# Patient Record
Sex: Male | Born: 1970 | Race: White | Hispanic: No | Marital: Single | Smoking: Current every day smoker
Health system: Southern US, Community
[De-identification: ages and names within clinical notes are randomized; demographics above are authoritative.]

## PROBLEM LIST (undated history)

## (undated) DIAGNOSIS — I10 Essential (primary) hypertension: Secondary | ICD-10-CM

## (undated) DIAGNOSIS — E119 Type 2 diabetes mellitus without complications: Secondary | ICD-10-CM

## (undated) DIAGNOSIS — F419 Anxiety disorder, unspecified: Secondary | ICD-10-CM

## (undated) HISTORY — PX: NASAL DILATION: SHX2064

## (undated) HISTORY — PX: ROTATOR CUFF REPAIR: SHX139

## (undated) HISTORY — PX: BACK SURGERY: SHX140

---

## 2014-03-30 ENCOUNTER — Other Ambulatory Visit: Payer: Self-pay | Admitting: Neurological Surgery

## 2014-04-11 ENCOUNTER — Encounter (HOSPITAL_COMMUNITY): Payer: Self-pay | Admitting: Pharmacy Technician

## 2014-04-13 ENCOUNTER — Encounter (HOSPITAL_COMMUNITY)
Admission: RE | Admit: 2014-04-13 | Discharge: 2014-04-13 | Disposition: A | Discharge status: Home / Self Care | Payer: BC Managed Care – PPO | Source: Ambulatory Visit | Attending: Neurological Surgery | Admitting: Neurological Surgery

## 2014-04-13 ENCOUNTER — Encounter (HOSPITAL_COMMUNITY): Payer: Self-pay

## 2014-04-13 DIAGNOSIS — Z01812 Encounter for preprocedural laboratory examination: Secondary | ICD-10-CM | POA: Insufficient documentation

## 2014-04-13 DIAGNOSIS — Z01810 Encounter for preprocedural cardiovascular examination: Secondary | ICD-10-CM | POA: Insufficient documentation

## 2014-04-13 HISTORY — DX: Type 2 diabetes mellitus without complications: E11.9

## 2014-04-13 LAB — BASIC METABOLIC PANEL
Anion gap: 12 (ref 5–15)
BUN: 9 mg/dL (ref 6–23)
CO2: 26 mEq/L (ref 19–32)
Calcium: 8.7 mg/dL (ref 8.4–10.5)
Chloride: 102 mEq/L (ref 96–112)
Creatinine, Ser: 0.69 mg/dL (ref 0.50–1.35)
GFR calc Af Amer: >90 mL/min (ref >90)
GFR calc non Af Amer: >90 mL/min (ref >90)
Glucose, Bld: 95 mg/dL (ref 70–99)
Potassium: 4.4 mEq/L (ref 3.7–5.3)
SODIUM: 140 meq/L (ref 137–147)

## 2014-04-13 LAB — SURGICAL PCR SCREEN
MRSA, PCR: NEGATIVE
Staphylococcus aureus: NEGATIVE

## 2014-04-13 LAB — CBC
HEMATOCRIT: 40.3 % (ref 39.0–52.0)
Hemoglobin: 13.5 g/dL (ref 13.0–17.0)
MCH: 33.9 pg (ref 26.0–34.0)
MCHC: 33.5 g/dL (ref 30.0–36.0)
MCV: 101.3 fL — ABNORMAL HIGH (ref 78.0–100.0)
Platelets: 214 10*3/uL (ref 150–400)
RBC: 3.98 MIL/uL — ABNORMAL LOW (ref 4.22–5.81)
RDW: 12.7 % (ref 11.5–15.5)
WBC: 10.1 10*3/uL (ref 4.0–10.5)

## 2014-04-13 LAB — TYPE AND SCREEN
ABO/RH(D): O POS
ANTIBODY SCREEN: NEGATIVE

## 2014-04-13 LAB — ABO/RH: ABO/RH(D): O POS

## 2014-04-13 NOTE — Pre-Procedure Instructions (Signed)
Tilden DomeJerry Zeitlin  04/13/2014   Your procedure is scheduled on: Tuesday, April 19, 2014  [facility] at 8:00 AM.  Call this number if you have problems the morning of surgery: [phone]   Remember:   Do not eat food or drink liquids after midnight Monday, April 18, 2014   Take these medicines the morning of surgery with A SIP OF WATER: PARoxetine (PAXIL) if needed:  acetaminophen (TYLENOL) for moderate pain.  Stop taking Aspirin, vitamins and herbal medications. Do not take any NSAIDs ie: Ibuprofen,  Advil,  Naproxen or any medication containing Aspirin; stop now.   Do not wear jewelry, make-up or nail polish.  Do not wear lotions, powders, or perfumes. You may wear deodorant.  Do not shave 48 hours prior to surgery. Men may shave face and neck.  Do not bring valuables to the hospital.  [facility] is not responsible for any belongings or valuables.               Contacts, dentures or bridgework may not be worn into surgery.  Leave suitcase in the car. After surgery it may be brought to your room.  For patients admitted to the hospital, discharge time is determined by your treatment team.               Patients discharged the day of surgery will not be allowed to drive home.  Name and phone number of your driver:   Special Instructions: Shower using CHG the night before surgery and the morning of surgery.   Please read over the following fact sheets that you were given: Pain Booklet, Coughing and Deep Breathing, Blood Transfusion Information, MRSA Information and Surgical Site Infection Prevention

## 2014-04-18 MED ORDER — CEFAZOLIN SODIUM-DEXTROSE 2-3 GM-% IV SOLR
2.0000 g | INTRAVENOUS | Status: AC
  Administered 2014-04-19 (×3): 2 g via INTRAVENOUS
  Filled 2014-04-18: qty 50

## 2014-04-19 ENCOUNTER — Encounter (HOSPITAL_COMMUNITY)
Admission: RE | Disposition: A | Discharge status: Home / Self Care | Payer: BC Managed Care – PPO | Source: Ambulatory Visit | Attending: Neurological Surgery

## 2014-04-19 ENCOUNTER — Encounter (HOSPITAL_COMMUNITY): Payer: Self-pay | Admitting: Surgery

## 2014-04-19 ENCOUNTER — Inpatient Hospital Stay (HOSPITAL_COMMUNITY): Payer: BC Managed Care – PPO | Admitting: Certified Registered"

## 2014-04-19 ENCOUNTER — Inpatient Hospital Stay (HOSPITAL_COMMUNITY): Payer: BC Managed Care – PPO

## 2014-04-19 ENCOUNTER — Encounter (HOSPITAL_COMMUNITY): Payer: BC Managed Care – PPO | Admitting: Certified Registered"

## 2014-04-19 ENCOUNTER — Inpatient Hospital Stay (HOSPITAL_COMMUNITY)
Admission: RE | Admit: 2014-04-19 | Discharge: 2014-04-23 | DRG: 460 | Disposition: A | Discharge status: Home / Self Care | Payer: BC Managed Care – PPO | Source: Ambulatory Visit | Attending: Neurological Surgery | Admitting: Neurological Surgery

## 2014-04-19 DIAGNOSIS — M5126 Other intervertebral disc displacement, lumbar region: Secondary | ICD-10-CM | POA: Diagnosis present

## 2014-04-19 DIAGNOSIS — F17200 Nicotine dependence, unspecified, uncomplicated: Secondary | ICD-10-CM | POA: Diagnosis present

## 2014-04-19 DIAGNOSIS — E119 Type 2 diabetes mellitus without complications: Secondary | ICD-10-CM | POA: Diagnosis present

## 2014-04-19 DIAGNOSIS — Q762 Congenital spondylolisthesis: Secondary | ICD-10-CM

## 2014-04-19 DIAGNOSIS — Z888 Allergy status to other drugs, medicaments and biological substances status: Secondary | ICD-10-CM

## 2014-04-19 DIAGNOSIS — I10 Essential (primary) hypertension: Secondary | ICD-10-CM | POA: Diagnosis present

## 2014-04-19 DIAGNOSIS — Z23 Encounter for immunization: Secondary | ICD-10-CM

## 2014-04-19 DIAGNOSIS — M47816 Spondylosis without myelopathy or radiculopathy, lumbar region: Secondary | ICD-10-CM | POA: Diagnosis present

## 2014-04-19 DIAGNOSIS — Z885 Allergy status to narcotic agent status: Secondary | ICD-10-CM

## 2014-04-19 DIAGNOSIS — M47817 Spondylosis without myelopathy or radiculopathy, lumbosacral region: Secondary | ICD-10-CM | POA: Diagnosis present

## 2014-04-19 HISTORY — DX: Essential (primary) hypertension: I10

## 2014-04-19 HISTORY — PX: POSTERIOR LUMBAR FUSION 4 LEVEL: SHX6037

## 2014-04-19 LAB — GLUCOSE, CAPILLARY: GLUCOSE-CAPILLARY: 157 mg/dL — AB (ref 70–99)

## 2014-04-19 SURGERY — POSTERIOR LUMBAR FUSION 4 LEVEL
Anesthesia: General | Site: Spine Lumbar

## 2014-04-19 MED ORDER — HYDROMORPHONE HCL PF 1 MG/ML IJ SOLN
0.2500 mg | INTRAMUSCULAR | Status: DC | PRN
  Administered 2014-04-19 (×4): 0.5 mg via INTRAVENOUS

## 2014-04-19 MED ORDER — LACTATED RINGERS IV SOLN
INTRAVENOUS | Status: DC
  Administered 2014-04-19 (×4): via INTRAVENOUS

## 2014-04-19 MED ORDER — KETOROLAC TROMETHAMINE 15 MG/ML IJ SOLN
15.0000 mg | Freq: Four times a day (QID) | INTRAMUSCULAR | Status: AC
  Administered 2014-04-19 – 2014-04-20 (×5): 15 mg via INTRAVENOUS
  Filled 2014-04-19 (×5): qty 1

## 2014-04-19 MED ORDER — ROCURONIUM BROMIDE 50 MG/5ML IV SOLN
INTRAVENOUS | Status: AC
  Filled 2014-04-19: qty 1

## 2014-04-19 MED ORDER — DOCUSATE SODIUM 100 MG PO CAPS
100.0000 mg | ORAL_CAPSULE | Freq: Two times a day (BID) | ORAL | Status: DC
  Administered 2014-04-19 – 2014-04-23 (×8): 100 mg via ORAL
  Filled 2014-04-19 (×9): qty 1

## 2014-04-19 MED ORDER — FENTANYL CITRATE 0.05 MG/ML IJ SOLN
INTRAMUSCULAR | Status: AC
  Filled 2014-04-19: qty 5

## 2014-04-19 MED ORDER — FENTANYL CITRATE 0.05 MG/ML IJ SOLN
INTRAMUSCULAR | Status: DC | PRN
Start: 2014-04-19 — End: 2014-04-19
  Administered 2014-04-19 (×2): 50 ug via INTRAVENOUS
  Administered 2014-04-19: 100 ug via INTRAVENOUS
  Administered 2014-04-19: 50 ug via INTRAVENOUS
  Administered 2014-04-19 (×2): 100 ug via INTRAVENOUS
  Administered 2014-04-19: 50 ug via INTRAVENOUS
  Administered 2014-04-19: 150 ug via INTRAVENOUS
  Administered 2014-04-19 (×2): 50 ug via INTRAVENOUS

## 2014-04-19 MED ORDER — CEFAZOLIN SODIUM 1-5 GM-% IV SOLN
1.0000 g | Freq: Three times a day (TID) | INTRAVENOUS | Status: AC
  Administered 2014-04-20 (×2): 1 g via INTRAVENOUS
  Filled 2014-04-19 (×2): qty 50

## 2014-04-19 MED ORDER — THROMBIN 20000 UNITS EX SOLR
CUTANEOUS | Status: DC | PRN
  Administered 2014-04-19: 13:00:00 via TOPICAL

## 2014-04-19 MED ORDER — PAROXETINE HCL 20 MG PO TABS
40.0000 mg | ORAL_TABLET | ORAL | Status: DC
  Administered 2014-04-20 – 2014-04-23 (×4): 40 mg via ORAL
  Filled 2014-04-19 (×5): qty 2

## 2014-04-19 MED ORDER — PROPOFOL 10 MG/ML IV BOLUS
INTRAVENOUS | Status: DC | PRN
  Administered 2014-04-19: 140 mg via INTRAVENOUS

## 2014-04-19 MED ORDER — SODIUM CHLORIDE 0.9 % IJ SOLN: 3.0000 mL | INTRAMUSCULAR | Status: DC | PRN

## 2014-04-19 MED ORDER — GLYCOPYRROLATE 0.2 MG/ML IJ SOLN
INTRAMUSCULAR | Status: DC | PRN
  Administered 2014-04-19: .9 mg via INTRAVENOUS

## 2014-04-19 MED ORDER — MIDAZOLAM HCL 2 MG/2ML IJ SOLN
INTRAMUSCULAR | Status: AC
  Filled 2014-04-19: qty 2

## 2014-04-19 MED ORDER — KETOROLAC TROMETHAMINE 30 MG/ML IJ SOLN
INTRAMUSCULAR | Status: AC
  Administered 2014-04-19: 30 mg
  Filled 2014-04-19: qty 1

## 2014-04-19 MED ORDER — POLYETHYLENE GLYCOL 3350 17 G PO PACK
17.0000 g | PACK | Freq: Every day | ORAL | Status: DC | PRN
  Filled 2014-04-19: qty 1

## 2014-04-19 MED ORDER — FLEET ENEMA 7-19 GM/118ML RE ENEM
1.0000 | ENEMA | Freq: Once | RECTAL | Status: AC | PRN
  Filled 2014-04-19: qty 1

## 2014-04-19 MED ORDER — LISINOPRIL 20 MG PO TABS
20.0000 mg | ORAL_TABLET | Freq: Every day | ORAL | Status: DC
  Administered 2014-04-19 – 2014-04-23 (×5): 20 mg via ORAL
  Filled 2014-04-19 (×5): qty 1

## 2014-04-19 MED ORDER — OXYCODONE HCL 5 MG/5ML PO SOLN: 5.0000 mg | Freq: Once | ORAL | Status: DC | PRN

## 2014-04-19 MED ORDER — ALBUMIN HUMAN 5 % IV SOLN
INTRAVENOUS | Status: DC | PRN
  Administered 2014-04-19 (×2): via INTRAVENOUS

## 2014-04-19 MED ORDER — VECURONIUM BROMIDE 10 MG IV SOLR
INTRAVENOUS | Status: AC
  Filled 2014-04-19: qty 10

## 2014-04-19 MED ORDER — PHENOL 1.4 % MT LIQD: 1.0000 | OROMUCOSAL | Status: DC | PRN

## 2014-04-19 MED ORDER — CEFAZOLIN SODIUM-DEXTROSE 2-3 GM-% IV SOLR
INTRAVENOUS | Status: AC
Start: 2014-04-19 — End: 2014-04-19
  Filled 2014-04-19: qty 50

## 2014-04-19 MED ORDER — OXYCODONE-ACETAMINOPHEN 5-325 MG PO TABS
1.0000 | ORAL_TABLET | ORAL | Status: DC | PRN
Start: 2014-04-19 — End: 2014-04-21
  Administered 2014-04-19 – 2014-04-21 (×7): 2 via ORAL
  Filled 2014-04-19 (×6): qty 2

## 2014-04-19 MED ORDER — ONDANSETRON HCL 4 MG/2ML IJ SOLN: 4.0000 mg | Freq: Once | INTRAMUSCULAR | Status: DC | PRN

## 2014-04-19 MED ORDER — ACETAMINOPHEN 325 MG PO TABS: 650.0000 mg | ORAL_TABLET | ORAL | Status: DC | PRN

## 2014-04-19 MED ORDER — PHENYLEPHRINE HCL 10 MG/ML IJ SOLN
INTRAMUSCULAR | Status: DC | PRN
  Administered 2014-04-19 (×3): 80 ug via INTRAVENOUS

## 2014-04-19 MED ORDER — CEFAZOLIN SODIUM-DEXTROSE 2-3 GM-% IV SOLR
INTRAVENOUS | Status: AC
  Filled 2014-04-19: qty 100

## 2014-04-19 MED ORDER — LISINOPRIL 20 MG PO TABS
20.0000 mg | ORAL_TABLET | Freq: Once | ORAL | Status: AC
Start: 2014-04-19 — End: 2014-04-19
  Administered 2014-04-19: 20 mg via ORAL
  Filled 2014-04-19: qty 1

## 2014-04-19 MED ORDER — METHOCARBAMOL 500 MG PO TABS
ORAL_TABLET | ORAL | Status: AC
  Filled 2014-04-19: qty 1

## 2014-04-19 MED ORDER — ONDANSETRON HCL 4 MG/2ML IJ SOLN
INTRAMUSCULAR | Status: AC
  Filled 2014-04-19: qty 2

## 2014-04-19 MED ORDER — VECURONIUM BROMIDE 10 MG IV SOLR
INTRAVENOUS | Status: DC | PRN
  Administered 2014-04-19 (×4): 2 mg via INTRAVENOUS

## 2014-04-19 MED ORDER — ALUM & MAG HYDROXIDE-SIMETH 200-200-20 MG/5ML PO SUSP: 30.0000 mL | Freq: Four times a day (QID) | ORAL | Status: DC | PRN

## 2014-04-19 MED ORDER — OXYCODONE HCL 5 MG PO TABS: 5.0000 mg | ORAL_TABLET | Freq: Once | ORAL | Status: DC | PRN

## 2014-04-19 MED ORDER — NEOSTIGMINE METHYLSULFATE 10 MG/10ML IV SOLN
INTRAVENOUS | Status: DC | PRN
  Administered 2014-04-19: 5 mg via INTRAVENOUS

## 2014-04-19 MED ORDER — METHOCARBAMOL 500 MG PO TABS
500.0000 mg | ORAL_TABLET | Freq: Four times a day (QID) | ORAL | Status: DC | PRN
  Administered 2014-04-19 – 2014-04-22 (×3): 500 mg via ORAL
  Filled 2014-04-19 (×6): qty 1

## 2014-04-19 MED ORDER — METHOCARBAMOL 1000 MG/10ML IJ SOLN
500.0000 mg | Freq: Four times a day (QID) | INTRAMUSCULAR | Status: DC | PRN
  Filled 2014-04-19: qty 5

## 2014-04-19 MED ORDER — ARTIFICIAL TEARS OP OINT
TOPICAL_OINTMENT | OPHTHALMIC | Status: AC
  Filled 2014-04-19: qty 3.5

## 2014-04-19 MED ORDER — PROPOFOL 10 MG/ML IV BOLUS
INTRAVENOUS | Status: AC
  Filled 2014-04-19: qty 20

## 2014-04-19 MED ORDER — HYDROMORPHONE HCL PF 1 MG/ML IJ SOLN
INTRAMUSCULAR | Status: AC
  Filled 2014-04-19: qty 1

## 2014-04-19 MED ORDER — OXYCODONE-ACETAMINOPHEN 5-325 MG PO TABS
ORAL_TABLET | ORAL | Status: AC
  Filled 2014-04-19: qty 2

## 2014-04-19 MED ORDER — SENNA 8.6 MG PO TABS
1.0000 | ORAL_TABLET | Freq: Two times a day (BID) | ORAL | Status: DC
  Administered 2014-04-19 – 2014-04-23 (×8): 8.6 mg via ORAL
  Filled 2014-04-19 (×9): qty 1

## 2014-04-19 MED ORDER — MEPERIDINE HCL 25 MG/ML IJ SOLN: 6.2500 mg | INTRAMUSCULAR | Status: DC | PRN

## 2014-04-19 MED ORDER — ARTIFICIAL TEARS OP OINT
TOPICAL_OINTMENT | OPHTHALMIC | Status: DC | PRN
  Administered 2014-04-19: 1 via OPHTHALMIC

## 2014-04-19 MED ORDER — ACETAMINOPHEN 650 MG RE SUPP: 650.0000 mg | RECTAL | Status: DC | PRN

## 2014-04-19 MED ORDER — THROMBIN 5000 UNITS EX SOLR
OROMUCOSAL | Status: DC | PRN
  Administered 2014-04-19 (×2): via TOPICAL

## 2014-04-19 MED ORDER — ONDANSETRON HCL 4 MG/2ML IJ SOLN: 4.0000 mg | INTRAMUSCULAR | Status: DC | PRN

## 2014-04-19 MED ORDER — DEXAMETHASONE SODIUM PHOSPHATE 4 MG/ML IJ SOLN
INTRAMUSCULAR | Status: DC | PRN
  Administered 2014-04-19: 10 mg via INTRAVENOUS

## 2014-04-19 MED ORDER — MENTHOL 3 MG MT LOZG
1.0000 | LOZENGE | OROMUCOSAL | Status: DC | PRN
Start: 2014-04-19 — End: 2014-04-23

## 2014-04-19 MED ORDER — BISACODYL 10 MG RE SUPP: 10.0000 mg | Freq: Every day | RECTAL | Status: DC | PRN

## 2014-04-19 MED ORDER — SODIUM CHLORIDE 0.9 % IV SOLN: 250.0000 mL | INTRAVENOUS | Status: DC

## 2014-04-19 MED ORDER — EPHEDRINE SULFATE 50 MG/ML IJ SOLN
INTRAMUSCULAR | Status: AC
  Filled 2014-04-19: qty 1

## 2014-04-19 MED ORDER — SODIUM CHLORIDE 0.9 % IR SOLN
Status: DC | PRN
  Administered 2014-04-19: 13:00:00

## 2014-04-19 MED ORDER — SODIUM CHLORIDE 0.9 % IV SOLN
INTRAVENOUS | Status: DC
  Administered 2014-04-19: 22:00:00 via INTRAVENOUS

## 2014-04-19 MED ORDER — BUPIVACAINE HCL (PF) 0.5 % IJ SOLN
INTRAMUSCULAR | Status: DC | PRN
  Administered 2014-04-19: 30 mL
  Administered 2014-04-19: 8 mL

## 2014-04-19 MED ORDER — 0.9 % SODIUM CHLORIDE (POUR BTL) OPTIME
TOPICAL | Status: DC | PRN
  Administered 2014-04-19: 1000 mL

## 2014-04-19 MED ORDER — LIDOCAINE HCL (CARDIAC) 20 MG/ML IV SOLN
INTRAVENOUS | Status: AC
  Filled 2014-04-19: qty 5

## 2014-04-19 MED ORDER — SODIUM CHLORIDE 0.9 % IV SOLN
INTRAVENOUS | Status: DC | PRN
  Administered 2014-04-19: 15:00:00 via INTRAVENOUS

## 2014-04-19 MED ORDER — ROCURONIUM BROMIDE 100 MG/10ML IV SOLN
INTRAVENOUS | Status: DC | PRN
  Administered 2014-04-19: 20 mg via INTRAVENOUS
  Administered 2014-04-19: 25 mg via INTRAVENOUS
  Administered 2014-04-19: 50 mg via INTRAVENOUS
  Administered 2014-04-19 (×2): 10 mg via INTRAVENOUS
  Administered 2014-04-19: 20 mg via INTRAVENOUS
  Administered 2014-04-19: 15 mg via INTRAVENOUS

## 2014-04-19 MED ORDER — HYDROMORPHONE HCL PF 1 MG/ML IJ SOLN
0.5000 mg | INTRAMUSCULAR | Status: DC | PRN
  Administered 2014-04-19: 1 mg via INTRAVENOUS
  Administered 2014-04-20: 0.5 mg via INTRAVENOUS
  Administered 2014-04-20 – 2014-04-23 (×8): 1 mg via INTRAVENOUS
  Filled 2014-04-19 (×11): qty 1

## 2014-04-19 MED ORDER — ONDANSETRON HCL 4 MG/2ML IJ SOLN
INTRAMUSCULAR | Status: DC | PRN
  Administered 2014-04-19: 4 mg via INTRAVENOUS

## 2014-04-19 MED ORDER — MIDAZOLAM HCL 5 MG/5ML IJ SOLN
INTRAMUSCULAR | Status: DC | PRN
  Administered 2014-04-19: 2 mg via INTRAVENOUS

## 2014-04-19 MED ORDER — PHENYLEPHRINE 40 MCG/ML (10ML) SYRINGE FOR IV PUSH (FOR BLOOD PRESSURE SUPPORT)
PREFILLED_SYRINGE | INTRAVENOUS | Status: AC
  Filled 2014-04-19: qty 10

## 2014-04-19 MED ORDER — LIDOCAINE HCL (CARDIAC) 20 MG/ML IV SOLN
INTRAVENOUS | Status: DC | PRN
  Administered 2014-04-19: 100 mg via INTRAVENOUS

## 2014-04-19 MED ORDER — SODIUM CHLORIDE 0.9 % IJ SOLN
3.0000 mL | Freq: Two times a day (BID) | INTRAMUSCULAR | Status: DC
  Administered 2014-04-20 – 2014-04-22 (×6): 3 mL via INTRAVENOUS

## 2014-04-19 MED ORDER — LIDOCAINE-EPINEPHRINE 1 %-1:100000 IJ SOLN
INTRAMUSCULAR | Status: DC | PRN
  Administered 2014-04-19: 8 mL

## 2014-04-19 SURGICAL SUPPLY — 79 items
BAG DECANTER FOR FLEXI CONT (MISCELLANEOUS) ×3 IMPLANT
BLADE 10 SAFETY STRL DISP (BLADE) IMPLANT
BLADE SURG ROTATE 9660 (MISCELLANEOUS) IMPLANT
BONE MATRIX OSTEOCEL PRO MED (Bone Implant) ×12 IMPLANT
BUR MATCHSTICK NEURO 3.0 LAGG (BURR) ×3 IMPLANT
CAGE COROENT LG 10X9X23-12 (Cage) ×6 IMPLANT
CAGE COROENT LG 12X9X23-12 (Cage) ×6 IMPLANT
CAGE COROENT PLIF 10X28-8 LUMB (Cage) ×6 IMPLANT
CANISTER SUCT 3000ML (MISCELLANEOUS) ×3 IMPLANT
CONNECTOR ROD-ROD 5.5H-5.5 (Connector) ×6 IMPLANT
CONT SPEC 4OZ CLIKSEAL STRL BL (MISCELLANEOUS) ×6 IMPLANT
COVER BACK TABLE 24X17X13 BIG (DRAPES) IMPLANT
COVER TABLE BACK 60X90 (DRAPES) ×3 IMPLANT
DECANTER SPIKE VIAL GLASS SM (MISCELLANEOUS) ×3 IMPLANT
DERMABOND ADVANCED (GAUZE/BANDAGES/DRESSINGS) ×2
DERMABOND ADVANCED .7 DNX12 (GAUZE/BANDAGES/DRESSINGS) ×1 IMPLANT
DRAPE C-ARM 42X72 X-RAY (DRAPES) ×6 IMPLANT
DRAPE LAPAROTOMY 100X72X124 (DRAPES) ×3 IMPLANT
DRAPE POUCH INSTRU U-SHP 10X18 (DRAPES) ×3 IMPLANT
DRAPE PROXIMA HALF (DRAPES) IMPLANT
DRSG OPSITE POSTOP 4X10 (GAUZE/BANDAGES/DRESSINGS) ×3 IMPLANT
DURAPREP 26ML APPLICATOR (WOUND CARE) ×3 IMPLANT
ELECT REM PT RETURN 9FT ADLT (ELECTROSURGICAL) ×3
ELECTRODE REM PT RTRN 9FT ADLT (ELECTROSURGICAL) ×1 IMPLANT
GAUZE SPONGE 4X4 16PLY XRAY LF (GAUZE/BANDAGES/DRESSINGS) IMPLANT
GLOVE BIO SURGEON STRL SZ 6.5 (GLOVE) ×2 IMPLANT
GLOVE BIO SURGEON STRL SZ8 (GLOVE) ×3 IMPLANT
GLOVE BIO SURGEONS STRL SZ 6.5 (GLOVE) ×1
GLOVE BIOGEL PI IND STRL 7.0 (GLOVE) ×2 IMPLANT
GLOVE BIOGEL PI IND STRL 7.5 (GLOVE) ×2 IMPLANT
GLOVE BIOGEL PI IND STRL 8.5 (GLOVE) ×3 IMPLANT
GLOVE BIOGEL PI INDICATOR 7.0 (GLOVE) ×4
GLOVE BIOGEL PI INDICATOR 7.5 (GLOVE) ×4
GLOVE BIOGEL PI INDICATOR 8.5 (GLOVE) ×6
GLOVE ECLIPSE 7.5 STRL STRAW (GLOVE) ×9 IMPLANT
GLOVE ECLIPSE 8.5 STRL (GLOVE) ×9 IMPLANT
GLOVE EXAM NITRILE LRG STRL (GLOVE) IMPLANT
GLOVE EXAM NITRILE MD LF STRL (GLOVE) IMPLANT
GLOVE EXAM NITRILE XL STR (GLOVE) IMPLANT
GLOVE EXAM NITRILE XS STR PU (GLOVE) IMPLANT
GLOVE SURG SS PI 7.0 STRL IVOR (GLOVE) ×9 IMPLANT
GOWN STRL REUS W/ TWL LRG LVL3 (GOWN DISPOSABLE) ×2 IMPLANT
GOWN STRL REUS W/ TWL XL LVL3 (GOWN DISPOSABLE) ×3 IMPLANT
GOWN STRL REUS W/TWL 2XL LVL3 (GOWN DISPOSABLE) ×6 IMPLANT
GOWN STRL REUS W/TWL LRG LVL3 (GOWN DISPOSABLE) ×4
GOWN STRL REUS W/TWL XL LVL3 (GOWN DISPOSABLE) ×6
HEMOSTAT POWDER KIT SURGIFOAM (HEMOSTASIS) ×3 IMPLANT
KIT BASIN OR (CUSTOM PROCEDURE TRAY) ×3 IMPLANT
KIT ROOM TURNOVER OR (KITS) ×3 IMPLANT
NEEDLE HYPO 22GX1.5 SAFETY (NEEDLE) ×3 IMPLANT
NS IRRIG 1000ML POUR BTL (IV SOLUTION) ×3 IMPLANT
PACK LAMINECTOMY NEURO (CUSTOM PROCEDURE TRAY) ×3 IMPLANT
PAD ARMBOARD 7.5X6 YLW CONV (MISCELLANEOUS) ×15 IMPLANT
PATTIES SURGICAL .5 X1 (DISPOSABLE) ×3 IMPLANT
ROD PREBENT LATERAL OFFSET 5.5 (Rod) ×6 IMPLANT
SCREW ARMADA 6.5X50 (Screw) ×6 IMPLANT
SCREW LOCK (Screw) ×16 IMPLANT
SCREW LOCK 100X5.5X OPN (Screw) ×8 IMPLANT
SCREW LOCK ARMT15T ILIAC (Screw) ×12 IMPLANT
SCREW POLY 45X6.5 (Screw) ×6 IMPLANT
SCREW POLY 6.5X45MM (Screw) ×12 IMPLANT
SEALER BIPOLAR AQUA 6.0 (INSTRUMENTS) ×3 IMPLANT
SPONGE GAUZE 4X4 12PLY (GAUZE/BANDAGES/DRESSINGS) IMPLANT
SPONGE LAP 4X18 X RAY DECT (DISPOSABLE) ×12 IMPLANT
SPONGE NEURO XRAY DETECT 1X3 (DISPOSABLE) ×3 IMPLANT
SPONGE SURGIFOAM ABS GEL 100 (HEMOSTASIS) ×3 IMPLANT
SUT PROLENE 6 0 BV (SUTURE) ×6 IMPLANT
SUT VIC AB 1 CT1 18XBRD ANBCTR (SUTURE) ×2 IMPLANT
SUT VIC AB 1 CT1 8-18 (SUTURE) ×4
SUT VIC AB 2-0 CP2 18 (SUTURE) ×9 IMPLANT
SUT VIC AB 3-0 SH 8-18 (SUTURE) ×6 IMPLANT
SYR 20ML ECCENTRIC (SYRINGE) ×3 IMPLANT
SYR 3ML LL SCALE MARK (SYRINGE) ×12 IMPLANT
SYR CONTROL 10ML LL (SYRINGE) ×3 IMPLANT
TOWEL OR 17X24 6PK STRL BLUE (TOWEL DISPOSABLE) ×3 IMPLANT
TOWEL OR 17X26 10 PK STRL BLUE (TOWEL DISPOSABLE) ×3 IMPLANT
TRAP SPECIMEN MUCOUS 40CC (MISCELLANEOUS) ×3 IMPLANT
TRAY FOLEY CATH 14FRSI W/METER (CATHETERS) ×3 IMPLANT
WATER STERILE IRR 1000ML POUR (IV SOLUTION) ×3 IMPLANT

## 2014-04-19 NOTE — H&P (Signed)
Devin [name] is an 43 y.o. male.   Chief Complaint: Back and leg pain HPI: Patient is a 43 year old individual is had a previous work-related injury having sustained a thoracic lumbar burst fracture. He underwent surgical decompression and fusion from L2 to T. 10. The patient is having worsening back pain and leg pain and after a more recent episode he has had persistent dysfunction in his back and both legs. He is now being admitted to undergo surgical decompression and stabilization from L2 to the sacrum. He is failed all manner of conservative effort.  Patient has had a number of epidural steroid injections extensive physical therapy he has required increasing doses of narcotic pain medication to maintain some level of function. 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 He is now to undergo surgical decompression and stabilization from L2 to the sacrum  Past Medical History  Diagnosis Date  . Diabetes mellitus without complication     diet contolled  only  . Hypertension     Past Surgical History  Procedure Laterality Date  . Back surgery      x2 surgeries  . Nasal dilation    . Rotator cuff repair      History reviewed. No pertinent family history. Social History:  reports that he has been smoking.  He does not have any smokeless tobacco history on file. He reports that he drinks alcohol. He reports that he does not use illicit drugs.  Allergies:  Allergies  Allergen Reactions  . Codeine     Skin "crawl"  . Hydrocodone     Skin "crawl"  . Other Nausea Only    Darvocet    Medications Prior to Admission  Medication Sig Dispense Refill  . acetaminophen (TYLENOL) 500 MG tablet Take 1,500 mg by mouth every 6 (six) hours as needed for moderate pain.      Marland Kitchen lisinopril  (PRINIVIL,ZESTRIL) 20 MG tablet Take 20 mg by mouth daily.      Marland Kitchen PARoxetine (PAXIL) 40 MG tablet Take 40 mg by mouth every morning.        No results found for this or any previous visit (from the past 48 hour(s)). Dg Chest 2 View  04/19/2014   CLINICAL DATA:  Preop lumbar fusion.  EXAM: CHEST  2 VIEW  COMPARISON:  None.  FINDINGS: The heart is within normal limits in size. The mediastinal and hilar contours are normal. The lungs are clear. No pleural effusion. The bony thorax is intact. Remote right rib trauma is noted. Thoracolumbar fusion hardware noted.  IMPRESSION: No acute cardiopulmonary findings.   Electronically Signed   By: Loralie Champagne M.D.   On: 04/19/2014 08:42    Review of Systems  Constitutional: Negative.   HENT: Negative.   Eyes: Negative.   Cardiovascular: Negative.   Gastrointestinal: Negative.   Genitourinary: Negative.   Musculoskeletal: Positive for back pain.  Skin: Negative.   Neurological:       Bilateral lower extremity dysfunction with radicular pain left worse than right  Endo/Heme/Allergies: Negative.   Psychiatric/Behavioral: Negative.     Blood pressure 161/103, pulse 86, temperature 98.2 F (36.8 C), temperature source Oral, resp. rate 20, weight 111.131 kg (245 lb), SpO2 100.00%. Physical Exam  Constitutional: He is oriented to person, place, and time. He appears  well-developed and well-nourished.  HENT:  Head: Normocephalic and atraumatic.  Eyes: Conjunctivae and EOM are normal. Pupils are equal, round, and reactive to light.  Neck: Normal range of motion. Neck supple.  Cardiovascular: Normal rate and regular rhythm.   Respiratory: Effort normal and breath sounds normal.  GI: Soft. Bowel sounds are normal.  Musculoskeletal:  Paraspinous spasm in both sides of his lumbar spine. Limited range of motion in flexion extension.  Neurological: He is alert and oriented to person, place, and time.  Mild dorsi flexor weakness left more than right.  Absent reflexes in both  Skin: Skin is warm and dry.  Psychiatric: He has a normal mood and affect. His behavior is normal. Judgment and thought content normal.     Assessment/Plan Devin [name] is admitted today having had a recent MRI of the lumbar spine.  Devin [name] had a study performed in July of last year after his work-related incident.  At that time, we noticed that he had some spondylitic changes mostly at L4-5 off to the right side where he had some evidence of previous laminotomy and foraminotomy years ago.  Though a disc at L4-5 had substantial bulge and lateral recess stenosis.  L5-S1 demonstrated a modest spondylolisthesis with broad-based bulging of that.  The current study now demonstrates that he has developed two new disc protrusions at L2-3 with a large left-sided disc herniation at L3-4 of the slightly smaller left-sided disc protrusion and herniation also.  There is remarkable lateral recess stenosis on the left side at L2-3 and L3-4.  This is immediately below his T10 to L2 fusion.  This last problem happened rather spontaneously at work after he had a fairly successful intervention with an intradiscal steroid injection.  The fact that this has gotten considerably worsen and is now changed from the previous study suggest that these two discs will need to be decompressed and stabilized.  In light of the fact that he already has advanced degenerative changes at L4-5 and spondylolisthesis at L5-S1.  I believe that ultimately Devin [name] will require surgical decompression and stabilization from L2 to the sacrum.  This is a substantial undertaking.  It will require revision of the posterior hardware that he already has from his previous L1 surgery with extension down to the sacrum.  This will solidity his lower lumbar spine.  Hopefully though with a good decompression, he will have much less in the way of leg pain and be functionally better are be at his back will be stiffer anatomically.  I  indicated degenerative surgery itself takes at least six hours to do.  He will likely be in the hospital for about a weeks' time.  He will require a corset to be up and about, but hopefully his time passes since the pain should lessen on a narcotic medication that he has been using considerably.  At this point, it should lessen also generally like that people weaned off narcotic pain medication about two months after the surgical intervention.  I do not believe that lesser surgery that is limited decompressions of L2-3 or L3-4 are likely to give him substantial relief given the nature of the degenerative process that he is experiencing.  Ultimately, I believe that Devin [name] will require surgical decompression and stabilization on L2 to the sacrum. Devin [name] remains out of work at the current time.   Anicka Stuckert J 04/19/2014, 10:27 AM

## 2014-04-19 NOTE — Progress Notes (Signed)
Patient ID: [name], male   DOB: 04/01/1971, 43 y.o.   MRN: [id number] Alert post op  Motor function intact.  Fair pain control.

## 2014-04-19 NOTE — Anesthesia Postprocedure Evaluation (Signed)
  Anesthesia Post-op Note  Patient: Tilden DomeJerry Guilford  Procedure(s) Performed: Procedure(s): LUMBAR TWO-THREE,LUMBAR THREE-FOUR,LUMBAR FOUR-FIVE,LUMBAR FIVE-SACRAL-ONE POSTERIOR LUMBAR INTERBODY FUSION/ADD ON TO THORACIC TEN-LUMBAT TWO FUSION (N/A)  Patient Location: PACU  Anesthesia Type:General  Level of Consciousness: awake, alert  and oriented  Airway and Oxygen Therapy: Patient Spontanous Breathing and Patient connected to nasal cannula oxygen  Post-op Pain: mild  Post-op Assessment: Post-op Vital signs reviewed, Patient's Cardiovascular Status Stable, Respiratory Function Stable, Patent Airway and Pain level controlled  Post-op Vital Signs: stable  Last Vitals:  Filed Vitals:   04/19/14 2047  BP:   Pulse: 93  Temp:   Resp: 9    Complications: No apparent anesthesia complications

## 2014-04-19 NOTE — Progress Notes (Signed)
Debbie, RN called Dr. Michelle Piperssey and informed him that patients blood pressure was 170/112 in right arm and it was 161/103 in left arm. Patient informed Primary Nurse that he was prescribed to take 20 mg of Lisinopril, but he has been "doubling up" and taking 40 mg daily because he has been checking his blood pressure at home and it has been elevated. However patient has not been taking Lisinopril because he can not afford it at this time. Debbie, RN also informed Dr. Michelle Piperssey of patients EKG. Dr. Michelle Piperssey ordered for patient to have 20 mg of Lisinopril PO. Will enter orders and administer.

## 2014-04-19 NOTE — Transfer of Care (Signed)
Immediate Anesthesia Transfer of Care Note  Patient: Devin [name]  Procedure(s) Performed: Procedure(s): LUMBAR TWO-THREE,LUMBAR THREE-FOUR,LUMBAR FOUR-FIVE,LUMBAR FIVE-SACRAL-ONE POSTERIOR LUMBAR INTERBODY FUSION/ADD ON TO THORACIC TEN-LUMBAT TWO FUSION (N/A)  Patient Location: PACU  Anesthesia Type:General  Level of Consciousness: awake, alert  and oriented  Airway & Oxygen Therapy: Patient Spontanous Breathing and Patient connected to nasal cannula oxygen  Post-op Assessment: Report given to PACU RN and Post -op Vital signs reviewed and stable  Post vital signs: Reviewed and stable  Complications: No apparent anesthesia complications

## 2014-04-19 NOTE — Anesthesia Procedure Notes (Addendum)
Procedure Name: Intubation Date/Time: 04/19/2014 10:46 AM Performed by: Jerilee HohMUMM, Larkyn Greenberger N Pre-anesthesia Checklist: Patient identified, Emergency Drugs available, Suction available, Patient being monitored and Timeout performed Patient Re-evaluated:Patient Re-evaluated prior to inductionOxygen Delivery Method: Circle system utilized Preoxygenation: Pre-oxygenation with 100% oxygen Intubation Type: IV induction Ventilation: Mask ventilation without difficulty and Oral airway inserted - appropriate to patient size Laryngoscope Size: Mac and 3 Grade View: Grade I Tube type: Oral Tube size: 7.5 mm Number of attempts: 1 Airway Equipment and Method: Stylet and Oral airway Placement Confirmation: ETT inserted through vocal cords under direct vision,  positive ETCO2 and breath sounds checked- equal and bilateral Secured at: 22 cm Tube secured with: Tape Dental Injury: Teeth and Oropharynx as per pre-operative assessment

## 2014-04-19 NOTE — Anesthesia Preprocedure Evaluation (Signed)
Anesthesia Evaluation  Patient identified by MRN, date of birth, ID band Patient awake    Reviewed: Allergy & Precautions, H&P , NPO status   Airway       Dental   Pulmonary Current Smoker,          Cardiovascular hypertension, Pt. on medications     Neuro/Psych    GI/Hepatic   Endo/Other  diabetes, Type 2  Renal/GU      Musculoskeletal   Abdominal   Peds  Hematology   Anesthesia Other Findings   Reproductive/Obstetrics                           Anesthesia Physical Anesthesia Plan  ASA: II  Anesthesia Plan: General   Post-op Pain Management:    Induction: Intravenous  Airway Management Planned: Oral ETT  Additional Equipment:   Intra-op Plan:   Post-operative Plan: Extubation in OR  Informed Consent: I have reviewed the patients History and Physical, chart, labs and discussed the procedure including the risks, benefits and alternatives for the proposed anesthesia with the patient or authorized representative who has indicated his/her understanding and acceptance.     Plan Discussed with: CRNA and Surgeon  Anesthesia Plan Comments:         Anesthesia Quick Evaluation

## 2014-04-19 NOTE — Pre-Procedure Instructions (Signed)
Devin DomeJerry [name]  04/19/2014   Your procedure is scheduled on: Thursday, April 21, 2014  [facility] at 5:30 AM  Call this number if you have problems the morning of surgery: [phone]   Remember:   Do not eat food or drink liquids after midnight Wednesday, April 20, 2014    Take these medicines the morning of surgery with A SIP OF WATER: PARoxetine (PAXIL) if needed:  acetaminophen (TYLENOL) for moderate pain.   Stop taking Aspirin, vitamins and herbal medications. Do not take any NSAIDs ie: Ibuprofen,  Advil,  Naproxen or any medication containing Aspirin; stop now.   Do not wear jewelry, no watches, or rings.   Do not wear lotions or colognes.  You may NOT wear deodorant.   Men may shave face and neck.  Do not bring valuables to the hospital.  [facility] for any belongings or valuables.               Contacts, dentures or bridgework may not be worn into surgery.  Leave suitcase in the car. After surgery it may be brought to your room.  For patients admitted to the hospital, discharge time is determined by your treatment team.               Patients discharged the day of surgery will not be allowed to drive home.   Name and phone number of your driver:    Special Instructions: Shower using CHG the night before surgery and the morning of surgery.   Please read over the following fact sheets that you were given: Pain Booklet, Coughing and Deep Breathing, Blood Transfusion Information, MRSA Information and Surgical Site Infection Prevention

## 2014-04-20 LAB — CBC
HCT: 32.1 % — ABNORMAL LOW (ref 39.0–52.0)
HEMOGLOBIN: 10.6 g/dL — AB (ref 13.0–17.0)
MCH: 34.2 pg — AB (ref 26.0–34.0)
MCHC: 33 g/dL (ref 30.0–36.0)
MCV: 103.5 fL — ABNORMAL HIGH (ref 78.0–100.0)
PLATELETS: 230 10*3/uL (ref 150–400)
RBC: 3.1 MIL/uL — AB (ref 4.22–5.81)
RDW: 13.2 % (ref 11.5–15.5)
WBC: 14.1 10*3/uL — ABNORMAL HIGH (ref 4.0–10.5)

## 2014-04-20 LAB — BASIC METABOLIC PANEL
ANION GAP: 13 (ref 5–15)
BUN: 15 mg/dL (ref 6–23)
CALCIUM: 8 mg/dL — AB (ref 8.4–10.5)
CO2: 23 meq/L (ref 19–32)
Chloride: 106 mEq/L (ref 96–112)
Creatinine, Ser: 1.23 mg/dL (ref 0.50–1.35)
GFR calc Af Amer: 81 mL/min — ABNORMAL LOW (ref >90)
GFR calc non Af Amer: 70 mL/min — ABNORMAL LOW (ref >90)
GLUCOSE: 114 mg/dL — AB (ref 70–99)
POTASSIUM: 5.1 meq/L (ref 3.7–5.3)
SODIUM: 142 meq/L (ref 137–147)

## 2014-04-20 LAB — GLUCOSE, CAPILLARY
GLUCOSE-CAPILLARY: 115 mg/dL — AB (ref 70–99)
GLUCOSE-CAPILLARY: 138 mg/dL — AB (ref 70–99)
Glucose-Capillary: 106 mg/dL — ABNORMAL HIGH (ref 70–99)
Glucose-Capillary: 135 mg/dL — ABNORMAL HIGH (ref 70–99)

## 2014-04-20 NOTE — Progress Notes (Signed)
PT Cancellation Note  Patient Details Name: Tilden DomeJerry Caselli MRN: [id number] DOB: 06/21/1971   Cancelled Treatment:    Reason Eval/Treat Not Completed: Patient not medically ready (no brace available at this time) attempted x2 this am, no brace available at this time, Will see once brace available.   Fabio AsaWerner, Cordella Nyquist J 04/20/2014, 11:54 AM Charlotte Crumbevon Donyelle Enyeart, PT DPT  [phone]

## 2014-04-20 NOTE — Progress Notes (Signed)
OT Cancellation Note  Patient Details Name: Devin [name] MRN: [id number] DOB: 04/10/1971   Cancelled Treatment:    Reason Eval/Treat Not Completed: Other (comment) (Awaiting brace to assess needs. )  [facility],HILLARY Valeriano Bain, OTR/L  [phone] 04/20/2014 04/20/2014, 3:29 PM

## 2014-04-20 NOTE — Clinical Social Work Note (Signed)
Clinical Social Worker received standing order referral for possible ST-SNF placement.  Chart reviewed.  PT/OT unable to work with patient at this time due to no brace available.  Spoke with RN who states patient is expressing concerns regarding access to medications at discharge - RN to notify CM.      CSW signing off - please re consult if social work needs, including placement arise.  Macario GoldsJesse Landis Dowdy, [place]CS[place] [phone]

## 2014-04-20 NOTE — Progress Notes (Signed)
Patient ID: Devin DomeJerry [name], male   DOB: 06/09/1971, 43 y.o.   MRN: [id number] Vital signs are stable. Postop hemoglobin 10.6. Electrolytes are okay. Has some tightness and right leg but otherwise legs feel good. Back has typical mild soreness. Dressing dry Awaiting brace KVO IV fluid, mobilize when brace arrives.

## 2014-04-20 NOTE — Progress Notes (Signed)
UR completed.  Dorinda Stehr, RN BSN [facility] CCM Trauma/Neuro ICU Case Manager [phone]  

## 2014-04-21 ENCOUNTER — Encounter (HOSPITAL_COMMUNITY): Payer: Self-pay | Admitting: Neurological Surgery

## 2014-04-21 LAB — GLUCOSE, CAPILLARY
GLUCOSE-CAPILLARY: 111 mg/dL — AB (ref 70–99)
GLUCOSE-CAPILLARY: 89 mg/dL (ref 70–99)
Glucose-Capillary: 85 mg/dL (ref 70–99)

## 2014-04-21 MED ORDER — OXYCODONE-ACETAMINOPHEN 5-325 MG PO TABS
1.0000 | ORAL_TABLET | ORAL | Status: DC | PRN
  Administered 2014-04-21 – 2014-04-23 (×8): 1 via ORAL
  Filled 2014-04-21 (×8): qty 1

## 2014-04-21 MED ORDER — OXYCODONE-ACETAMINOPHEN 5-325 MG PO TABS
1.0000 | ORAL_TABLET | ORAL | Status: DC | PRN
  Administered 2014-04-21: 2 via ORAL
  Filled 2014-04-21: qty 3

## 2014-04-21 MED ORDER — OXYCODONE HCL 5 MG PO TABS
5.0000 mg | ORAL_TABLET | ORAL | Status: DC | PRN
  Administered 2014-04-21 – 2014-04-23 (×9): 10 mg via ORAL
  Filled 2014-04-21 (×9): qty 2

## 2014-04-21 MED FILL — Heparin Sodium (Porcine) Inj 1000 Unit/ML: INTRAMUSCULAR | Qty: 30 | Status: AC

## 2014-04-21 MED FILL — Sodium Chloride Irrigation Soln 0.9%: Qty: 1000 | Status: AC

## 2014-04-21 MED FILL — Sodium Chloride IV Soln 0.9%: INTRAVENOUS | Qty: 1000 | Status: AC

## 2014-04-21 NOTE — Evaluation (Signed)
Physical Therapy Evaluation Patient Details Name: Devin [name] MRN: [id number] DOB: 06/02/1971 Today's Date: 04/21/2014   History of Present Illness  Patient is a 43 year old individual is had a previous work-related injury having sustained a thoracic lumbar burst fracture. He underwent surgical decompression and fusion from L2 to T. 10.   Clinical Impression  Patient demonstrates deficits in functional mobility as indicated below. Will benefit from continued skilled PT to address deficits and maximize function. Will see as indicated and progress as tolerated.    Follow Up Recommendations No PT follow up    Equipment Recommendations  None recommended by PT    Recommendations for Other Services       Precautions / Restrictions Precautions Precautions: Back Precaution Booklet Issued: No Required Braces or Orthoses: Spinal Brace Spinal Brace: Lumbar corset Restrictions Weight Bearing Restrictions: No      Mobility  Bed Mobility Overal bed mobility: Needs Assistance Bed Mobility: Sidelying to Sit;Rolling Rolling: Supervision Sidelying to sit: Supervision       General bed mobility comments: VCs for technique and sequencing  Transfers Overall transfer level: Needs assistance Equipment used: Rolling walker (2 wheeled) Transfers: Sit to/from Stand Sit to Stand: Supervision         General transfer comment: VCs for hand placement and safety  Ambulation/Gait Ambulation/Gait assistance: Supervision Ambulation Distance (Feet): 170 Feet Assistive device: Rolling walker (2 wheeled) Gait Pattern/deviations: Step-through pattern;Trunk flexed Gait velocity: decreased Gait velocity interpretation: Below normal speed for age/gender General Gait Details: VCs for upright posture  Stairs            Wheelchair Mobility    Modified Rankin (Stroke Patients Only)       Balance Overall balance assessment: No apparent balance deficits (not formally assessed)                                            Pertinent Vitals/Pain 6/10    Home Living Family/patient expects to be discharged to:: Private residence Living Arrangements: Alone Available Help at Discharge: Family;Friend(s) Type of Home: House Home Access: Stairs to enter Entrance Stairs-Rails: Can reach both Entrance Stairs-Number of Steps: 4 Home Layout: One level Home Equipment: None Additional Comments: tub shower with curtain, standard toilets    Prior Function Level of Independence: Independent               Hand Dominance   Dominant Hand: Right    Extremity/Trunk Assessment   Upper Extremity Assessment: Overall WFL for tasks assessed           Lower Extremity Assessment: Overall WFL for tasks assessed         Communication   Communication: No difficulties  Cognition Arousal/Alertness: Awake/alert Behavior During Therapy: WFL for tasks assessed/performed Overall Cognitive Status: Within Functional Limits for tasks assessed                      General Comments General comments (skin integrity, edema, etc.): educated patient on back precautions, mobility expectations, safety, positional changes and expectations for discharge. Teach back reinforcement of how to don brace    Exercises        Assessment/Plan    PT Assessment Patient needs continued PT services  PT Diagnosis Difficulty walking;Abnormality of gait;Acute pain   PT Problem List Decreased activity tolerance;Decreased balance;Decreased mobility;Pain  PT Treatment Interventions DME instruction;Gait training;Stair training;Functional mobility training;Therapeutic activities;Therapeutic  exercise;Balance training;Patient/family education   PT Goals (Current goals can be found in the Care Plan section) Acute Rehab PT Goals Patient Stated Goal: to go home PT Goal Formulation: No goals set, d/c therapy Time For Goal Achievement: 05/05/14 Potential to Achieve Goals: Good     Frequency Min 5X/week   Barriers to discharge        Co-evaluation               End of Session Equipment Utilized During Treatment: Gait belt;Back brace Activity Tolerance: Patient tolerated treatment well Patient left: in chair;with call bell/phone within reach Nurse Communication: Mobility status         Time: 1610-9604 PT Time Calculation (min): 26 min   Charges:   PT Evaluation $Initial PT Evaluation Tier I: 1 Procedure PT Treatments $Gait Training: 8-22 mins $Self Care/Home Management: 8-22   PT G CodesFabio Asa 04/21/2014, 12:10 PM Charlotte Crumb, PT DPT  [phone]

## 2014-04-21 NOTE — Plan of Care (Signed)
Problem: Phase I Progression Outcomes Goal: Pain controlled with appropriate interventions Outcome: Progressing Increased Percocet to 5-49m q 3 hours because 5-180mq 4 was not giving adequate coverage.  Have IV dilaudid, 0.5 - 1.0 mg. q 2 as well. Goal: OOB as tolerated unless otherwise ordered Outcome: Completed/Met Date Met:  04/21/14 PT worked with patient today.  Must wear back brace when OOB.  Problem: Phase II Progression Outcomes Goal: Tolerating diet Outcome: Completed/Met Date Met:  04/21/14 Heart healthy, carb mod, vegetarian.  Good appetite. Goal: PT/OT consults completed Outcome: Completed/Met Date Met:  04/21/14 Met with patient 7.23 at 09:45 for initial consult

## 2014-04-21 NOTE — Progress Notes (Signed)
Patient ID: [name], male   DOB: 06/04/1971, 43 y.o.   MRN: [id number] Vital signs stable Dressing changed on back Incision is clean and dry 1 small area of bleedthrough Pain incision with Betadine, dry sterile dressing applied. We'll transfer to 4 N. Increase pain medication

## 2014-04-21 NOTE — Progress Notes (Addendum)
Heard from CSW that pt had a need for medication assistance. Went to unit to meet with pt but therapies went into room to begin evaluations with patient. Notified bedside RN that I would check on patient this afternoon.  He does have [employer] coverage listed in system so we need to determine if he actually has the coverage or not.   Update: 1004am--The account notes in EPIC indicate that the patient's admission has been authorized which would indicate that pt does have active insurance with [employer].

## 2014-04-21 NOTE — Progress Notes (Signed)
Occupational Therapy Evaluation Patient Details Name: Tilden DomeJerry Fiorenza MRN: [id number] DOB: 10/20/1970 Today's Date: 04/21/2014    History of Present Illness Patient is a 43 year old individual is had a previous work-related injury having sustained a thoracic lumbar burst fracture. He underwent surgical decompression and fusion from T10-S2.   Clinical Impression   PTA, pt independent with ADL and mobility and lived alone. Began education regarding back precautions with use of compensatory techniques and AE/DME. Will see again in am to review. Due to limited caregiver support, rec HHOT at D/C. Pt will need 3 in 1.     Follow Up Recommendations  Home health OT;Supervision - Intermittent    Equipment Recommendations  3 in 1 bedside comode    Recommendations for Other Services       Precautions / Restrictions Precautions Precautions: Back Precaution Booklet Issued: Yes (comment) Required Braces or Orthoses: Spinal Brace Spinal Brace: Lumbar corset Restrictions Weight Bearing Restrictions: No      Mobility Bed Mobility Overal bed mobility: Needs Assistance Bed Mobility: Sidelying to Sit;Rolling Rolling: Supervision Sidelying to sit: Supervision       General bed mobility comments: VCs for technique and sequencing  Transfers Overall transfer level: Needs assistance Equipment used: 1 person hand held assist Transfers: Sit to/from UGI CorporationStand;Stand Pivot Transfers Sit to Stand: Min guard Stand pivot transfers: Min guard       General transfer comment: VCs for hand placement and safety    Balance Overall balance assessment: No apparent balance deficits (not formally assessed)                                          ADL Overall ADL's : Needs assistance/impaired     Grooming: Set up;Supervision/safety   Upper Body Bathing: Set up;Sitting   Lower Body Bathing: Moderate assistance;Sit to/from stand   Upper Body Dressing : Sitting;Set up   Lower Body  Dressing: Moderate assistance;Sit to/from stand   Toilet Transfer: LawyerMin guard   Toileting- Clothing Manipulation and Hygiene: Moderate assistance       Functional mobility during ADLs: Min guard General ADL Comments: Educated pt on AE for ADL and compensatory techniques. Gave handout for back precautions.     Vision                     Perception     Praxis      Pertinent Vitals/Pain Pain8/10 - premedicated. VSS     Hand Dominance Right   Extremity/Trunk Assessment Upper Extremity Assessment Upper Extremity Assessment: Overall WFL for tasks assessed   Lower Extremity Assessment Lower Extremity Assessment: Defer to PT evaluation   Cervical / Trunk Assessment Cervical / Trunk Assessment: Other exceptions (fusion)   Communication Communication Communication: No difficulties   Cognition Arousal/Alertness: Awake/alert Behavior During Therapy: WFL for tasks assessed/performed Overall Cognitive Status: Within Functional Limits for tasks assessed                     General Comments       Exercises       Shoulder Instructions      Home Living Family/patient expects to be discharged to:: Private residence Living Arrangements: Alone Available Help at Discharge: Family;Friend(s) Type of Home: House Home Access: Stairs to enter Entergy CorporationEntrance Stairs-Number of Steps: 4 Entrance Stairs-Rails: Can reach both Home Layout: One level     Bathroom Shower/Tub: Tub/shower unit KeySpanShower/tub  characteristics: Curtain Teacher, early years/pre: Yes How Accessible: Accessible via walker Home Equipment: None   Additional Comments: tub shower with curtain, standard toilets      Prior Functioning/Environment Level of Independence: Independent             OT Diagnosis: Generalized weakness;Acute pain   OT Problem List: Decreased strength;Decreased activity tolerance;Decreased knowledge of use of DME or AE;Decreased knowledge of  precautions;Pain   OT Treatment/Interventions: Self-care/ADL training;Therapeutic activities;Patient/family education;DME and/or AE instruction;Energy conservation    OT Goals(Current goals can be found in the care plan section) Acute Rehab OT Goals Patient Stated Goal: to go home OT Goal Formulation: With patient Time For Goal Achievement: 05/05/14 Potential to Achieve Goals: Good  OT Frequency: Min 2X/week   Barriers to D/C: Decreased caregiver support          Co-evaluation              End of Session Equipment Utilized During Treatment: Back brace Nurse Communication: Mobility status  Activity Tolerance: Patient tolerated treatment well Patient left: in chair;with call bell/phone within reach   Time: 1200-1225 OT Time Calculation (min): 25 min Charges:  OT General Charges $OT Visit: 1 Procedure OT Evaluation $Initial OT Evaluation Tier I: 1 Procedure OT Treatments $Self Care/Home Management : 23-37 mins G-Codes:    Jaydalyn Demattia,HILLARY 25-Apr-2014, 12:55 PM   [facility], OTR/L  [phone] 2014-04-25

## 2014-04-21 NOTE — Op Note (Addendum)
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 Postoperative diagnosis: Same Procedure: Laminectomy L2 L3-L4 and L5 with decompression of L2-L3 L4-L5 and S1 nerve roots more work than require for simple posterior lumbar interbody arthrodesis. Posterior lumbar interbody arthrodesis L2-3 L3-4 L4-5 and L5-S1. Segmental fixation L2-S1 with connection to previously placed hardware from T10-L2 STIR lateral arthrodesis with local autograft and allograft  Surgeon: Barnett Abu M.D. Assistant: Maeola Harman M.D. Anesthesia: Gen. endotracheal Indications: Patient is a 43 year old individual who's had a radius work-related injury with a burst fracture T12. He underwent decompression fusion from T10-L2. He had a second work-related injury where he sustained injury to the discs at L2-3 and L3-4. He is failed efforts at recovery with conservative efforts and is advised regarding the need for surgery to include not only decompression at L2-3 and L3-4 but also decompression at L5-S1 where he has spondylolisthesis. He is been admitted to the operating room for this procedure.  Procedure patient brought to the operating room supine on a stretcher. After the smooth induction of general endotracheal anesthesia, he was turned prone. The back was prepped with alcohol and DuraPrep and draped sterilely. An elliptical incision was made around the lower part of his previously placed incision. Then the dissection was carried down to expose the lamina arches and spinous processes of L2-L3 L4-L5 and the sacrum. The dissection was carried out laterally. After adequate exposure was obtained laminectomies were performed of L2 L3-L4 and L5. Individual nerve roots were then decompressed of significant stenosis these included bilateral L2  bilateral L3 bilateral L4 and bilateral L5 and both S1 nerve roots. When adequate decompression was achieved discs were inspected and there was noted to be a large herniation of the disc on the left side at L2-3 centrally and on left side at L3-4. L4-5 had a prominent bone spur associated causing sit significant canal stenosis. L5-S1 spondylolisthesis. The disc spaces were entered and complete discectomies were performed at each of these levels than at L2-L3 and a millimeter a degree peek spacer could be packed in placed into the interspace along with additional autograft and allograft that was created with osseous cell. L3-4 underwent placement with a 10 mm a degree peek spacer L4-5 underwent placement with a 10 mm 12 peek spacer at L5 S1-1 17 mm 12 peek spacer was used each of the spacers was 23 mm in length. The interspaces were packed with autograft and allograft individually with care being taken to protect the individual nerve roots. Once all the interbody grafting was performed pedicle entry sites were chosen at L3 L4-L5 and the sacrum 6.5 x 45 mm screws were placed in L3-L4 and S1 6.5 x 50 mm screws were placed in L5. There is a previous fusion from T12-L2 to that was uncovered on the inferior aspect. A portion of the rod above the L2 screw was then dissected free and a side connector could be placed on each of the rods laterally. A precontoured SideArm rod was then used to connect the pedicle screws from L3 to the sacrum to the side arm connector at L2. This was done bilaterally and required some manipulation of the screws to achieve appropriate alignment. The rods were contoured to fit and allow for maintenance of a good lumbar lordosis. Once the rods were fitted they were provisionally tightened radiographic exposure was obtained good lordosis was maintained  with good alignment in both the coronal and sagittal planes. The screw heads were then tightened and torqued to the final position. Graft was  packed into the lateral gutters which were decorticated from L3 down to the sacrum and the margin between L2 and L3 was packed with additional autograft and allograft. At the end of this the individual nerve roots were checked for good decompression. Hemostasis in the soft tissues was obtained carefully. The lumbar dorsal fascia was then closed with #1 Vicryl in interrupted fashion 2-0 Vicryl was used in the saphenous tissues and 3-0 Vicryl subcuticularly blood loss was estimated at approximately 1400 cc, 600 cc of Cell Saver blood was returned to the patient.

## 2014-04-21 NOTE — Plan of Care (Signed)
Problem: Consults Goal: Diagnosis - Spinal Surgery Outcome: Completed/Met Date Met:  04/21/14 Thoraco/Lumbar Spine Fusion.  Dr. Ellene Route, April 19, 2014.   Goal: Nutrition Consult-if indicated Outcome: Completed/Met Date Met:  04/21/14 Heart healthy, carb mod, vegetarian diet Goal: Diabetes Guidelines if Diabetic/Glucose > 140 If diabetic or lab glucose is > 140 mg/dl - Initiate Diabetes/Hyperglycemia Guidelines & Document Interventions  Outcome: Completed/Met Date Met:  04/21/14 [facility]

## 2014-04-22 LAB — GLUCOSE, CAPILLARY
GLUCOSE-CAPILLARY: 89 mg/dL (ref 70–99)
GLUCOSE-CAPILLARY: 101 mg/dL — AB (ref 70–99)
Glucose-Capillary: 88 mg/dL (ref 70–99)
Glucose-Capillary: 102 mg/dL — ABNORMAL HIGH (ref 70–99)
Glucose-Capillary: 96 mg/dL (ref 70–99)

## 2014-04-22 LAB — POCT I-STAT 4, (NA,K, GLUC, HGB,HCT)
Glucose, Bld: 156 mg/dL — ABNORMAL HIGH (ref 70–99)
HCT: 32 % — ABNORMAL LOW (ref 39.0–52.0)
HEMOGLOBIN: 10.9 g/dL — AB (ref 13.0–17.0)
Potassium: 5 mEq/L (ref 3.7–5.3)
Sodium: 141 mEq/L (ref 137–147)

## 2014-04-22 NOTE — Progress Notes (Signed)
OT Cancellation Note  Patient Details Name: Devin [name] MRN: [id number] DOB: 06/07/1971   Cancelled Treatment:    Reason Eval/Treat Not Completed: Other (comment) Pt working with PT. Will return this pm or see in am. Progressing well. [facility] Healthcare[facility],[name], OTR/L  [phone] 04/22/2014 04/22/2014, 3:57 PM

## 2014-04-22 NOTE — Progress Notes (Addendum)
Physical Therapy Treatment and Discharge Patient Details Name: Sebastian Lurz MRN: [id number] DOB: [birth year] Today's Date: 04/22/2014    History of Present Illness Patient is a 43 year old individual is had a previous work-related injury having sustained a thoracic lumbar burst fracture. He underwent surgical decompression and fusion from L2 to T. 10.    PT Comments    Pt very familiar with back precautions and techniques from previous surgery. All education complete and pt is at modified independent level with RW (which he now states he has at home). D/C from PT.   Follow Up Recommendations  No PT follow up     Equipment Recommendations  None recommended by PT    Recommendations for Other Services       Precautions / Restrictions Precautions Precautions: Back Precaution Booklet Issued: No Precaution Comments: pt able to state and adhere to precautions Required Braces or Orthoses: Spinal Brace Spinal Brace: Lumbar corset;Applied in sitting position Restrictions Weight Bearing Restrictions: No    Mobility  Bed Mobility Overal bed mobility: Needs Assistance Bed Mobility: Rolling;Sit to Sidelying Rolling: Supervision       Sit to sidelying: Supervision General bed mobility comments: vc to bend knees when rolling to incr ease/decr strain on back  Transfers Overall transfer level: Modified independent Equipment used: Rolling walker (2 wheeled) Transfers: Sit to/from Stand Sit to Stand: Modified independent (Device/Increase time)            Ambulation/Gait Ambulation/Gait assistance: Modified independent (Device/Increase time) Ambulation Distance (Feet): 200 Feet Assistive device: Rolling walker (2 wheeled) Gait Pattern/deviations: Step-through pattern;Decreased stride length Gait velocity: decreased       Stairs Stairs: Yes Stairs assistance: Supervision Stair Management: One rail Right Number of Stairs: 6 General stair comments: vc for  sequencing  Wheelchair Mobility    Modified Rankin (Stroke Patients Only)       Balance                                    Cognition Arousal/Alertness: Awake/alert Behavior During Therapy: WFL for tasks assessed/performed Overall Cognitive Status: Within Functional Limits for tasks assessed                      Exercises      General Comments General comments (skin integrity, edema, etc.): Pt reported he was pretty uncomfortable after PT session 7/23. Discussed how long pt sat up and to limit sitting to one hour at a time. Pt also reports MD has changed his pain medicine.      Pertinent Vitals/Pain Back pain 8/10 at end of session; assisted back to bed and patient repositioned for comfort     Home Living               Home Equipment: Walker - 2 wheels      Prior Function            PT Goals (current goals can now be found in the care plan section) Acute Rehab PT Goals Patient Stated Goal: to go home Progress towards PT goals: Goals met/education completed, patient discharged from PT    Frequency       PT Plan Current plan remains appropriate    Co-evaluation             End of Session Equipment Utilized During Treatment: Back brace Activity Tolerance: Patient tolerated treatment well Patient left: with call bell/phone within reach;in bed  Time: 7981-0254 PT Time Calculation (min): 21 min  Charges:  $Gait Training: 8-22 mins                    G Codes:      Laurence Folz 05/18/14, 12:12 PM Pager [phone]

## 2014-04-23 LAB — GLUCOSE, CAPILLARY: Glucose-Capillary: 98 mg/dL (ref 70–99)

## 2014-04-23 MED ORDER — OXYCODONE HCL 5 MG PO TABS: 5.0000 mg | ORAL_TABLET | ORAL | Status: DC | PRN

## 2014-04-23 MED ORDER — DOXYCYCLINE HYCLATE 100 MG PO TABS
100.0000 mg | ORAL_TABLET | Freq: Two times a day (BID) | ORAL | Status: DC
  Administered 2014-04-23: 100 mg via ORAL
  Filled 2014-04-23 (×2): qty 1

## 2014-04-23 MED ORDER — OXYCODONE-ACETAMINOPHEN 5-325 MG PO TABS: 1.0000 | ORAL_TABLET | ORAL | Status: DC | PRN

## 2014-04-23 MED ORDER — LISINOPRIL 20 MG PO TABS: 20.0000 mg | ORAL_TABLET | Freq: Every day | ORAL | Status: DC

## 2014-04-23 MED ORDER — KETOROLAC TROMETHAMINE 15 MG/ML IJ SOLN
15.0000 mg | Freq: Four times a day (QID) | INTRAMUSCULAR | Status: DC
  Filled 2014-04-23 (×4): qty 1

## 2014-04-23 MED ORDER — PAROXETINE HCL 40 MG PO TABS: 40.0000 mg | ORAL_TABLET | ORAL | Status: DC

## 2014-04-23 NOTE — Plan of Care (Signed)
Problem: Phase I Progression Outcomes Goal: Pain controlled with appropriate interventions Outcome: Completed/Met Date Met:  04/23/14 Pain controlled with oral analgesics

## 2014-04-23 NOTE — Discharge Summary (Signed)
  Physician Discharge Summary  Patient ID: Devin [name] MRN: [id number] DOB/AGE: 45/04/1971 45 y.o.  Admit date: 04/19/2014 Discharge date: 04/23/2014  Admission Diagnoses: Lumbar spondylosis  Discharge Diagnoses: Same Active Problems:   Spondylosis of lumbosacral region   Spondylosis of lumbar joint   Discharged Condition: Stable  Hospital Course:  Mrs. Devin DomeJerry Messerschmidt is a 43 y.o. male who underwent uncomplicated 4 level PLIF. Due to length/extent of surgery he was taken to the neuro ICU postop where he was at his neurologic baseline with appropriate back pain. His postoperative course was largely uncomplicated with progressive mobilization and pain control. He was evaluated by PT/OT and continued to make improvements in his mobility. He was stable for d/c home on POD# 5.  Treatments: Surgery - L2-S1 PLIF  Discharge Exam: Blood pressure 131/69, pulse 86, temperature 98.6 F (37 C), temperature source Oral, resp. rate 8, height 5\' 9"  (1.753 m), weight 113 kg (249 lb 1.9 oz), SpO2 100.00%. Awake, alert, oriented Speech fluent, appropriate CN grossly intact 5/5 BUE/BLE Wound c/d/i  Follow-up: Follow-up in Dr. Verlee RossettiElsner's office [facility]( Neurosurgery and Spine [phone]) in 2-3 weeks  Disposition: Home     Medication List         acetaminophen 500 MG tablet  Commonly known as:  TYLENOL  Take 1,500 mg by mouth every 6 (six) hours as needed for moderate pain.     lisinopril 20 MG tablet  Commonly known as:  PRINIVIL,ZESTRIL  Take 1 tablet (20 mg total) by mouth daily.     oxyCODONE 5 MG immediate release tablet  Commonly known as:  Oxy IR/ROXICODONE  Take 1-2 tablets (5-10 mg total) by mouth every 4 (four) hours as needed for severe pain.     oxyCODONE-acetaminophen 5-325 MG per tablet  Commonly known as:  PERCOCET/ROXICET  Take 1 tablet by mouth every 4 (four) hours as needed for moderate pain.     PARoxetine 40 MG tablet  Commonly known as:  PAXIL  Take 1 tablet  (40 mg total) by mouth every morning.         SignedLisbeth Renshaw: Quiana Cobaugh, C 04/23/2014, 10:11 AM

## 2014-05-11 ENCOUNTER — Inpatient Hospital Stay (HOSPITAL_COMMUNITY)
Admission: EM | Admit: 2014-05-11 | Discharge: 2014-05-19 | DRG: 907 | Disposition: A | Discharge status: Home / Self Care | Payer: BC Managed Care – PPO | Attending: Neurological Surgery | Admitting: Neurological Surgery

## 2014-05-11 ENCOUNTER — Encounter (HOSPITAL_COMMUNITY): Payer: Self-pay | Admitting: Emergency Medicine

## 2014-05-11 ENCOUNTER — Encounter (HOSPITAL_COMMUNITY): Payer: BC Managed Care – PPO | Admitting: Anesthesiology

## 2014-05-11 ENCOUNTER — Encounter (HOSPITAL_COMMUNITY)
Admission: EM | Disposition: A | Discharge status: Home / Self Care | Payer: Self-pay | Source: Home / Self Care | Attending: Neurological Surgery

## 2014-05-11 ENCOUNTER — Emergency Department (HOSPITAL_COMMUNITY): Payer: BC Managed Care – PPO

## 2014-05-11 ENCOUNTER — Emergency Department (HOSPITAL_COMMUNITY): Payer: BC Managed Care – PPO | Admitting: Anesthesiology

## 2014-05-11 DIAGNOSIS — M549 Dorsalgia, unspecified: Secondary | ICD-10-CM | POA: Diagnosis not present

## 2014-05-11 DIAGNOSIS — Z791 Long term (current) use of non-steroidal anti-inflammatories (NSAID): Secondary | ICD-10-CM

## 2014-05-11 DIAGNOSIS — E876 Hypokalemia: Secondary | ICD-10-CM | POA: Diagnosis present

## 2014-05-11 DIAGNOSIS — Z885 Allergy status to narcotic agent status: Secondary | ICD-10-CM

## 2014-05-11 DIAGNOSIS — Y838 Other surgical procedures as the cause of abnormal reaction of the patient, or of later complication, without mention of misadventure at the time of the procedure: Secondary | ICD-10-CM | POA: Diagnosis present

## 2014-05-11 DIAGNOSIS — I621 Nontraumatic extradural hemorrhage: Secondary | ICD-10-CM | POA: Diagnosis present

## 2014-05-11 DIAGNOSIS — E119 Type 2 diabetes mellitus without complications: Secondary | ICD-10-CM | POA: Diagnosis present

## 2014-05-11 DIAGNOSIS — F17200 Nicotine dependence, unspecified, uncomplicated: Secondary | ICD-10-CM | POA: Diagnosis present

## 2014-05-11 DIAGNOSIS — I10 Essential (primary) hypertension: Secondary | ICD-10-CM | POA: Diagnosis present

## 2014-05-11 DIAGNOSIS — M545 Low back pain: Secondary | ICD-10-CM

## 2014-05-11 DIAGNOSIS — W19XXXA Unspecified fall, initial encounter: Secondary | ICD-10-CM

## 2014-05-11 DIAGNOSIS — IMO0002 Reserved for concepts with insufficient information to code with codable children: Principal | ICD-10-CM | POA: Diagnosis present

## 2014-05-11 HISTORY — PX: WOUND EXPLORATION: SHX6188

## 2014-05-11 LAB — CBC WITH DIFFERENTIAL/PLATELET
Basophils Absolute: 0.1 10*3/uL (ref 0.0–0.1)
Basophils Relative: 0 % (ref 0–1)
EOS ABS: 0.4 10*3/uL (ref 0.0–0.7)
Eosinophils Relative: 3 % (ref 0–5)
HCT: 36.3 % — ABNORMAL LOW (ref 39.0–52.0)
HEMOGLOBIN: 11.6 g/dL — AB (ref 13.0–17.0)
Lymphocytes Relative: 25 % (ref 12–46)
Lymphs Abs: 2.9 10*3/uL (ref 0.7–4.0)
MCH: 32.8 pg (ref 26.0–34.0)
MCHC: 32 g/dL (ref 30.0–36.0)
MCV: 102.5 fL — ABNORMAL HIGH (ref 78.0–100.0)
MONOS PCT: 5 % (ref 3–12)
Monocytes Absolute: 0.6 10*3/uL (ref 0.1–1.0)
NEUTROS ABS: 7.8 10*3/uL — AB (ref 1.7–7.7)
NEUTROS PCT: 67 % (ref 43–77)
PLATELETS: 489 10*3/uL — AB (ref 150–400)
RBC: 3.54 MIL/uL — ABNORMAL LOW (ref 4.22–5.81)
RDW: 15.9 % — ABNORMAL HIGH (ref 11.5–15.5)
WBC: 11.7 10*3/uL — ABNORMAL HIGH (ref 4.0–10.5)

## 2014-05-11 LAB — I-STAT CHEM 8, ED
BUN: 12 mg/dL (ref 6–23)
Calcium, Ion: 1.09 mmol/L — ABNORMAL LOW (ref 1.12–1.23)
Chloride: 108 mEq/L (ref 96–112)
Creatinine, Ser: 0.7 mg/dL (ref 0.50–1.35)
Glucose, Bld: 79 mg/dL (ref 70–99)
HEMATOCRIT: 38 % — AB (ref 39.0–52.0)
HEMOGLOBIN: 12.9 g/dL — AB (ref 13.0–17.0)
POTASSIUM: 3.6 meq/L — AB (ref 3.7–5.3)
SODIUM: 143 meq/L (ref 137–147)
TCO2: 20 mmol/L (ref 0–100)

## 2014-05-11 LAB — TYPE AND SCREEN
ABO/RH(D): O POS
Antibody Screen: NEGATIVE

## 2014-05-11 LAB — BASIC METABOLIC PANEL
Anion gap: 14 (ref 5–15)
BUN: 13 mg/dL (ref 6–23)
CHLORIDE: 103 meq/L (ref 96–112)
CO2: 23 mEq/L (ref 19–32)
Calcium: 8.6 mg/dL (ref 8.4–10.5)
Creatinine, Ser: 0.74 mg/dL (ref 0.50–1.35)
Glucose, Bld: 72 mg/dL (ref 70–99)
POTASSIUM: 3.7 meq/L (ref 3.7–5.3)
SODIUM: 140 meq/L (ref 137–147)

## 2014-05-11 LAB — CK: CK TOTAL: 39 U/L (ref 7–232)

## 2014-05-11 SURGERY — WOUND EXPLORATION
Anesthesia: General | Site: Back

## 2014-05-11 MED ORDER — 0.9 % SODIUM CHLORIDE (POUR BTL) OPTIME
TOPICAL | Status: DC | PRN
  Administered 2014-05-11: 1000 mL

## 2014-05-11 MED ORDER — SUFENTANIL CITRATE 50 MCG/ML IV SOLN
INTRAVENOUS | Status: AC
  Filled 2014-05-11: qty 1

## 2014-05-11 MED ORDER — CEFAZOLIN SODIUM-DEXTROSE 2-3 GM-% IV SOLR
INTRAVENOUS | Status: AC
  Filled 2014-05-11: qty 50

## 2014-05-11 MED ORDER — SUCCINYLCHOLINE CHLORIDE 20 MG/ML IJ SOLN
INTRAMUSCULAR | Status: AC
  Filled 2014-05-11: qty 1

## 2014-05-11 MED ORDER — OXYCODONE HCL 5 MG/5ML PO SOLN: 5.0000 mg | Freq: Once | ORAL | Status: DC | PRN

## 2014-05-11 MED ORDER — FENTANYL CITRATE 0.05 MG/ML IJ SOLN
50.0000 ug | INTRAMUSCULAR | Status: DC | PRN
  Administered 2014-05-11: 50 ug via INTRAVENOUS
  Filled 2014-05-11: qty 2

## 2014-05-11 MED ORDER — PROPOFOL 10 MG/ML IV BOLUS
INTRAVENOUS | Status: DC | PRN
  Administered 2014-05-11: 200 mg via INTRAVENOUS

## 2014-05-11 MED ORDER — ROCURONIUM BROMIDE 50 MG/5ML IV SOLN
INTRAVENOUS | Status: AC
  Filled 2014-05-11: qty 1

## 2014-05-11 MED ORDER — OXYCODONE-ACETAMINOPHEN 5-325 MG PO TABS: 2.0000 | ORAL_TABLET | ORAL | Status: DC | PRN

## 2014-05-11 MED ORDER — HYDROCODONE-ACETAMINOPHEN 5-325 MG PO TABS
2.0000 | ORAL_TABLET | Freq: Once | ORAL | Status: AC
  Administered 2014-05-11: 2 via ORAL
  Filled 2014-05-11: qty 2

## 2014-05-11 MED ORDER — SODIUM CHLORIDE 0.9 % IV SOLN
INTRAVENOUS | Status: DC | PRN
  Administered 2014-05-11 (×2): via INTRAVENOUS

## 2014-05-11 MED ORDER — ONDANSETRON HCL 4 MG/2ML IJ SOLN
INTRAMUSCULAR | Status: DC | PRN
  Administered 2014-05-11: 4 mg via INTRAVENOUS

## 2014-05-11 MED ORDER — LIDOCAINE HCL (CARDIAC) 20 MG/ML IV SOLN
INTRAVENOUS | Status: AC
  Filled 2014-05-11: qty 5

## 2014-05-11 MED ORDER — NEOSTIGMINE METHYLSULFATE 10 MG/10ML IV SOLN
INTRAVENOUS | Status: AC
  Filled 2014-05-11: qty 1

## 2014-05-11 MED ORDER — LIDOCAINE HCL (CARDIAC) 20 MG/ML IV SOLN
INTRAVENOUS | Status: DC | PRN
  Administered 2014-05-11: 100 mg via INTRAVENOUS

## 2014-05-11 MED ORDER — MIDAZOLAM HCL 2 MG/2ML IJ SOLN
INTRAMUSCULAR | Status: AC
  Filled 2014-05-11: qty 2

## 2014-05-11 MED ORDER — SUFENTANIL CITRATE 50 MCG/ML IV SOLN
INTRAVENOUS | Status: DC | PRN
  Administered 2014-05-11: 30 ug via INTRAVENOUS
  Administered 2014-05-11: 20 ug via INTRAVENOUS

## 2014-05-11 MED ORDER — OXYCODONE HCL 5 MG PO TABS: 5.0000 mg | ORAL_TABLET | Freq: Once | ORAL | Status: DC | PRN

## 2014-05-11 MED ORDER — SUCCINYLCHOLINE CHLORIDE 20 MG/ML IJ SOLN
INTRAMUSCULAR | Status: DC | PRN
  Administered 2014-05-11: 120 mg via INTRAVENOUS

## 2014-05-11 MED ORDER — THROMBIN 20000 UNITS EX SOLR
CUTANEOUS | Status: DC | PRN
  Administered 2014-05-11: 23:00:00 via TOPICAL

## 2014-05-11 MED ORDER — GLYCOPYRROLATE 0.2 MG/ML IJ SOLN
INTRAMUSCULAR | Status: AC
  Filled 2014-05-11: qty 2

## 2014-05-11 MED ORDER — DEXAMETHASONE SODIUM PHOSPHATE 4 MG/ML IJ SOLN
INTRAMUSCULAR | Status: DC | PRN
Start: 2014-05-11 — End: 2014-05-12
  Administered 2014-05-11: 4 mg via INTRAVENOUS

## 2014-05-11 MED ORDER — MIDAZOLAM HCL 5 MG/5ML IJ SOLN
INTRAMUSCULAR | Status: DC | PRN
  Administered 2014-05-11: 2 mg via INTRAVENOUS

## 2014-05-11 MED ORDER — ONDANSETRON HCL 4 MG/2ML IJ SOLN
4.0000 mg | Freq: Four times a day (QID) | INTRAMUSCULAR | Status: DC | PRN
  Administered 2014-05-12: 4 mg via INTRAVENOUS

## 2014-05-11 MED ORDER — HYDROMORPHONE HCL PF 1 MG/ML IJ SOLN
INTRAMUSCULAR | Status: AC
  Filled 2014-05-11: qty 1

## 2014-05-11 MED ORDER — SODIUM CHLORIDE 0.9 % IR SOLN
Status: DC | PRN
  Administered 2014-05-11 (×2)

## 2014-05-11 MED ORDER — CEFAZOLIN SODIUM-DEXTROSE 2-3 GM-% IV SOLR
INTRAVENOUS | Status: DC | PRN
  Administered 2014-05-11: 2 g via INTRAVENOUS

## 2014-05-11 MED ORDER — ONDANSETRON HCL 4 MG/2ML IJ SOLN
INTRAMUSCULAR | Status: AC
  Filled 2014-05-11: qty 2

## 2014-05-11 MED ORDER — PROPOFOL 10 MG/ML IV BOLUS
INTRAVENOUS | Status: AC
  Filled 2014-05-11: qty 20

## 2014-05-11 MED ORDER — POTASSIUM CHLORIDE CRYS ER 20 MEQ PO TBCR
40.0000 meq | EXTENDED_RELEASE_TABLET | Freq: Once | ORAL | Status: AC
  Administered 2014-05-11: 40 meq via ORAL
  Filled 2014-05-11: qty 2

## 2014-05-11 MED ORDER — SODIUM CHLORIDE 0.9 % IV SOLN
Freq: Once | INTRAVENOUS | Status: AC
  Administered 2014-05-11: 1000 mL via INTRAVENOUS

## 2014-05-11 MED ORDER — GLYCOPYRROLATE 0.2 MG/ML IJ SOLN
INTRAMUSCULAR | Status: DC | PRN
  Administered 2014-05-11: 0.4 mg via INTRAVENOUS

## 2014-05-11 MED ORDER — NEOSTIGMINE METHYLSULFATE 10 MG/10ML IV SOLN
INTRAVENOUS | Status: DC | PRN
  Administered 2014-05-11: 3 mg via INTRAVENOUS

## 2014-05-11 MED ORDER — ROCURONIUM BROMIDE 100 MG/10ML IV SOLN
INTRAVENOUS | Status: DC | PRN
  Administered 2014-05-11: 25 mg via INTRAVENOUS

## 2014-05-11 MED ORDER — SODIUM CHLORIDE 0.9 % IJ SOLN
INTRAMUSCULAR | Status: AC
  Filled 2014-05-11: qty 10

## 2014-05-11 MED ORDER — HYDROMORPHONE HCL PF 1 MG/ML IJ SOLN
0.2500 mg | INTRAMUSCULAR | Status: DC | PRN
  Administered 2014-05-12 (×4): 0.5 mg via INTRAVENOUS

## 2014-05-11 MED ORDER — SODIUM CHLORIDE 0.9 % IV BOLUS (SEPSIS)
1000.0000 mL | Freq: Once | INTRAVENOUS | Status: AC
  Administered 2014-05-11: 1000 mL via INTRAVENOUS

## 2014-05-11 SURGICAL SUPPLY — 48 items
BAG DECANTER FOR FLEXI CONT (MISCELLANEOUS) ×3 IMPLANT
BENZOIN TINCTURE PRP APPL 2/3 (GAUZE/BANDAGES/DRESSINGS) ×3 IMPLANT
BLADE SURG ROTATE 9660 (MISCELLANEOUS) IMPLANT
BRUSH SCRUB EZ PLAIN DRY (MISCELLANEOUS) ×3 IMPLANT
CANISTER SUCT 3000ML (MISCELLANEOUS) ×3 IMPLANT
CLOSURE WOUND 1/2 X4 (GAUZE/BANDAGES/DRESSINGS) ×1
CONT SPEC 4OZ CLIKSEAL STRL BL (MISCELLANEOUS) ×3 IMPLANT
DERMABOND ADVANCED (GAUZE/BANDAGES/DRESSINGS) ×2
DERMABOND ADVANCED .7 DNX12 (GAUZE/BANDAGES/DRESSINGS) ×1 IMPLANT
DRAPE LAPAROTOMY 100X72X124 (DRAPES) ×3 IMPLANT
DRAPE POUCH INSTRU U-SHP 10X18 (DRAPES) ×3 IMPLANT
DRAPE SURG 17X23 STRL (DRAPES) IMPLANT
DRSG OPSITE POSTOP 4X6 (GAUZE/BANDAGES/DRESSINGS) ×3 IMPLANT
ELECT REM PT RETURN 9FT ADLT (ELECTROSURGICAL) ×3
ELECTRODE REM PT RTRN 9FT ADLT (ELECTROSURGICAL) ×1 IMPLANT
EVACUATOR 1/8 PVC DRAIN (DRAIN) ×3 IMPLANT
GAUZE SPONGE 4X4 12PLY STRL (GAUZE/BANDAGES/DRESSINGS) ×3 IMPLANT
GAUZE SPONGE 4X4 16PLY XRAY LF (GAUZE/BANDAGES/DRESSINGS) IMPLANT
GLOVE BIO SURGEON STRL SZ 6.5 (GLOVE) ×2 IMPLANT
GLOVE BIO SURGEON STRL SZ7 (GLOVE) ×3 IMPLANT
GLOVE BIO SURGEONS STRL SZ 6.5 (GLOVE) ×1
GLOVE ECLIPSE 9.0 STRL (GLOVE) ×3 IMPLANT
GLOVE EXAM NITRILE LRG STRL (GLOVE) IMPLANT
GLOVE EXAM NITRILE MD LF STRL (GLOVE) IMPLANT
GLOVE EXAM NITRILE XL STR (GLOVE) IMPLANT
GLOVE EXAM NITRILE XS STR PU (GLOVE) IMPLANT
GOWN STRL REUS W/ TWL LRG LVL3 (GOWN DISPOSABLE) ×1 IMPLANT
GOWN STRL REUS W/ TWL XL LVL3 (GOWN DISPOSABLE) ×1 IMPLANT
GOWN STRL REUS W/TWL 2XL LVL3 (GOWN DISPOSABLE) IMPLANT
GOWN STRL REUS W/TWL LRG LVL3 (GOWN DISPOSABLE) ×2
GOWN STRL REUS W/TWL XL LVL3 (GOWN DISPOSABLE) ×2
KIT BASIN OR (CUSTOM PROCEDURE TRAY) ×3 IMPLANT
KIT ROOM TURNOVER OR (KITS) ×3 IMPLANT
NEEDLE HYPO 25X1 1.5 SAFETY (NEEDLE) IMPLANT
NS IRRIG 1000ML POUR BTL (IV SOLUTION) ×3 IMPLANT
PACK LAMINECTOMY NEURO (CUSTOM PROCEDURE TRAY) ×3 IMPLANT
SPONGE SURGIFOAM ABS GEL SZ50 (HEMOSTASIS) ×3 IMPLANT
STRIP CLOSURE SKIN 1/2X4 (GAUZE/BANDAGES/DRESSINGS) ×2 IMPLANT
SUT VIC AB 0 CT1 18XCR BRD8 (SUTURE) ×1 IMPLANT
SUT VIC AB 0 CT1 8-18 (SUTURE) ×2
SUT VIC AB 2-0 CT1 18 (SUTURE) ×3 IMPLANT
SWAB CULTURE LIQ STUART DBL (MISCELLANEOUS) ×3 IMPLANT
SYR 20ML ECCENTRIC (SYRINGE) ×3 IMPLANT
TAPE STRIPS DRAPE STRL (GAUZE/BANDAGES/DRESSINGS) ×3 IMPLANT
TOWEL OR 17X24 6PK STRL BLUE (TOWEL DISPOSABLE) ×3 IMPLANT
TOWEL OR 17X26 10 PK STRL BLUE (TOWEL DISPOSABLE) ×3 IMPLANT
TUBE ANAEROBIC SPECIMEN COL (MISCELLANEOUS) ×3 IMPLANT
WATER STERILE IRR 1000ML POUR (IV SOLUTION) ×3 IMPLANT

## 2014-05-11 NOTE — Anesthesia Preprocedure Evaluation (Signed)
Anesthesia Evaluation  Patient identified by MRN, date of birth, ID band Patient awake    Reviewed: Allergy & Precautions, H&P , NPO status , Patient's Chart, lab work & pertinent test results  Airway Mallampati: II  Neck ROM: full    Dental   Pulmonary Current Smoker,          Cardiovascular hypertension,     Neuro/Psych    GI/Hepatic   Endo/Other  diabetes, Type 2obese  Renal/GU      Musculoskeletal   Abdominal   Peds  Hematology   Anesthesia Other Findings   Reproductive/Obstetrics                           Anesthesia Physical Anesthesia Plan  ASA: II  Anesthesia Plan: General   Post-op Pain Management:    Induction: Intravenous  Airway Management Planned: Oral ETT  Additional Equipment:   Intra-op Plan:   Post-operative Plan: Extubation in OR  Informed Consent: I have reviewed the patients History and Physical, chart, labs and discussed the procedure including the risks, benefits and alternatives for the proposed anesthesia with the patient or authorized representative who has indicated his/her understanding and acceptance.     Plan Discussed with: CRNA, Anesthesiologist and Surgeon  Anesthesia Plan Comments:         Anesthesia Quick Evaluation

## 2014-05-11 NOTE — ED Notes (Signed)
Pt's home meds, cell phone, cell phone charger, shirt, and pants in personal belonging bag and sent with pt to Neuro OR.

## 2014-05-11 NOTE — ED Notes (Signed)
Pt able to flex and extend legs, lift his legs against pressure, and press his feet against pressure. Pt states it is painful

## 2014-05-11 NOTE — ED Notes (Addendum)
[facility] Per EMS pt fell Saturday, 05/07/14 and has been unable to stand since. Per EMS pt had a fusion done on 04/19/14 on L2-L3 and L5-S1. EMS vitals: 160/106 92 HR, 98% RA. Pt states he fell Saturday on his left hip and has been unable to stand since, pt states he slipped on tile floor at home. States pain back pain radiates to his hip and down his legs. Pt has severe swelling to lower back pain, warm to touch. Pt states he was able to ambulate without assistance prior to fall. nad noted.

## 2014-05-11 NOTE — Brief Op Note (Signed)
05/11/2014  11:42 PM  PATIENT:  Devin [name]  43 y.o. male  PRE-OPERATIVE DIAGNOSIS:  Lumbar hematoma  POST-OPERATIVE DIAGNOSIS:  Lumbar hematoma  PROCEDURE:  Procedure(s): Lumbar WOUND EXPLORATION, evcuation of hematoma (N/A)  SURGEON:  Surgeon(s) and Role:    * Temple PaciniHenry A Kollyns Mickelson, MD - Primary  PHYSICIAN ASSISTANT:   ASSISTANTS:    ANESTHESIA:   general  EBL:  Total I/O In: 1900 [P.O.:60; I.V.:1840] Out: 300 [Urine:300]  BLOOD ADMINISTERED:none  DRAINS: (med) Hemovact drain(s) in the Sub q with  Suction Open   LOCAL MEDICATIONS USED:  MARCAINE     SPECIMEN:  No Specimen  DISPOSITION OF SPECIMEN:  N/A  COUNTS:  YES  TOURNIQUET:  * No tourniquets in log *  DICTATION: .Dragon Dictation  PLAN OF CARE: Admit for overnight observation  PATIENT DISPOSITION:  PACU - hemodynamically stable.   Delay start of Pharmacological VTE agent (>24hrs) due to surgical blood loss or risk of bleeding: yes

## 2014-05-11 NOTE — ED Provider Notes (Signed)
CSN: [id number]     Arrival date & time 05/11/14  1359 History   First MD Initiated Contact with Patient 05/11/14 1504     Chief Complaint  Patient presents with  . Back Pain  . Fall     (Consider location/radiation/quality/duration/timing/severity/associated sxs/prior Treatment) HPI Comments: 43 year old male with spondylosis of lumbar spine, and surgery July 21 by [name] presents with right lower back pain worsening since fall on Saturday when he slipped and than on the right side. Patient has had persistent pain since worse with movement. Patient denies any weakness or persistent numbness, no change in bladder function, no fevers or chills. Patient's had mild swelling over the area since surgery however no discharge, wound healing well and patient has followup appointment moral with surgeon. Patient is told to come in for eval and pain control until appointment, he is out of his narcotics at this time. Patient denies head injury or other injuries.   Patient is a 43 y.o. male presenting with back pain and fall. The history is provided by the patient.  Back Pain Associated symptoms: no abdominal pain, no chest pain, no dysuria, no fever, no headaches and no weakness   Fall Pertinent negatives include no chest pain, no abdominal pain, no headaches and no shortness of breath.    Past Medical History  Diagnosis Date  . Diabetes mellitus without complication     diet contolled  only  . Hypertension    Past Surgical History  Procedure Laterality Date  . Back surgery      x2 surgeries  . Nasal dilation    . Rotator cuff repair    . Posterior lumbar fusion 4 level N/A 04/19/2014    Procedure: LUMBAR TWO-THREE,LUMBAR THREE-FOUR,LUMBAR FOUR-FIVE,LUMBAR FIVE-SACRAL-ONE POSTERIOR LUMBAR INTERBODY FUSION/ADD ON TO THORACIC TEN-LUMBAT TWO FUSION;  Surgeon: Barnett Abu, MD;  Location: [facility];  Service: Neurosurgery;  Laterality: N/A;   No family history on file. History  Substance  Use Topics  . Smoking status: Current Every Day Smoker -- 1.00 packs/day for 20 years  . Smokeless tobacco: Not on file  . Alcohol Use: No     Comment: occ    Review of Systems  Constitutional: Negative for fever and chills.  Eyes: Negative for visual disturbance.  Respiratory: Negative for shortness of breath.   Cardiovascular: Negative for chest pain.  Gastrointestinal: Negative for vomiting and abdominal pain.  Genitourinary: Negative for dysuria, flank pain and difficulty urinating.  Musculoskeletal: Positive for back pain. Negative for neck pain and neck stiffness.  Skin: Negative for rash.  Neurological: Negative for weakness, light-headedness and headaches.      Allergies  Codeine; Hydrocodone; and Other  Home Medications   Prior to Admission medications   Medication Sig Start Date End Date Taking? Authorizing Provider  acetaminophen (TYLENOL) 500 MG tablet Take 2,000 mg by mouth every 6 (six) hours as needed for moderate pain or headache.    Yes Historical Provider, MD  lisinopril (PRINIVIL,ZESTRIL) 20 MG tablet Take 1 tablet (20 mg total) by mouth daily. 04/23/14  Yes Lisbeth Renshaw, MD  naproxen (NAPROSYN) 500 MG tablet Take 500 mg by mouth 2 (two) times daily with a meal.   Yes Historical Provider, MD  oxyCODONE (OXY IR/ROXICODONE) 5 MG immediate release tablet Take 1-2 tablets (5-10 mg total) by mouth every 4 (four) hours as needed for severe pain. 04/23/14  Yes Lisbeth Renshaw, MD  oxyCODONE-acetaminophen (PERCOCET/ROXICET) 5-325 MG per tablet Take 1 tablet by mouth every 4 (four)  hours as needed for moderate pain. 04/23/14  Yes Lisbeth Renshaw, MD  PARoxetine (PAXIL) 40 MG tablet Take 1 tablet (40 mg total) by mouth every morning. 04/23/14  Yes Lisbeth Renshaw, MD  Potassium 99 MG TABS Take 99 mg by mouth 2 (two) times daily.   Yes Historical Provider, MD   BP 153/97  Pulse 96  Temp(Src) 98.8 F (37.1 C) (Oral)  Resp 18  Ht 5\' 9"  (1.753 m)  Wt 240 lb  (108.863 kg)  BMI 35.43 kg/m2  SpO2 99% Physical Exam  Nursing note and vitals reviewed. Constitutional: He is oriented to person, place, and time. He appears well-developed and well-nourished.  HENT:  Head: Normocephalic and atraumatic.  Eyes: Conjunctivae are normal. Right eye exhibits no discharge. Left eye exhibits no discharge.  Neck: Normal range of motion. Neck supple. No tracheal deviation present.  Cardiovascular: Normal rate and regular rhythm.   Pulmonary/Chest: Effort normal and breath sounds normal.  Abdominal: Soft. He exhibits no distension. There is no tenderness. There is no guarding.  Musculoskeletal: He exhibits edema and tenderness.  Patient has mild tenderness paraspinal right lumbar and mild midline, surgical incision no erythema, no induration, no discharge, healing well, moderate swelling left paraspinal and mild tenderness to palpation.  Neurological: He is alert and oriented to person, place, and time. GCS eye subscore is 4. GCS verbal subscore is 5. GCS motor subscore is 6.  Reflex Scores:      Patellar reflexes are 1+ on the right side and 1+ on the left side.      Achilles reflexes are 1+ on the right side and 1+ on the left side. Patient has 5+ strength with flexion extension lower extremity bilateral at the hips, knees and great toes. Sensation to palpation intact in major nerves in lower extremities. Patient denies loss of sensation perianally or with wiping.  Skin: Skin is warm. No rash noted.  Psychiatric: He has a normal mood and affect.    ED Course  Procedures (including critical care time) Labs Review Labs Reviewed  CBC WITH DIFFERENTIAL - Abnormal; Notable for the following:    WBC 11.7 (*)    RBC 3.54 (*)    Hemoglobin 11.6 (*)    HCT 36.3 (*)    MCV 102.5 (*)    RDW 15.9 (*)    Platelets 489 (*)    Neutro Abs 7.8 (*)    All other components within normal limits  I-STAT CHEM 8, ED - Abnormal; Notable for the following:    Potassium 3.6  (*)    Calcium, Ion 1.09 (*)    Hemoglobin 12.9 (*)    HCT 38.0 (*)    All other components within normal limits  WOUND CULTURE  BASIC METABOLIC PANEL  CK  CBG MONITORING, ED  TYPE AND SCREEN    Imaging Review Dg Lumbar Spine Complete  05/11/2014   CLINICAL DATA:  Fall.  Recent low back surgery.  Low back pain.  EXAM: LUMBAR SPINE - COMPLETE 4+ VIEW  COMPARISON:  Intraoperative imaging 04/19/2014  FINDINGS: Extensive postoperative changes from posterior fusion extending T10-S1. Mild chronic compression fracture at T10. No acute bony abnormality. No hardware complicating feature.  IMPRESSION: No acute bony abnormality.   Electronically Signed   By: Charlett Nose M.D.   On: 05/11/2014 17:21   Mr Lumbar Spine Wo Contrast  05/11/2014   CLINICAL DATA:  Back pain and leg cramps. Fall four days ago. Unable to stand.  EXAM: MRI LUMBAR SPINE WITHOUT  CONTRAST  TECHNIQUE: Multiplanar, multisequence MR imaging of the lumbar spine was performed. No intravenous contrast was administered.  COMPARISON:  Lumbar spine radiographs 05/11/2014. MRI lumbar spine 01/14/2014  FINDINGS: The patient is status post extension of fusion, now extending T10 through S1. A wide laminectomy was performed L2, L3, L4, and L5. Significant artifact is present from the extensive mental.  A large fluid collection fills the space of the laminectomy, measuring 3.9 x 3.7 x 8.9 cm. This creates significant mass effect on the spinal canal from the L2-3 level through L5-S1, left greater than right. This appears to communicate with a heterogeneous subcutaneous collection the a track at the L2 level which measures 2.8 cm in with. The larger subcutaneous collection measures 4.3 x 12.0 by over 20 cm.  A remote T12 fracture is again noted.  IMPRESSION: 1. Large heterogeneous epidural fluid collection creating significant mass effect on the lumbar spine throughout the laminectomy levels from L2 through L5. This is most concerning for hemorrhage or  pus. 2. The collection communicates through to the subcutaneous space at the level of L1. 3. Since the last MRI, the fusion has been extended, now from T10 through S1. 4. The hardware creates significant artifact throughout the lumbar spine. 5. Remote superior endplate fracture of T12 is stable slight retropulsion of bone. Critical Value/emergent results were called by telephone at the time of interpretation on 05/11/2014 at 8:49 pm to Dr. Blane Ohara , who verbally acknowledged these results.   Electronically Signed   By: Gennette Pac M.D.   On: 05/11/2014 20:49     EKG Interpretation None      MDM   Final diagnoses:  Right-sided low back pain without sciatica  Fall, initial encounter  Hypokalemia Epidural fluid collection  Acute on chronic pain since surgery. No signs of severe infection, patient has close followup with neurosurgeon, normal neuro exam in ER. Plan for x-ray to look for signs of fracture, pain meds and close outpatient followup as previously arranged.  X-ray unremarkable.  Plan for discharge however patient's pain minimally improved and difficulty walking patient. Labwork an MRI ordered for further details.  Pain improved on recheck. Radiology called to discuss MRI results which showed significant swelling approximately 20 cm x 12 cm coming from lumbar/postop region.  Discussed the case with Dr. Jordan Likes who evaluated the patient in the ER and plan for surgery..  On recheck pain improved, no focal neuro deficits.  The patients results and plan were reviewed and discussed.   Any x-rays performed were personally reviewed by myself.   Differential diagnosis were considered with the presenting HPI.  Medications  naproxen (NAPROSYN) tablet 500 mg (not administered)  lisinopril (PRINIVIL,ZESTRIL) tablet 20 mg (not administered)  oxyCODONE (Oxy IR/ROXICODONE) immediate release tablet 5-10 mg (not administered)  PARoxetine (PAXIL) tablet 40 mg (not administered)  sodium  chloride 0.9 % injection 3 mL (not administered)  sodium chloride 0.9 % injection 3 mL (not administered)  0.9 %  sodium chloride infusion (not administered)  ceFAZolin (ANCEF) IVPB 1 g/50 mL premix (not administered)  acetaminophen (TYLENOL) tablet 650 mg (not administered)    Or  acetaminophen (TYLENOL) suppository 650 mg (not administered)  oxyCODONE-acetaminophen (PERCOCET/ROXICET) 5-325 MG per tablet 1-2 tablet (not administered)  HYDROmorphone (DILAUDID) injection 0.5-1 mg (not administered)  cyclobenzaprine (FLEXERIL) tablet 10 mg (not administered)  senna (SENOKOT) tablet 8.6 mg (not administered)  ondansetron (ZOFRAN) injection 4 mg (not administered)  alum & mag hydroxide-simeth (MAALOX/MYLANTA) 200-200-20 MG/5ML suspension 30 mL (not  administered)  menthol-cetylpyridinium (CEPACOL) lozenge 3 mg (not administered)    Or  phenol (CHLORASEPTIC) mouth spray 1 spray (not administered)  HYDROmorphone (DILAUDID) 1 MG/ML injection (not administered)  promethazine (PHENERGAN) 25 MG/ML injection (not administered)  ondansetron (ZOFRAN) 4 MG/2ML injection (not administered)  HYDROmorphone (DILAUDID) 1 MG/ML injection (not administered)  HYDROcodone-acetaminophen (NORCO/VICODIN) 5-325 MG per tablet 2 tablet (2 tablets Oral Given 05/11/14 1627)  sodium chloride 0.9 % bolus 1,000 mL (0 mLs Intravenous Stopped 05/11/14 2156)  potassium chloride SA (K-DUR,KLOR-CON) CR tablet 40 mEq (40 mEq Oral Given 05/11/14 2128)  0.9 %  sodium chloride infusion (1,000 mLs Intravenous Transfusing/Transfer 05/11/14 2156)  oxyCODONE (Oxy IR/ROXICODONE) 5 MG immediate release tablet (5 mg  Given 05/12/14 0016)   or  Filed Vitals:   05/12/14 0030 05/12/14 0045 05/12/14 0100 05/12/14 0127  BP: 155/98 143/91 164/95 158/90  Pulse: 80 80 96 90  Temp:   98 F (36.7 C) 98.1 F (36.7 C)  TempSrc:    Oral  Resp: 13 6 12 16   Height:    5\' 9"  (1.753 m)  Weight:    240 lb (108.863 kg)  SpO2: 100% 100% 100% 96%     Admission/ observation were discussed with the admitting physician, patient and/or family and they are comfortable with the plan.         Enid SkeensJoshua M Braxden Lovering, MD 05/12/14 [phone]

## 2014-05-11 NOTE — H&P (Signed)
Devin [name] is an 43 y.o. male.   Chief Complaint: Back pain and leg weakness HPI: 43 year old male approximately 3 weeks status post multilevel lumbar decompression and fusion. Patient presents with intractable back pain with bilateral lower extremity radicular pain and weakness. Workup demonstrates evidence of a large postoperative fluid collection within his lumbar region extending into the epidural space causing marked compression of the thecal sac. Patient without history of fever. No history of wound drainage. No history of CSF leak.  Past Medical History  Diagnosis Date  . Diabetes mellitus without complication     diet contolled  only  . Hypertension     Past Surgical History  Procedure Laterality Date  . Back surgery      x2 surgeries  . Nasal dilation    . Rotator cuff repair    . Posterior lumbar fusion 4 level N/A 04/19/2014    Procedure: LUMBAR TWO-THREE,LUMBAR THREE-FOUR,LUMBAR FOUR-FIVE,LUMBAR FIVE-SACRAL-ONE POSTERIOR LUMBAR INTERBODY FUSION/ADD ON TO THORACIC TEN-LUMBAT TWO FUSION;  Surgeon: Kristeen Miss, MD;  Location: [facility];  Service: Neurosurgery;  Laterality: N/A;    No family history on file. Social History:  reports that he has been smoking.  He does not have any smokeless tobacco history on file. He reports that he does not drink alcohol or use illicit drugs.  Allergies:  Allergies  Allergen Reactions  . Codeine Other (See Comments)    Skin "crawl"  . Hydrocodone Other (See Comments)    Skin "crawl"  . Other Nausea Only and Other (See Comments)    Darvocet     (Not in a hospital admission)  Results for orders placed during the hospital encounter of 05/11/14 (from the past 48 hour(s))  I-STAT CHEM 8, ED     Status: Abnormal   Collection Time    05/11/14  6:50 PM      Result Value Ref Range   Sodium 143  137 - 147 mEq/L   Potassium 3.6 (*) 3.7 - 5.3 mEq/L   Chloride 108  96 - 112 mEq/L   BUN 12  6 - 23 mg/dL   Creatinine, Ser 0.70  0.50 -  1.35 mg/dL   Glucose, Bld 79  70 - 99 mg/dL   Calcium, Ion 1.09 (*) 1.12 - 1.23 mmol/L   TCO2 20  0 - 100 mmol/L   Hemoglobin 12.9 (*) 13.0 - 17.0 g/dL   HCT 38.0 (*) 39.0 - 52.0 %  CBC WITH DIFFERENTIAL     Status: Abnormal   Collection Time    05/11/14  7:00 PM      Result Value Ref Range   WBC 11.7 (*) 4.0 - 10.5 K/uL   RBC 3.54 (*) 4.22 - 5.81 MIL/uL   Hemoglobin 11.6 (*) 13.0 - 17.0 g/dL   HCT 36.3 (*) 39.0 - 52.0 %   MCV 102.5 (*) 78.0 - 100.0 fL   MCH 32.8  26.0 - 34.0 pg   MCHC 32.0  30.0 - 36.0 g/dL   RDW 15.9 (*) 11.5 - 15.5 %   Platelets 489 (*) 150 - 400 K/uL   Neutrophils Relative % 67  43 - 77 %   Neutro Abs 7.8 (*) 1.7 - 7.7 K/uL   Lymphocytes Relative 25  12 - 46 %   Lymphs Abs 2.9  0.7 - 4.0 K/uL   Monocytes Relative 5  3 - 12 %   Monocytes Absolute 0.6  0.1 - 1.0 K/uL   Eosinophils Relative 3  0 - 5 %  Eosinophils Absolute 0.4  0.0 - 0.7 K/uL   Basophils Relative 0  0 - 1 %   Basophils Absolute 0.1  0.0 - 0.1 K/uL  BASIC METABOLIC PANEL     Status: None   Collection Time    05/11/14  7:00 PM      Result Value Ref Range   Sodium 140  137 - 147 mEq/L   Potassium 3.7  3.7 - 5.3 mEq/L   Chloride 103  96 - 112 mEq/L   CO2 23  19 - 32 mEq/L   Glucose, Bld 72  70 - 99 mg/dL   BUN 13  6 - 23 mg/dL   Creatinine, Ser 0.74  0.50 - 1.35 mg/dL   Calcium 8.6  8.4 - 10.5 mg/dL   GFR calc non Af Amer >90  >90 mL/min   GFR calc Af Amer >90  >90 mL/min   Comment: (NOTE)     The eGFR has been calculated using the CKD EPI equation.     This calculation has not been validated in all clinical situations.     eGFR's persistently <90 mL/min signify possible Chronic Kidney     Disease.   Anion gap 14  5 - 15  CK     Status: None   Collection Time    05/11/14  7:00 PM      Result Value Ref Range   Total CK 39  7 - 232 U/L   Dg Lumbar Spine Complete  05/11/2014   CLINICAL DATA:  Fall.  Recent low back surgery.  Low back pain.  EXAM: LUMBAR SPINE - COMPLETE 4+ VIEW   COMPARISON:  Intraoperative imaging 04/19/2014  FINDINGS: Extensive postoperative changes from posterior fusion extending T10-S1. Mild chronic compression fracture at T10. No acute bony abnormality. No hardware complicating feature.  IMPRESSION: No acute bony abnormality.   Electronically Signed   By: Rolm Baptise M.D.   On: 05/11/2014 17:21   Mr Lumbar Spine Wo Contrast  05/11/2014   CLINICAL DATA:  Back pain and leg cramps. Fall four days ago. Unable to stand.  EXAM: MRI LUMBAR SPINE WITHOUT CONTRAST  TECHNIQUE: Multiplanar, multisequence MR imaging of the lumbar spine was performed. No intravenous contrast was administered.  COMPARISON:  Lumbar spine radiographs 05/11/2014. MRI lumbar spine 01/14/2014  FINDINGS: The patient is status post extension of fusion, now extending T10 through S1. A wide laminectomy was performed L2, L3, L4, and L5. Significant artifact is present from the extensive mental.  A large fluid collection fills the space of the laminectomy, measuring 3.9 x 3.7 x 8.9 cm. This creates significant mass effect on the spinal canal from the L2-3 level through L5-S1, left greater than right. This appears to communicate with a heterogeneous subcutaneous collection the a track at the L2 level which measures 2.8 cm in with. The larger subcutaneous collection measures 4.3 x 12.0 by over 20 cm.  A remote T12 fracture is again noted.  IMPRESSION: 1. Large heterogeneous epidural fluid collection creating significant mass effect on the lumbar spine throughout the laminectomy levels from L2 through L5. This is most concerning for hemorrhage or pus. 2. The collection communicates through to the subcutaneous space at the level of L1. 3. Since the last MRI, the fusion has been extended, now from T10 through S1. 4. The hardware creates significant artifact throughout the lumbar spine. 5. Remote superior endplate fracture of N47 is stable slight retropulsion of bone. Critical Value/emergent results were called  by telephone at the  time of interpretation on 05/11/2014 at 8:49 pm to Dr. Elnora Morrison , who verbally acknowledged these results.   Electronically Signed   By: Lawrence Santiago M.D.   On: 05/11/2014 20:49    Pertinent items are noted in HPI.  Blood pressure 157/93, pulse 86, temperature 98.8 F (37.1 C), temperature source Oral, resp. rate 11, height 5' 9"  (1.753 m), weight 108.863 kg (240 lb), SpO2 97.00%.  Awake and alert. Oriented and appropriate. Motor examination intact except slight dorsiflexion weakness bilaterally. Sensory examination is mild sensory loss from L3 distally. Deep tendon reflexes hypoactive. Wound is healing well however there is a large subcutaneous soft fluid collection which is tender to palpation and causes radicular pain with palpation. Chest and abdomen are benign. Extremities are free from injury or deformity. Assessment/Plan Very significant lumbar epidural fluid collection consistent with resolving hematoma with severe spinal stenosis. I discussed situation with the patient. I recommended returned to the operating room with evacuation of hematoma. I've discussed the risks and benefits involved with surgery. Patient is aware and wishes to proceed.  Manahil Vanzile A 05/11/2014, 9:45 PM

## 2014-05-11 NOTE — Op Note (Signed)
Date of procedure: 05/11/2014  Date of dictation: Same  Service: Neurosurgery  Preoperative diagnosis: Postoperative wound hematoma  Postoperative diagnosis: Same  Procedure Name: Reexploration of lumbar wound with evacuation of hematoma  Surgeon:Aikam Hellickson A.Julyssa Kyer, M.D.  Asst. Surgeon: None  Anesthesia: General  Indication: 43 year old male prosthetic three-week status post thoracic/lumbar decompression and fusion with instrumentation. Patient presents with severe back pain and lower extremity pain and weakness. Workup demonstrates evidence of a very large fluid collection consistent with a postoperative hemorrhage with. Patient taken to the operating room for reexploration of his wound.  Operative note: After induction of anesthesia, patient positioned prone on the Wilson frame and appropriately padded. Patient's lumbar region prepped and draped. Incision made overlying the mid point of the lumbar incision. This carried down sharply to the subcutaneous tissues. At this point the hemorrhage cavity was entered and the liquefied hemorrhage under pressure was encountered. This was evacuated with suction. The hemorrhage cavity was debrided. He was irrigated copiously. Cultures were taken. The fascia was opened and one small central area and the epidural space was explored. There did not appear to be significant residual epidural hemorrhage causing any compression. The wounds an area one final time. A medium Hemovac drain was left in the subcutaneous space. Fascia was reapproximated as was the subcutaneous tissue. Skin was closed with 3-0 Vicryl sutures. Steri-Strips triggers were applied. No apparent complications.

## 2014-05-11 NOTE — ED Notes (Signed)
Valuables locked up in security room and home meds sent to pharmacy. Paperwork sent to Neuro OR for pt.

## 2014-05-12 ENCOUNTER — Encounter (HOSPITAL_COMMUNITY): Payer: Self-pay | Admitting: Neurosurgery

## 2014-05-12 LAB — CBG MONITORING, ED: GLUCOSE-CAPILLARY: 79 mg/dL (ref 70–99)

## 2014-05-12 MED ORDER — ONDANSETRON HCL 4 MG/2ML IJ SOLN: 4.0000 mg | INTRAMUSCULAR | Status: DC | PRN

## 2014-05-12 MED ORDER — ONDANSETRON HCL 4 MG/2ML IJ SOLN
INTRAMUSCULAR | Status: AC
  Filled 2014-05-12: qty 2

## 2014-05-12 MED ORDER — SENNA 8.6 MG PO TABS
1.0000 | ORAL_TABLET | Freq: Two times a day (BID) | ORAL | Status: DC
  Administered 2014-05-12 – 2014-05-15 (×8): 8.6 mg via ORAL
  Filled 2014-05-12 (×8): qty 1

## 2014-05-12 MED ORDER — ALUM & MAG HYDROXIDE-SIMETH 200-200-20 MG/5ML PO SUSP: 30.0000 mL | Freq: Four times a day (QID) | ORAL | Status: DC | PRN

## 2014-05-12 MED ORDER — PROMETHAZINE HCL 25 MG/ML IJ SOLN
INTRAMUSCULAR | Status: AC
  Filled 2014-05-12: qty 1

## 2014-05-12 MED ORDER — ACETAMINOPHEN 325 MG PO TABS: 650.0000 mg | ORAL_TABLET | ORAL | Status: DC | PRN

## 2014-05-12 MED ORDER — SODIUM CHLORIDE 0.9 % IJ SOLN: 3.0000 mL | INTRAMUSCULAR | Status: DC | PRN

## 2014-05-12 MED ORDER — OXYCODONE-ACETAMINOPHEN 5-325 MG PO TABS: 1.0000 | ORAL_TABLET | ORAL | Status: DC | PRN

## 2014-05-12 MED ORDER — OXYCODONE-ACETAMINOPHEN 5-325 MG PO TABS
1.0000 | ORAL_TABLET | ORAL | Status: DC | PRN
  Administered 2014-05-12 (×3): 2 via ORAL
  Filled 2014-05-12 (×3): qty 2

## 2014-05-12 MED ORDER — HYDROMORPHONE HCL PF 1 MG/ML IJ SOLN
0.2500 mg | INTRAMUSCULAR | Status: DC | PRN
  Administered 2014-05-12 (×4): 0.5 mg via INTRAVENOUS

## 2014-05-12 MED ORDER — CYCLOBENZAPRINE HCL 10 MG PO TABS
10.0000 mg | ORAL_TABLET | Freq: Three times a day (TID) | ORAL | Status: DC | PRN
  Administered 2014-05-12 – 2014-05-18 (×10): 10 mg via ORAL
  Filled 2014-05-12 (×11): qty 1

## 2014-05-12 MED ORDER — CEFAZOLIN SODIUM 1-5 GM-% IV SOLN
1.0000 g | Freq: Three times a day (TID) | INTRAVENOUS | Status: AC
  Administered 2014-05-12 (×2): 1 g via INTRAVENOUS
  Filled 2014-05-12 (×2): qty 50

## 2014-05-12 MED ORDER — HYDROMORPHONE HCL PF 1 MG/ML IJ SOLN
0.5000 mg | INTRAMUSCULAR | Status: DC | PRN
  Administered 2014-05-12 – 2014-05-13 (×8): 1 mg via INTRAVENOUS
  Filled 2014-05-12 (×8): qty 1

## 2014-05-12 MED ORDER — OXYCODONE HCL 5 MG PO TABS
ORAL_TABLET | ORAL | Status: AC
  Administered 2014-05-12: 5 mg
  Filled 2014-05-12: qty 1

## 2014-05-12 MED ORDER — SODIUM CHLORIDE 0.9 % IJ SOLN
3.0000 mL | Freq: Two times a day (BID) | INTRAMUSCULAR | Status: DC
  Administered 2014-05-12 – 2014-05-19 (×7): 3 mL via INTRAVENOUS

## 2014-05-12 MED ORDER — PAROXETINE HCL 20 MG PO TABS
40.0000 mg | ORAL_TABLET | Freq: Every day | ORAL | Status: DC
  Administered 2014-05-12 – 2014-05-19 (×7): 40 mg via ORAL
  Filled 2014-05-12 (×7): qty 2

## 2014-05-12 MED ORDER — HYDROMORPHONE HCL PF 1 MG/ML IJ SOLN
INTRAMUSCULAR | Status: AC
  Filled 2014-05-12: qty 2

## 2014-05-12 MED ORDER — OXYCODONE HCL 5 MG PO TABS
5.0000 mg | ORAL_TABLET | ORAL | Status: DC | PRN
  Administered 2014-05-12 (×2): 10 mg via ORAL
  Administered 2014-05-12: 5 mg via ORAL
  Administered 2014-05-13: 10 mg via ORAL
  Filled 2014-05-12 (×4): qty 2

## 2014-05-12 MED ORDER — HYDROMORPHONE HCL PF 1 MG/ML IJ SOLN
INTRAMUSCULAR | Status: DC | PRN
  Administered 2014-05-11 – 2014-05-12 (×2): 0.5 mg via INTRAVENOUS

## 2014-05-12 MED ORDER — LISINOPRIL 20 MG PO TABS
20.0000 mg | ORAL_TABLET | Freq: Every day | ORAL | Status: DC
  Administered 2014-05-12 – 2014-05-19 (×7): 20 mg via ORAL
  Filled 2014-05-12 (×7): qty 1

## 2014-05-12 MED ORDER — ACETAMINOPHEN 650 MG RE SUPP: 650.0000 mg | RECTAL | Status: DC | PRN

## 2014-05-12 MED ORDER — NAPROXEN 250 MG PO TABS
500.0000 mg | ORAL_TABLET | Freq: Two times a day (BID) | ORAL | Status: DC
  Administered 2014-05-12 – 2014-05-13 (×3): 500 mg via ORAL
  Filled 2014-05-12 (×3): qty 2

## 2014-05-12 MED ORDER — SODIUM CHLORIDE 0.9 % IV SOLN: 250.0000 mL | INTRAVENOUS | Status: DC

## 2014-05-12 MED ORDER — MENTHOL 3 MG MT LOZG: 1.0000 | LOZENGE | OROMUCOSAL | Status: DC | PRN

## 2014-05-12 MED ORDER — PHENOL 1.4 % MT LIQD: 1.0000 | OROMUCOSAL | Status: DC | PRN

## 2014-05-12 NOTE — Progress Notes (Signed)
UR Completed Peyson Postema Graves-Bigelow, RN,BSN [phone]  

## 2014-05-12 NOTE — Progress Notes (Signed)
Pt transferred to unit from PACU via RN and NT x 1. Pt alert and oriented upon arrival. No signs or symptoms of acute distress. Pt complains of surgical pain, and was medicated. Pt oriented to room and made comfortable. Pt resting in bed at lowest position, and call bell in reach. Will continue to monitor. Delfino Lovettichie Glennis Borger, RN, BSN 05/12/2014 2:05 AM

## 2014-05-12 NOTE — Transfer of Care (Signed)
Immediate Anesthesia Transfer of Care Note  Patient: Devin [name]  Procedure(s) Performed: Procedure(s): Lumbar WOUND EXPLORATION, evcuation of hematoma (N/A)  Patient Location: PACU  Anesthesia Type:General  Level of Consciousness: awake, oriented and patient cooperative  Airway & Oxygen Therapy: Patient Spontanous Breathing and Patient connected to nasal cannula oxygen  Post-op Assessment: Report given to PACU RN, Post -op Vital signs reviewed and stable and Patient moving all extremities X 4  Post vital signs: Reviewed and stable  Complications: No apparent anesthesia complications

## 2014-05-12 NOTE — Progress Notes (Signed)
Pt called RN to room because he felt "wet". Upon entering room and assessing, wound vac was found to be dislodged. Pt as well as bed were cleaned, and new dressing was applied to site. Dr Jordan LikesPool, MD was called and made aware of situation. Instructions given were to fully dislodge the wound vac and reinforce the dressing. Will continue to monitor. Delfino Lovettichie Archita Lomeli, RN, BSN 05/12/2014 5:42 AM

## 2014-05-13 DIAGNOSIS — M549 Dorsalgia, unspecified: Secondary | ICD-10-CM | POA: Diagnosis present

## 2014-05-13 DIAGNOSIS — I621 Nontraumatic extradural hemorrhage: Secondary | ICD-10-CM | POA: Diagnosis present

## 2014-05-13 DIAGNOSIS — Z885 Allergy status to narcotic agent status: Secondary | ICD-10-CM | POA: Diagnosis not present

## 2014-05-13 DIAGNOSIS — Z791 Long term (current) use of non-steroidal anti-inflammatories (NSAID): Secondary | ICD-10-CM | POA: Diagnosis not present

## 2014-05-13 DIAGNOSIS — I10 Essential (primary) hypertension: Secondary | ICD-10-CM | POA: Diagnosis present

## 2014-05-13 DIAGNOSIS — Y838 Other surgical procedures as the cause of abnormal reaction of the patient, or of later complication, without mention of misadventure at the time of the procedure: Secondary | ICD-10-CM | POA: Diagnosis present

## 2014-05-13 DIAGNOSIS — E876 Hypokalemia: Secondary | ICD-10-CM | POA: Diagnosis present

## 2014-05-13 DIAGNOSIS — F17200 Nicotine dependence, unspecified, uncomplicated: Secondary | ICD-10-CM | POA: Diagnosis present

## 2014-05-13 DIAGNOSIS — E119 Type 2 diabetes mellitus without complications: Secondary | ICD-10-CM | POA: Diagnosis present

## 2014-05-13 DIAGNOSIS — IMO0002 Reserved for concepts with insufficient information to code with codable children: Secondary | ICD-10-CM | POA: Diagnosis present

## 2014-05-13 MED ORDER — GABAPENTIN 300 MG PO CAPS
300.0000 mg | ORAL_CAPSULE | Freq: Three times a day (TID) | ORAL | Status: DC
  Administered 2014-05-13 – 2014-05-19 (×17): 300 mg via ORAL
  Filled 2014-05-13 (×7): qty 1
  Filled 2014-05-13: qty 3
  Filled 2014-05-13 (×3): qty 1
  Filled 2014-05-13: qty 3
  Filled 2014-05-13 (×6): qty 1

## 2014-05-13 MED ORDER — CEFAZOLIN SODIUM 1-5 GM-% IV SOLN
1.0000 g | Freq: Three times a day (TID) | INTRAVENOUS | Status: DC
  Administered 2014-05-13 – 2014-05-16 (×10): 1 g via INTRAVENOUS
  Administered 2014-05-16: 2 g via INTRAVENOUS
  Administered 2014-05-16 – 2014-05-19 (×9): 1 g via INTRAVENOUS
  Filled 2014-05-13 (×23): qty 50

## 2014-05-13 MED ORDER — KETOROLAC TROMETHAMINE 15 MG/ML IJ SOLN
15.0000 mg | Freq: Four times a day (QID) | INTRAMUSCULAR | Status: AC
  Administered 2014-05-13 – 2014-05-14 (×5): 15 mg via INTRAVENOUS
  Filled 2014-05-13 (×5): qty 1

## 2014-05-13 MED ORDER — OXYCODONE-ACETAMINOPHEN 5-325 MG PO TABS
1.0000 | ORAL_TABLET | ORAL | Status: DC | PRN
  Administered 2014-05-13 (×5): 2 via ORAL
  Administered 2014-05-13: 1 via ORAL
  Administered 2014-05-14: 2 via ORAL
  Filled 2014-05-13 (×8): qty 2

## 2014-05-13 MED ORDER — OXYCODONE HCL 5 MG PO TABS
5.0000 mg | ORAL_TABLET | ORAL | Status: DC | PRN
  Administered 2014-05-13 – 2014-05-19 (×23): 10 mg via ORAL
  Filled 2014-05-13 (×24): qty 2

## 2014-05-13 NOTE — Progress Notes (Signed)
Subjective: Patient reports Complains of considerable back and leg soreness. Ambulated only 75 feet yesterday maximal  Objective: Vital signs in last 24 hours: Temp:  [97.9 F (36.6 C)-98.3 F (36.8 C)] 97.9 F (36.6 C) (08/14 0520) Pulse Rate:  [72-92] 72 (08/14 0520) Resp:  [16] 16 (08/14 0520) BP: (143-166)/(83-98) 154/94 mmHg (08/14 0520) SpO2:  [96 %-100 %] 100 % (08/14 0520)  Intake/Output from previous day: 08/13 0701 - 08/14 0700 In: 3 [I.V.:3] Out: 225 [Urine:225] Intake/Output this shift:    Incision is soft fluctuant normal reaccumulation of fluid appears apparent.  Lab Results:  Recent Labs  05/11/14 1850 05/11/14 1900  WBC  --  11.7*  HGB 12.9* 11.6*  HCT 38.0* 36.3*  PLT  --  489*   BMET  Recent Labs  05/11/14 1850 05/11/14 1900  NA 143 140  K 3.6* 3.7  CL 108 103  CO2  --  23  GLUCOSE 79 72  BUN 12 13  CREATININE 0.70 0.74  CALCIUM  --  8.6    Studies/Results: Dg Lumbar Spine Complete  05/11/2014   CLINICAL DATA:  Fall.  Recent low back surgery.  Low back pain.  EXAM: LUMBAR SPINE - COMPLETE 4+ VIEW  COMPARISON:  Intraoperative imaging 04/19/2014  FINDINGS: Extensive postoperative changes from posterior fusion extending T10-S1. Mild chronic compression fracture at T10. No acute bony abnormality. No hardware complicating feature.  IMPRESSION: No acute bony abnormality.   Electronically Signed   By: Charlett NoseKevin  Dover M.D.   On: 05/11/2014 17:21   Mr Lumbar Spine Wo Contrast  05/11/2014   CLINICAL DATA:  Back pain and leg cramps. Fall four days ago. Unable to stand.  EXAM: MRI LUMBAR SPINE WITHOUT CONTRAST  TECHNIQUE: Multiplanar, multisequence MR imaging of the lumbar spine was performed. No intravenous contrast was administered.  COMPARISON:  Lumbar spine radiographs 05/11/2014. MRI lumbar spine 01/14/2014  FINDINGS: The patient is status post extension of fusion, now extending T10 through S1. A wide laminectomy was performed L2, L3, L4, and L5.  Significant artifact is present from the extensive mental.  A large fluid collection fills the space of the laminectomy, measuring 3.9 x 3.7 x 8.9 cm. This creates significant mass effect on the spinal canal from the L2-3 level through L5-S1, left greater than right. This appears to communicate with a heterogeneous subcutaneous collection the a track at the L2 level which measures 2.8 cm in with. The larger subcutaneous collection measures 4.3 x 12.0 by over 20 cm.  A remote T12 fracture is again noted.  IMPRESSION: 1. Large heterogeneous epidural fluid collection creating significant mass effect on the lumbar spine throughout the laminectomy levels from L2 through L5. This is most concerning for hemorrhage or pus. 2. The collection communicates through to the subcutaneous space at the level of L1. 3. Since the last MRI, the fusion has been extended, now from T10 through S1. 4. The hardware creates significant artifact throughout the lumbar spine. 5. Remote superior endplate fracture of T12 is stable slight retropulsion of bone. Critical Value/emergent results were called by telephone at the time of interpretation on 05/11/2014 at 8:49 pm to Dr. Blane OharaJOSHUA ZAVITZ , who verbally acknowledged these results.   Electronically Signed   By: Gennette Pachris  Mattern M.D.   On: 05/11/2014 20:49    Assessment/Plan: Stable post evacuation of hematoma with considerable radicular pain.  LOS: 2 days  Continue IV Ancef for now change the patient's pain medications for every 3 hours control regimen added Toradol. Discuss  plans for discharge and patient notes that he would like to be able to walk better before he gets to home. Will continue to follow along here in the hospital.   Stefani Dama 05/13/2014, 8:50 AM

## 2014-05-14 LAB — WOUND CULTURE: Culture: NO GROWTH

## 2014-05-14 MED ORDER — OXYCODONE HCL ER 40 MG PO T12A
40.0000 mg | EXTENDED_RELEASE_TABLET | Freq: Two times a day (BID) | ORAL | Status: DC
  Administered 2014-05-14 – 2014-05-19 (×10): 40 mg via ORAL
  Filled 2014-05-14 (×3): qty 4
  Filled 2014-05-14: qty 1
  Filled 2014-05-14 (×4): qty 4
  Filled 2014-05-14: qty 1
  Filled 2014-05-14 (×2): qty 4

## 2014-05-14 NOTE — Progress Notes (Signed)
Extra Cefazolin sent up from Pharmacy returned to refrigerator.

## 2014-05-14 NOTE — Progress Notes (Signed)
Subjective: Patient reports pain in legs  Objective: Vital signs in last 24 hours: Temp:  [98 F (36.7 C)-98.6 F (37 C)] 98.6 F (37 C) (08/15 1000) Pulse Rate:  [78-93] 93 (08/15 1000) Resp:  [16-20] 18 (08/15 1000) BP: (139-157)/(78-101) 157/98 mmHg (08/15 1000) SpO2:  [95 %-100 %] 100 % (08/15 1000)  Intake/Output from previous day: 08/14 0701 - 08/15 0700 In: -  Out: 500 [Urine:500] Intake/Output this shift:    Physical Exam: Soft fluid collection on back, non-tender or erythematous.  Patient complains of leg pain, but has full strength in both lower extremities.  Lab Results:  Recent Labs  05/11/14 1850 05/11/14 1900  WBC  --  11.7*  HGB 12.9* 11.6*  HCT 38.0* 36.3*  PLT  --  489*   BMET  Recent Labs  05/11/14 1850 05/11/14 1900  NA 143 140  K 3.6* 3.7  CL 108 103  CO2  --  23  GLUCOSE 79 72  BUN 12 13  CREATININE 0.70 0.74  CALCIUM  --  8.6    Studies/Results: No results found.  Assessment/Plan: I have adjusted pain meds and started oxycontin 40 mg BID with oxyIR as needed.  He is on gabapentin.  Hopefully, this will give him smoother pain control.    LOS: 3 days    Dorian HeckleSTERN,Bertha Lokken D, MD 05/14/2014, 12:49 PM

## 2014-05-15 ENCOUNTER — Inpatient Hospital Stay (HOSPITAL_COMMUNITY): Payer: BC Managed Care – PPO

## 2014-05-15 MED ORDER — GADOBENATE DIMEGLUMINE 529 MG/ML IV SOLN
20.0000 mL | Freq: Once | INTRAVENOUS | Status: AC | PRN
  Administered 2014-05-15: 20 mL via INTRAVENOUS

## 2014-05-15 MED ORDER — HYDROMORPHONE HCL PF 1 MG/ML IJ SOLN
1.0000 mg | Freq: Once | INTRAMUSCULAR | Status: AC
  Administered 2014-05-15: 1 mg via INTRAVENOUS

## 2014-05-15 MED ORDER — HYDROMORPHONE HCL PF 1 MG/ML IJ SOLN
1.0000 mg | Freq: Once | INTRAMUSCULAR | Status: AC
  Administered 2014-05-15: 1 mg via INTRAVENOUS
  Filled 2014-05-15: qty 1

## 2014-05-15 MED ORDER — HYDROMORPHONE HCL PF 1 MG/ML IJ SOLN
INTRAMUSCULAR | Status: AC
  Filled 2014-05-15: qty 1

## 2014-05-15 NOTE — Progress Notes (Signed)
Orthopedic Tech Progress Note Patient Details:  Devin [name] 05/08/1971 [id number] Lumbar corsett delivered Patient ID: Devin [name], male   DOB: 05/30/1971, 43 y.o.   MRN: [id number]   Devin [name] 05/15/2014, 12:14 PM

## 2014-05-15 NOTE — Progress Notes (Addendum)
Patient ID: Devin [name], male   DOB: 04/12/1971, 43 y.o.   MRN: [id number] Patient has been having significant pain, this despite starting Neurontin and changing his pain medicines per Dr. Venetia MaxonStern yesterday. His incision demonstrates fullness with some modest fluctuance. Fluid collection appears to be recurring.  I've advised that we should do an MRI scan of the lumbar spine. Also believe the patient can start to mobilize without brace but I will order a new brace for him. I'll discuss with him further intervention once I see the MRI. His back and leg pain is characteristically severe. He has not had any fevers nor does the wound have any changes to suggest infection.

## 2014-05-16 ENCOUNTER — Encounter (HOSPITAL_COMMUNITY)
Admission: EM | Disposition: A | Discharge status: Home / Self Care | Payer: Self-pay | Source: Home / Self Care | Attending: Neurological Surgery

## 2014-05-16 ENCOUNTER — Encounter (HOSPITAL_COMMUNITY): Payer: Self-pay | Admitting: Certified Registered Nurse Anesthetist

## 2014-05-16 ENCOUNTER — Inpatient Hospital Stay (HOSPITAL_COMMUNITY): Payer: BC Managed Care – PPO | Admitting: Anesthesiology

## 2014-05-16 ENCOUNTER — Encounter (HOSPITAL_COMMUNITY): Payer: BC Managed Care – PPO | Admitting: Anesthesiology

## 2014-05-16 HISTORY — PX: LUMBAR LAMINECTOMY/DECOMPRESSION MICRODISCECTOMY: SHX5026

## 2014-05-16 LAB — GLUCOSE, CAPILLARY
Glucose-Capillary: 97 mg/dL (ref 70–99)
Glucose-Capillary: 91 mg/dL (ref 70–99)

## 2014-05-16 SURGERY — LUMBAR LAMINECTOMY/DECOMPRESSION MICRODISCECTOMY 2 LEVELS
Anesthesia: General | Site: Back

## 2014-05-16 MED ORDER — KETOROLAC TROMETHAMINE 30 MG/ML IJ SOLN
30.0000 mg | Freq: Four times a day (QID) | INTRAMUSCULAR | Status: DC
  Administered 2014-05-16 – 2014-05-19 (×12): 30 mg via INTRAVENOUS
  Filled 2014-05-16 (×12): qty 1

## 2014-05-16 MED ORDER — ARTIFICIAL TEARS OP OINT
TOPICAL_OINTMENT | OPHTHALMIC | Status: DC | PRN
  Administered 2014-05-16: 1 via OPHTHALMIC

## 2014-05-16 MED ORDER — NEOSTIGMINE METHYLSULFATE 10 MG/10ML IV SOLN
INTRAVENOUS | Status: DC | PRN
  Administered 2014-05-16: 4 mg via INTRAVENOUS

## 2014-05-16 MED ORDER — OXYCODONE HCL 5 MG/5ML PO SOLN: 5.0000 mg | Freq: Once | ORAL | Status: DC | PRN

## 2014-05-16 MED ORDER — ALUM & MAG HYDROXIDE-SIMETH 200-200-20 MG/5ML PO SUSP
30.0000 mL | Freq: Four times a day (QID) | ORAL | Status: DC | PRN
Start: 2014-05-16 — End: 2014-05-19

## 2014-05-16 MED ORDER — SODIUM CHLORIDE 0.9 % IR SOLN
Status: DC | PRN
  Administered 2014-05-16: 11:00:00

## 2014-05-16 MED ORDER — HYDROMORPHONE HCL PF 1 MG/ML IJ SOLN
0.5000 mg | INTRAMUSCULAR | Status: DC | PRN
  Administered 2014-05-16 – 2014-05-19 (×5): 1 mg via INTRAVENOUS
  Filled 2014-05-16 (×5): qty 1

## 2014-05-16 MED ORDER — HYDROMORPHONE HCL PF 1 MG/ML IJ SOLN
0.2500 mg | INTRAMUSCULAR | Status: DC | PRN
  Administered 2014-05-16 (×3): 0.5 mg via INTRAVENOUS

## 2014-05-16 MED ORDER — MIDAZOLAM HCL 2 MG/2ML IJ SOLN
INTRAMUSCULAR | Status: AC
  Filled 2014-05-16: qty 2

## 2014-05-16 MED ORDER — PROPOFOL 10 MG/ML IV BOLUS
INTRAVENOUS | Status: AC
  Filled 2014-05-16: qty 20

## 2014-05-16 MED ORDER — GLYCOPYRROLATE 0.2 MG/ML IJ SOLN
INTRAMUSCULAR | Status: AC
  Filled 2014-05-16: qty 3

## 2014-05-16 MED ORDER — OXYCODONE-ACETAMINOPHEN 5-325 MG PO TABS
1.0000 | ORAL_TABLET | ORAL | Status: DC | PRN
  Administered 2014-05-16 – 2014-05-19 (×6): 2 via ORAL
  Filled 2014-05-16 (×7): qty 2

## 2014-05-16 MED ORDER — MEPERIDINE HCL 25 MG/ML IJ SOLN: 6.2500 mg | INTRAMUSCULAR | Status: DC | PRN

## 2014-05-16 MED ORDER — FENTANYL CITRATE 0.05 MG/ML IJ SOLN
INTRAMUSCULAR | Status: DC | PRN
  Administered 2014-05-16: 250 ug via INTRAVENOUS

## 2014-05-16 MED ORDER — OXYCODONE HCL 5 MG PO TABS: 5.0000 mg | ORAL_TABLET | Freq: Once | ORAL | Status: DC | PRN

## 2014-05-16 MED ORDER — DOCUSATE SODIUM 100 MG PO CAPS
100.0000 mg | ORAL_CAPSULE | Freq: Two times a day (BID) | ORAL | Status: DC
  Administered 2014-05-16 – 2014-05-19 (×7): 100 mg via ORAL
  Filled 2014-05-16 (×7): qty 1

## 2014-05-16 MED ORDER — BISACODYL 10 MG RE SUPP: 10.0000 mg | Freq: Every day | RECTAL | Status: DC | PRN

## 2014-05-16 MED ORDER — HEMOSTATIC AGENTS (NO CHARGE) OPTIME
TOPICAL | Status: DC | PRN
  Administered 2014-05-16: 1 via TOPICAL

## 2014-05-16 MED ORDER — SODIUM CHLORIDE 0.9 % IJ SOLN: 3.0000 mL | INTRAMUSCULAR | Status: DC | PRN

## 2014-05-16 MED ORDER — ONDANSETRON HCL 4 MG/2ML IJ SOLN
INTRAMUSCULAR | Status: DC | PRN
  Administered 2014-05-16: 4 mg via INTRAVENOUS

## 2014-05-16 MED ORDER — POLYETHYLENE GLYCOL 3350 17 G PO PACK: 17.0000 g | PACK | Freq: Every day | ORAL | Status: DC | PRN

## 2014-05-16 MED ORDER — LIDOCAINE HCL (CARDIAC) 20 MG/ML IV SOLN
INTRAVENOUS | Status: AC
  Filled 2014-05-16: qty 5

## 2014-05-16 MED ORDER — MIDAZOLAM HCL 2 MG/2ML IJ SOLN: 0.5000 mg | Freq: Once | INTRAMUSCULAR | Status: DC | PRN

## 2014-05-16 MED ORDER — ACETAMINOPHEN 650 MG RE SUPP: 650.0000 mg | RECTAL | Status: DC | PRN

## 2014-05-16 MED ORDER — CEFAZOLIN SODIUM-DEXTROSE 2-3 GM-% IV SOLR
INTRAVENOUS | Status: AC
  Filled 2014-05-16: qty 50

## 2014-05-16 MED ORDER — CEFAZOLIN SODIUM 1-5 GM-% IV SOLN
1.0000 g | Freq: Three times a day (TID) | INTRAVENOUS | Status: DC
  Filled 2014-05-16 (×2): qty 50

## 2014-05-16 MED ORDER — SODIUM CHLORIDE 0.9 % IV SOLN
INTRAVENOUS | Status: DC
  Administered 2014-05-16: 15:00:00 via INTRAVENOUS

## 2014-05-16 MED ORDER — HYDROMORPHONE HCL PF 1 MG/ML IJ SOLN
INTRAMUSCULAR | Status: AC
  Filled 2014-05-16: qty 1

## 2014-05-16 MED ORDER — 0.9 % SODIUM CHLORIDE (POUR BTL) OPTIME
TOPICAL | Status: DC | PRN
  Administered 2014-05-16: 1000 mL

## 2014-05-16 MED ORDER — NEOSTIGMINE METHYLSULFATE 10 MG/10ML IV SOLN
INTRAVENOUS | Status: AC
  Filled 2014-05-16: qty 1

## 2014-05-16 MED ORDER — GLYCOPYRROLATE 0.2 MG/ML IJ SOLN
INTRAMUSCULAR | Status: DC | PRN
  Administered 2014-05-16: 0.6 mg via INTRAVENOUS

## 2014-05-16 MED ORDER — PHENYLEPHRINE 40 MCG/ML (10ML) SYRINGE FOR IV PUSH (FOR BLOOD PRESSURE SUPPORT)
PREFILLED_SYRINGE | INTRAVENOUS | Status: AC
  Filled 2014-05-16: qty 10

## 2014-05-16 MED ORDER — DEXTROSE 5 % IV SOLN
500.0000 mg | Freq: Four times a day (QID) | INTRAVENOUS | Status: DC | PRN
  Filled 2014-05-16: qty 5

## 2014-05-16 MED ORDER — ACETAMINOPHEN 325 MG PO TABS: 650.0000 mg | ORAL_TABLET | ORAL | Status: DC | PRN

## 2014-05-16 MED ORDER — FENTANYL CITRATE 0.05 MG/ML IJ SOLN
INTRAMUSCULAR | Status: AC
  Filled 2014-05-16: qty 5

## 2014-05-16 MED ORDER — ONDANSETRON HCL 4 MG/2ML IJ SOLN
INTRAMUSCULAR | Status: AC
  Filled 2014-05-16: qty 2

## 2014-05-16 MED ORDER — PHENOL 1.4 % MT LIQD: 1.0000 | OROMUCOSAL | Status: DC | PRN

## 2014-05-16 MED ORDER — PROPOFOL 10 MG/ML IV BOLUS
INTRAVENOUS | Status: DC | PRN
  Administered 2014-05-16: 200 mg via INTRAVENOUS

## 2014-05-16 MED ORDER — SENNA 8.6 MG PO TABS
1.0000 | ORAL_TABLET | Freq: Two times a day (BID) | ORAL | Status: DC
  Administered 2014-05-16 – 2014-05-19 (×7): 8.6 mg via ORAL
  Filled 2014-05-16 (×7): qty 1

## 2014-05-16 MED ORDER — METHOCARBAMOL 500 MG PO TABS
500.0000 mg | ORAL_TABLET | Freq: Four times a day (QID) | ORAL | Status: DC | PRN
  Administered 2014-05-19: 500 mg via ORAL
  Filled 2014-05-16: qty 1

## 2014-05-16 MED ORDER — PROMETHAZINE HCL 25 MG/ML IJ SOLN
6.2500 mg | INTRAMUSCULAR | Status: DC | PRN
  Administered 2014-05-16: 6.25 mg via INTRAVENOUS

## 2014-05-16 MED ORDER — FLEET ENEMA 7-19 GM/118ML RE ENEM: 1.0000 | ENEMA | Freq: Once | RECTAL | Status: AC | PRN

## 2014-05-16 MED ORDER — SODIUM CHLORIDE 0.9 % IV SOLN: 250.0000 mL | INTRAVENOUS | Status: DC

## 2014-05-16 MED ORDER — THROMBIN 5000 UNITS EX SOLR
CUTANEOUS | Status: DC | PRN
  Administered 2014-05-16 (×2): 5000 [IU] via TOPICAL

## 2014-05-16 MED ORDER — PROMETHAZINE HCL 25 MG/ML IJ SOLN
INTRAMUSCULAR | Status: AC
Start: 2014-05-16 — End: 2014-05-17
  Filled 2014-05-16: qty 1

## 2014-05-16 MED ORDER — ROCURONIUM BROMIDE 50 MG/5ML IV SOLN
INTRAVENOUS | Status: AC
  Filled 2014-05-16: qty 1

## 2014-05-16 MED ORDER — ONDANSETRON HCL 4 MG/2ML IJ SOLN: 4.0000 mg | INTRAMUSCULAR | Status: DC | PRN

## 2014-05-16 MED ORDER — SODIUM CHLORIDE 0.9 % IJ SOLN
3.0000 mL | Freq: Two times a day (BID) | INTRAMUSCULAR | Status: DC
  Administered 2014-05-17 – 2014-05-18 (×4): 3 mL via INTRAVENOUS

## 2014-05-16 MED ORDER — MIDAZOLAM HCL 5 MG/5ML IJ SOLN
INTRAMUSCULAR | Status: DC | PRN
  Administered 2014-05-16: 2 mg via INTRAVENOUS

## 2014-05-16 MED ORDER — LACTATED RINGERS IV SOLN
INTRAVENOUS | Status: DC | PRN
  Administered 2014-05-16 (×2): via INTRAVENOUS

## 2014-05-16 MED ORDER — MENTHOL 3 MG MT LOZG: 1.0000 | LOZENGE | OROMUCOSAL | Status: DC | PRN

## 2014-05-16 MED ORDER — HYDROMORPHONE HCL PF 1 MG/ML IJ SOLN
INTRAMUSCULAR | Status: DC | PRN
  Administered 2014-05-16: 1 mg via INTRAVENOUS

## 2014-05-16 MED ORDER — PHENYLEPHRINE HCL 10 MG/ML IJ SOLN
INTRAMUSCULAR | Status: DC | PRN
  Administered 2014-05-16: 80 ug via INTRAVENOUS
  Administered 2014-05-16: 40 ug via INTRAVENOUS
  Administered 2014-05-16: 80 ug via INTRAVENOUS
  Administered 2014-05-16 (×2): 40 ug via INTRAVENOUS

## 2014-05-16 MED ORDER — ROCURONIUM BROMIDE 100 MG/10ML IV SOLN
INTRAVENOUS | Status: DC | PRN
  Administered 2014-05-16: 40 mg via INTRAVENOUS

## 2014-05-16 MED ORDER — LIDOCAINE HCL (CARDIAC) 20 MG/ML IV SOLN
INTRAVENOUS | Status: DC | PRN
  Administered 2014-05-16: 20 mg via INTRAVENOUS

## 2014-05-16 SURGICAL SUPPLY — 60 items
BAG DECANTER FOR FLEXI CONT (MISCELLANEOUS) ×3 IMPLANT
BLADE SURG ROTATE 9660 (MISCELLANEOUS) IMPLANT
BUR ACORN 6.0 (BURR) IMPLANT
BUR ACORN 6.0MM (BURR)
BUR MATCHSTICK NEURO 3.0 LAGG (BURR) ×3 IMPLANT
CANISTER SUCT 3000ML (MISCELLANEOUS) ×3 IMPLANT
CONT SPEC 4OZ CLIKSEAL STRL BL (MISCELLANEOUS) ×3 IMPLANT
DECANTER SPIKE VIAL GLASS SM (MISCELLANEOUS) ×3 IMPLANT
DERMABOND ADHESIVE PROPEN (GAUZE/BANDAGES/DRESSINGS) ×2
DERMABOND ADVANCED (GAUZE/BANDAGES/DRESSINGS)
DERMABOND ADVANCED .7 DNX12 (GAUZE/BANDAGES/DRESSINGS) IMPLANT
DERMABOND ADVANCED .7 DNX6 (GAUZE/BANDAGES/DRESSINGS) ×1 IMPLANT
DRAPE LAPAROTOMY 100X72X124 (DRAPES) ×3 IMPLANT
DRAPE MICROSCOPE LEICA (MISCELLANEOUS) IMPLANT
DRAPE POUCH INSTRU U-SHP 10X18 (DRAPES) ×3 IMPLANT
DRAPE PROXIMA HALF (DRAPES) IMPLANT
DRSG OPSITE POSTOP 3X4 (GAUZE/BANDAGES/DRESSINGS) ×3 IMPLANT
DRSG OPSITE POSTOP 4X6 (GAUZE/BANDAGES/DRESSINGS) ×3 IMPLANT
DURAPREP 26ML APPLICATOR (WOUND CARE) ×3 IMPLANT
ELECT REM PT RETURN 9FT ADLT (ELECTROSURGICAL) ×3
ELECTRODE REM PT RTRN 9FT ADLT (ELECTROSURGICAL) ×1 IMPLANT
EVACUATOR 3/16  PVC DRAIN (DRAIN) ×2
EVACUATOR 3/16 PVC DRAIN (DRAIN) ×1 IMPLANT
GAUZE SPONGE 4X4 12PLY STRL (GAUZE/BANDAGES/DRESSINGS) ×3 IMPLANT
GAUZE SPONGE 4X4 16PLY XRAY LF (GAUZE/BANDAGES/DRESSINGS) IMPLANT
GLOVE BIOGEL PI IND STRL 7.5 (GLOVE) ×1 IMPLANT
GLOVE BIOGEL PI IND STRL 8.5 (GLOVE) ×1 IMPLANT
GLOVE BIOGEL PI INDICATOR 7.5 (GLOVE) ×2
GLOVE BIOGEL PI INDICATOR 8.5 (GLOVE) ×2
GLOVE ECLIPSE 8.5 STRL (GLOVE) ×3 IMPLANT
GLOVE EXAM NITRILE LRG STRL (GLOVE) IMPLANT
GLOVE EXAM NITRILE MD LF STRL (GLOVE) IMPLANT
GLOVE EXAM NITRILE XL STR (GLOVE) IMPLANT
GLOVE EXAM NITRILE XS STR PU (GLOVE) IMPLANT
GLOVE SS BIOGEL STRL SZ 7 (GLOVE) ×2 IMPLANT
GLOVE SUPERSENSE BIOGEL SZ 7 (GLOVE) ×4
GOWN STRL REUS W/ TWL LRG LVL3 (GOWN DISPOSABLE) IMPLANT
GOWN STRL REUS W/ TWL XL LVL3 (GOWN DISPOSABLE) IMPLANT
GOWN STRL REUS W/TWL 2XL LVL3 (GOWN DISPOSABLE) ×3 IMPLANT
GOWN STRL REUS W/TWL LRG LVL3 (GOWN DISPOSABLE)
GOWN STRL REUS W/TWL XL LVL3 (GOWN DISPOSABLE)
KIT BASIN OR (CUSTOM PROCEDURE TRAY) ×3 IMPLANT
KIT ROOM TURNOVER OR (KITS) ×3 IMPLANT
NEEDLE HYPO 22GX1.5 SAFETY (NEEDLE) ×3 IMPLANT
NEEDLE SPNL 20GX3.5 QUINCKE YW (NEEDLE) IMPLANT
NS IRRIG 1000ML POUR BTL (IV SOLUTION) ×3 IMPLANT
PACK LAMINECTOMY NEURO (CUSTOM PROCEDURE TRAY) ×3 IMPLANT
PAD ARMBOARD 7.5X6 YLW CONV (MISCELLANEOUS) ×9 IMPLANT
PATTIES SURGICAL .5 X1 (DISPOSABLE) ×3 IMPLANT
RUBBERBAND STERILE (MISCELLANEOUS) IMPLANT
SPONGE SURGIFOAM ABS GEL SZ50 (HEMOSTASIS) ×3 IMPLANT
SUT ETHILON 3 0 FSL (SUTURE) ×3 IMPLANT
SUT VIC AB 1 CT1 18XBRD ANBCTR (SUTURE) ×1 IMPLANT
SUT VIC AB 1 CT1 8-18 (SUTURE) ×2
SUT VIC AB 2-0 CP2 18 (SUTURE) ×3 IMPLANT
SUT VIC AB 3-0 SH 8-18 (SUTURE) ×6 IMPLANT
SYR 20ML ECCENTRIC (SYRINGE) ×3 IMPLANT
TOWEL OR 17X24 6PK STRL BLUE (TOWEL DISPOSABLE) ×3 IMPLANT
TOWEL OR 17X26 10 PK STRL BLUE (TOWEL DISPOSABLE) ×3 IMPLANT
WATER STERILE IRR 1000ML POUR (IV SOLUTION) ×3 IMPLANT

## 2014-05-16 NOTE — Transfer of Care (Signed)
Immediate Anesthesia Transfer of Care Note  Patient: Devin [name]  Procedure(s) Performed: Procedure(s) with comments: Repeat evacuation of lumbar hematoma (N/A) - Repeat evacuation of lumbar hematoma  Patient Location: PACU  Anesthesia Type:General  Level of Consciousness: awake, alert , oriented and patient cooperative  Airway & Oxygen Therapy: Patient Spontanous Breathing and Patient connected to nasal cannula oxygen  Post-op Assessment: Report given to PACU RN, Post -op Vital signs reviewed and stable and Patient moving all extremities X 4  Post vital signs: Reviewed and stable  Complications: No apparent anesthesia complications

## 2014-05-16 NOTE — Progress Notes (Signed)
Patient ID: [name], male   DOB: 08/18/1971, 43 y.o.   MRN: [id number] Alert and oriented. Back and leg pain is better the legs are still achy. Dressing is clean and dry. Stable postop Dr. Wynetta Emerycram will follow patient in my absence.

## 2014-05-16 NOTE — Progress Notes (Signed)
PT Cancellation Note  Patient Details Name: Devin [name] MRN: [id number] DOB: 02/25/1971   Cancelled Treatment:    Reason Eval/Treat Not Completed: Medical issues which prohibited therapy (Just out of surgery)   INGOLD,Shavana Calder 05/16/2014, 3:16 PM  [facility] In[facility],PT Acute Rehabilitation [phone] (pager)

## 2014-05-16 NOTE — Anesthesia Postprocedure Evaluation (Signed)
  Anesthesia Post-op Note  Patient: Devin DomeJerry [name]  Procedure(s) Performed: Procedure(s) with comments: Repeat evacuation of lumbar hematoma (N/A) - Repeat evacuation of lumbar hematoma  Patient Location: PACU  Anesthesia Type:General  Level of Consciousness: awake, alert , oriented and patient cooperative  Airway and Oxygen Therapy: Patient Spontanous Breathing  Post-op Pain: mild  Post-op Assessment: Post-op Vital signs reviewed, Patient's Cardiovascular Status Stable, Respiratory Function Stable, Patent Airway, No signs of Nausea or vomiting and Pain level controlled  Post-op Vital Signs: Reviewed and stable  Last Vitals:  Filed Vitals:   05/16/14 1330  BP: 138/85  Pulse: 82  Temp: 36.9 C  Resp: 13    Complications: No apparent anesthesia complications

## 2014-05-16 NOTE — Progress Notes (Signed)
Pt c/o of pain in legs, Dr Danielle DessElsner paged and notified,ordered to give iv toradol 30mg  every 6hrs same commenced at 0501, pt reassured, Obasogie-Asidi, Rachelann Enloe Efe

## 2014-05-16 NOTE — Progress Notes (Signed)
Patient ID: [name], male   DOB: 09/04/1971, 43 y.o.   MRN: [id number] Vital signs stable. MRI reviewed and it demonstrates that there is a reaccumulation of blood similar to what was previously noted. Patient is very miserable with pain Will return to the operating room for surgical drainage this morning.

## 2014-05-16 NOTE — Anesthesia Preprocedure Evaluation (Addendum)
Anesthesia Evaluation  Patient identified by MRN, date of birth, ID band Patient awake    Reviewed: Allergy & Precautions, H&P , NPO status , Patient's Chart, lab work & pertinent test results  History of Anesthesia Complications Negative for: history of anesthetic complications  Airway Mallampati: III TM Distance: >3 FB Neck ROM: Full    Dental  (+) Teeth Intact, Dental Advisory Given   Pulmonary Current Smoker,  breath sounds clear to auscultation        Cardiovascular hypertension, Pt. on medications - anginaRhythm:Regular Rate:Normal     Neuro/Psych Chronic back pain: narcotics    GI/Hepatic negative GI ROS, Neg liver ROS,   Endo/Other  diabetes (diet controlled)Morbid obesity  Renal/GU negative Renal ROS     Musculoskeletal   Abdominal (+) + obese,   Peds  Hematology  (+) Blood dyscrasia (Hb 11.6), ,   Anesthesia Other Findings   Reproductive/Obstetrics                          Anesthesia Physical Anesthesia Plan  ASA: II  Anesthesia Plan: General   Post-op Pain Management:    Induction: Intravenous  Airway Management Planned: Oral ETT  Additional Equipment:   Intra-op Plan:   Post-operative Plan: Extubation in OR  Informed Consent: I have reviewed the patients History and Physical, chart, labs and discussed the procedure including the risks, benefits and alternatives for the proposed anesthesia with the patient or authorized representative who has indicated his/her understanding and acceptance.   Dental advisory given  Plan Discussed with: CRNA and Surgeon  Anesthesia Plan Comments: (Plan routine monitors, GETA)        Anesthesia Quick Evaluation

## 2014-05-16 NOTE — Progress Notes (Signed)
UR completed 

## 2014-05-16 NOTE — Evaluation (Signed)
Occupational Therapy Evaluation Patient Details Name: Devin [name] MRN: [id number] DOB: 05/18/1971 Today's Date: 05/16/2014    History of Present Illness 43 year old male approximately 3 weeks status post multilevel lumbar decompression and fusion. Repeat evacuation of lumbar hematoma    Clinical Impression   Pt s/p above. Feel [name] benefit from acute OT to increase independence prior to d/c. Recommending HHOT upon d/c. Pt does not have anyone with him 24/7.    Follow Up Recommendations  Home health OT;Supervision - Intermittent    Equipment Recommendations  3 in 1 bedside comode    Recommendations for Other Services       Precautions / Restrictions Precautions Precautions: Back;Fall Precaution Comments: Reviewed precautions Required Braces or Orthoses: Spinal Brace Spinal Brace: Lumbar corset;Applied in sitting position Restrictions Weight Bearing Restrictions: No      Mobility Bed Mobility Overal bed mobility: Needs Assistance Bed Mobility: Rolling;Sidelying to Sit;Sit to Sidelying Rolling: Modified independent (Device/Increase time) Sidelying to sit: Supervision     Sit to sidelying: Modified independent (Device/Increase time)    Transfers Overall transfer level: Needs assistance Equipment used: Rolling walker (2 wheeled) Transfers: Sit to/from Stand Sit to Stand: Min guard         General transfer comment: Min guard for safety.    Balance                                            ADL Overall ADL's : Needs assistance/impaired                     Lower Body Dressing: Sit to/from stand;Minimal assistance   Toilet Transfer: Min guard;Ambulation;RW (bed)   Toileting- Clothing Manipulation and Hygiene: Moderate assistance;Sit to/from stand       Functional mobility during ADLs: Min guard;Rolling walker General ADL Comments: Discussed AE for LB ADLs and techniques for LB ADLs. Recommended sitting for LB ADLs. Discussed  tub shower transfer briefly. Educated on use of cup for teeth care and placement of grooming items to avoid breaking precautions. Educated on use of bag on walker as well as back brace. Educated on what pt could use for toilet aid for hygiene.     Vision                     Perception     Praxis      Pertinent Vitals/Pain Pain Assessment: 0-10 Pain Score: 9  Pain Location: back Pain Descriptors / Indicators: Dull;Sharp;Aching Pain Intervention(s): Repositioned;Monitored during session;Other (comment) (nurse notified)     Hand Dominance Right   Extremity/Trunk Assessment Upper Extremity Assessment Upper Extremity Assessment: Overall WFL for tasks assessed   Lower Extremity Assessment Lower Extremity Assessment: Defer to PT evaluation       Communication Communication Communication: No difficulties   Cognition Arousal/Alertness: Awake/alert Behavior During Therapy: WFL for tasks assessed/performed Overall Cognitive Status: Within Functional Limits for tasks assessed                     General Comments       Exercises       Shoulder Instructions      Home Living Family/patient expects to be discharged to:: Private residence Living Arrangements: Alone Available Help at Discharge: Neighbor;Available PRN/intermittently Type of Home: Mobile home Home Access: Stairs to enter Entrance Stairs-Number of Steps: 9 Entrance Stairs-Rails: Right;Left;Can reach both  Home Layout: One level     Bathroom Shower/Tub: Tub/shower unit Shower/tub characteristics: Engineer, building services: Standard (window near)     Home Equipment: Environmental consultant - 2 wheels          Prior Functioning/Environment Level of Independence: Needs assistance    ADL's / Homemaking Assistance Needed: neighbors would assist with LB bathing.        OT Diagnosis: Acute pain;Generalized weakness   OT Problem List: Decreased strength;Decreased activity tolerance;Decreased knowledge of use  of DME or AE;Decreased knowledge of precautions;Pain   OT Treatment/Interventions: Self-care/ADL training;DME and/or AE instruction;Therapeutic activities;Patient/family education;Balance training    OT Goals(Current goals can be found in the care plan section) Acute Rehab OT Goals Patient Stated Goal: not stated OT Goal Formulation: With patient Time For Goal Achievement: 05/23/14 Potential to Achieve Goals: Good ADL Goals [name] Perform Lower Body Bathing: with modified independence;sit to/from stand [name] Perform Lower Body Dressing: with modified independence;sit to/from stand [name] Transfer to Toilet: with modified independence;ambulating;regular height toilet;grab bars [name] Perform Toileting - Clothing Manipulation and hygiene: with modified independence;sit to/from stand [name] Perform Tub/Shower Transfer: Tub transfer;with supervision;ambulating;rolling walker (shower DME tbd)  OT Frequency: Min 2X/week   Barriers to D/C: Decreased caregiver support          Co-evaluation              End of Session Equipment Utilized During Treatment: Gait belt;Rolling walker;Back brace Nurse Communication: Other (comment) (pain)  Activity Tolerance: Patient limited by pain;Other (comment) (dizzy) Patient left: in bed;with call bell/phone within reach   Time: 1517-1540 OT Time Calculation (min): 23 min Charges:  OT General Charges $OT Visit: 1 Procedure OT Evaluation $Initial OT Evaluation Tier I: 1 Procedure OT Treatments $Self Care/Home Management : 8-22 mins G-CodesEarlie [name] OTR/L [phone] 05/16/2014, 4:09 PM

## 2014-05-16 NOTE — Progress Notes (Signed)
Got a call from Dr Danielle DessElsner to make pt NPO for surgery in the morning based on pt's MRI result,same explained to pt,had tab oxycodone 10mg  at 0124, pt reassured, will continue to monitor. Obasogie-Asidi, Adylin Hankey Efe

## 2014-05-16 NOTE — Op Note (Signed)
Date of surgery: 05/16/2014 Preoperative diagnosis: Recurrent postoperative hematoma lumbar spine Postoperative diagnosis: Recurrent postoperative hematoma lumbar spine Procedure: Drainage of postoperative recurrent lumbar wound hematoma Surgeon: Barnett AbuHenry Arvo Ealy Anesthesia: Gen. endotracheal Indications: Mr. Debby BudGerry Guisinger is a 43 year old individual who underwent lumbar spine surgery. He's had previous decompression and fusion of the thoracic a lumbar burst fracture and it subsequently he deteriorated the discs are 3 levels below namely L2-3 L3-4 L4-5. He underwent surgical treatment of this process about 3 weeks ago. 2 weeks ago he sustained a fall a couple days later he complained of significant back pain with the swelling in his back which proved to be a lumbar wound hematoma. He is taken to the operating room where he underwent surgical drainage. During the procedure a external drain was placed however this came out of his back approximately 6 hours after the surgery. Over the next few days he felt fairly well but then he reaccumulated a significant mass in his lumbar spine and MRI confirm the presence of a large subcutaneous and epidural hematoma. His note returned to the operating room for a second drainage procedure.  Procedure: The patient was brought to the operating room supine on a stretcher after the smooth induction of general endotracheal anesthesia he was turned prone. The back was prepped with alcohol and DuraPrep. The previously made incision was opened with a 15 blade and the subcuticular stitches were identified and cut. Dissection was then performed with a pair Metzenbaum scissors and immediately a subcutaneous cavity of bloody fluid was encountered. Cultures were obtained from this collection. Further dissection yielded mostly bloody fluid in the subcutaneous space. The underlying fascia initially appeared close however in the inferior portion of it was identified an opening where there was  clot protruding through this region this was opened and there was noted to be substantial organized hematoma in this layer under the muscular fascia. This was then opened and debris did more fully. The epidural space was inspected. There was clot adherent to the dura and this was evacuated and dissected free using a Cytogeneticistenfield dissector or and a coronary probe. The central canal and lateral recesses including each of the nerves from L2 down to L5 were dissected free. There is not noted be any significant points of bleeding were encountered. The dura was noted to be intact. Area was copiously irrigated with antibiotic irrigating solution and when after multiple inspections no bleeding points were found it was decided to leave a large Hemovac drain down to the epidural space. This was brought out through separate stab incision. Then the lumbar dorsal fascia was closed with #1 Vicryl in interrupted fashion 2-0 Vicryl was used in the subcutaneous tissues and 3-0 Vicryl was used to close subcuticular skin again the drain was secured this time with 3-0 nylon suture. A dry dressing was applied after Dermabond was applied on the midline incision. Patient tolerated procedure well blood loss was minimal the total volume of the hematoma that was evacuated was 350 cc for the liquid component approximately 75 cc for the organized hematoma component.

## 2014-05-17 LAB — CBC
HCT: 29.1 % — ABNORMAL LOW (ref 39.0–52.0)
HEMOGLOBIN: 9.5 g/dL — AB (ref 13.0–17.0)
MCH: 33 pg (ref 26.0–34.0)
MCHC: 32.6 g/dL (ref 30.0–36.0)
MCV: 101 fL — ABNORMAL HIGH (ref 78.0–100.0)
Platelets: 348 10*3/uL (ref 150–400)
RBC: 2.88 MIL/uL — ABNORMAL LOW (ref 4.22–5.81)
RDW: 14.7 % (ref 11.5–15.5)
WBC: 8.9 10*3/uL (ref 4.0–10.5)

## 2014-05-17 NOTE — Plan of Care (Signed)
Problem: Phase I Progression Outcomes Goal: OOB as tolerated unless otherwise ordered Outcome: Progressing Patient out of bed to chair with PT.

## 2014-05-17 NOTE — Evaluation (Addendum)
Physical Therapy Evaluation Patient Details Name: Devin [name] MRN: [id number] DOB: 02/21/1971 Today's Date: 05/17/2014   History of Present Illness  43 year old male approximately 3 weeks status post multilevel lumbar decompression and fusion. Repeat evacuation of lumbar hematoma   Clinical Impression  Patient is s/p above surgery resulting in the deficits listed below (see PT Problem List). Pt with good recall of back precautions and good ambulation tolerance. Patient will benefit from skilled PT to increase their independence and safety with mobility (while adhering to their precautions) to allow discharge. Pt reports neighbors to help him PRN.     Follow Up Recommendations Supervision - Intermittent;Home health PT    Equipment Recommendations  None recommended by PT    Recommendations for Other Services       Precautions / Restrictions Precautions Precautions: Back;Fall Precaution Booklet Issued: No Precaution Comments: pt able to recall 3/3 precautions Required Braces or Orthoses: Spinal Brace Spinal Brace: Lumbar corset;Applied in sitting position Restrictions Weight Bearing Restrictions: No      Mobility  Bed Mobility Overal bed mobility: Modified Independent Bed Mobility: Rolling;Sidelying to Sit Rolling: Modified independent (Device/Increase time) Sidelying to sit: Supervision       General bed mobility comments: v/c's to not twist when in bed  Transfers Overall transfer level: Needs assistance Equipment used: Rolling walker (2 wheeled) Transfers: Sit to/from Stand Sit to Stand: Supervision         General transfer comment: pt demo'd good technique  Ambulation/Gait Ambulation/Gait assistance: Min guard Ambulation Distance (Feet): 150 Feet Assistive device: Rolling walker (2 wheeled) Gait Pattern/deviations: Step-through pattern Gait velocity: decreased Gait velocity interpretation: Below normal speed for age/gender General Gait Details: increased  bilat UE WBing, v/c's to stay in walker and achieve upright posture  Stairs            Wheelchair Mobility    Modified Rankin (Stroke Patients Only)       Balance Overall balance assessment:  (requires RW for safe amb)                                           Pertinent Vitals/Pain Pain Assessment: 0-10 Pain Score:  (3 at rest, 8 with walking) Pain Location: back Pain Intervention(s):  (notified RN)    Home Living Family/patient expects to be discharged to:: Private residence Living Arrangements: Alone Available Help at Discharge: Neighbor;Available PRN/intermittently Type of Home: Mobile home Home Access: Stairs to enter Entrance Stairs-Rails: Right;Left;Can reach both Entrance Stairs-Number of Steps: 9 Home Layout: One level Home Equipment: Walker - 2 wheels Additional Comments: tub shower with curtain, standard toilets    Prior Function Level of Independence: Needs assistance   Gait / Transfers Assistance Needed: indep without device  ADL's / Homemaking Assistance Needed: neighbors can assist with grocery shopping and cooking        Hand Dominance   Dominant Hand: Right    Extremity/Trunk Assessment   Upper Extremity Assessment: Defer to OT evaluation           Lower Extremity Assessment: Overall WFL for tasks assessed (however R top of foot numbness)      Cervical / Trunk Assessment:  (back surgery)  Communication   Communication: No difficulties  Cognition Arousal/Alertness: Awake/alert Behavior During Therapy: WFL for tasks assessed/performed Overall Cognitive Status: Within Functional Limits for tasks assessed  General Comments      Exercises        Assessment/Plan    PT Assessment Patient needs continued PT services  PT Diagnosis Difficulty walking;Abnormality of gait;Acute pain   PT Problem List Decreased activity tolerance;Decreased balance;Decreased mobility;Pain  PT  Treatment Interventions DME instruction;Gait training;Stair training;Functional mobility training;Therapeutic activities;Therapeutic exercise;Balance training;Patient/family education   PT Goals (Current goals can be found in the Care Plan section) Acute Rehab PT Goals Patient Stated Goal: not stated PT Goal Formulation: With patient Time For Goal Achievement: 05/24/14 Potential to Achieve Goals: Good    Frequency Min 5X/week   Barriers to discharge        Co-evaluation               End of Session Equipment Utilized During Treatment: Gait belt;Back brace Activity Tolerance: Patient tolerated treatment well Patient left: in chair;with call bell/phone within reach Nurse Communication: Mobility status (encouraged RN staff to amb with pt today)         Time: 1610-9604 PT Time Calculation (min): 22 min   Charges:   PT Evaluation $Initial PT Evaluation Tier I: 1 Procedure PT Treatments $Gait Training: 8-22 mins   PT G CodesMarcene Brawn 05/17/2014, 9:00 AM   Lewis Shock, PT, DPT Pager #: [phone] Office #: [phone]

## 2014-05-17 NOTE — Progress Notes (Signed)
Subjective: Patient reports sole of back and leg pain but better than before surgery yesterday  Objective: Vital signs in last 24 hours: Temp:  [98.1 F (36.7 C)-98.5 F (36.9 C)] 98.3 F (36.8 C) (08/18 0500) Pulse Rate:  [78-95] 88 (08/18 0500) Resp:  [13-26] 20 (08/18 0500) BP: (127-157)/(69-94) 157/81 mmHg (08/18 0500) SpO2:  [93 %-100 %] 97 % (08/18 0500)  Intake/Output from previous day: 08/17 0701 - 08/18 0700 In: 1645.5 [P.O.:118; I.V.:1527.5] Out: 1005 [Urine:775; Drains:180; Blood:50] Intake/Output this shift: Total I/O In: 527.5 [I.V.:527.5] Out: 955 [Urine:775; Drains:180]  ambulatory work on pain control continue Hemovac drainage possible discharge tomorrow Strength 5 out of 5 wound clean and dry Lab Results:  Recent Labs  05/17/14 0506  WBC 8.9  HGB 9.5*  HCT 29.1*  PLT 348   BMET No results found for this basename: NA, K, CL, CO2, GLUCOSE, BUN, CREATININE, CALCIUM,  in the last 72 hours  Studies/Results: Mr Lumbar Spine W Wo Contrast  05/15/2014   ADDENDUM REPORT: 05/15/2014 19:53  ADDENDUM: Critical Value/emergent results were called by telephone at the time of interpretation on 05/15/2014 at 7:50 PM to Dr. Barnett AbuHENRY ELSNER , who verbally acknowledged these results.   Electronically Signed   By: Geanie CooleyJim  Maxwell M.D.   On: 05/15/2014 19:53   05/15/2014   CLINICAL DATA:  Recent evacuation of epidural hematoma. Recurrent severe back pain.  EXAM: MRI LUMBAR SPINE WITHOUT AND WITH CONTRAST  TECHNIQUE: Multiplanar and multiecho pulse sequences of the lumbar spine were obtained without and with intravenous contrast.  CONTRAST:  20mL MULTIHANCE GADOBENATE DIMEGLUMINE 529 MG/ML IV SOLN  COMPARISON:  MRI dated 05/11/2014  FINDINGS: There has been re-accumulation of the large epidural hematoma at the a operative site extending from L2-L5. The posterior epidural hematoma measures 8.7 x 3.9 x 4.1 cm. The thecal sac is markedly compressed from the inferior aspect of L2 through the  mid L5 level.  There is also a re-accumulation of a subcutaneous fluid collection which originates from the superior aspect of the epidural hematoma and extends superiorly in the subcutaneous soft tissues in the midline of the back to approximately the T11-12 level and then extends inferiorly to the S4 segment. Disc collection measures 22.4 x 11.0 x 3.1 cm.  Detail in the spinal canal is partially obscured by the pedicle screws. Alignment of the vertebra appears anatomic. Old compression fracture of the superior aspect of T12.  IMPRESSION: Re-accumulation of prominent midline epidural hematoma at the operative site from L2 to L5. Re-accumulation of subcutaneous extension of the hematoma in the midline just deep to the incision.  Electronically Signed: By: Geanie CooleyJim  Maxwell M.D. On: 05/15/2014 19:40    Assessment/Plan: Still requiring Hemovac aggressive mobilization today possible discharged on   LOS: 6 days     Deajah Erkkila P 05/17/2014, 6:42 AM

## 2014-05-17 NOTE — Progress Notes (Addendum)
Occupational Therapy Treatment Patient Details Name: Devin [name] MRN: [id number] DOB: November 13, 1970 Today's Date: 05/17/2014    History of present illness 43 year old male approximately 3 weeks status post multilevel lumbar decompression and fusion. Repeat evacuation of lumbar hematoma    OT comments  Pt progressing. Education provided during session.   Follow Up Recommendations  Home health OT;Supervision - Intermittent    Equipment Recommendations  3 in 1 bedside comode;Tub/shower seat  (flat shower chair with back)   Recommendations for Other Services      Precautions / Restrictions Precautions Precautions: Back;Fall Precaution Booklet Issued: Yes (comment) Precaution Comments: pt able to recall 3/3 precautions Required Braces or Orthoses: Spinal Brace Spinal Brace: Lumbar corset;Applied in sitting position Restrictions Weight Bearing Restrictions: No       Mobility Bed Mobility Overal bed mobility: Needs Assistance Bed Mobility: Rolling;Sit to Sidelying Rolling: Supervision       Sit to sidelying: Supervision General bed mobility comments: Cues to log roll  Transfers Overall transfer level: Needs assistance Equipment used: Rolling walker (2 wheeled) Transfers: Sit to/from Stand Sit to Stand: Supervision              Balance                                   ADL Overall ADL's : Needs assistance/impaired     Grooming: Wash/dry face;Oral care;Set up;Supervision/safety;Standing   Upper Body Bathing: Set up;Supervision/ safety;Standing (wash/dry hair with cloth; wiped under arms)       Upper Body Dressing : Supervision/safety;Sitting (doffed back brace)       Toilet Transfer: Min guard;Ambulation;RW (chair/bed)       Tub/ Shower Transfer: Min guard;Ambulation;Rolling walker;3 in 1   Functional mobility during ADLs: Moderate assistance;Rolling walker General ADL Comments: Reviewed use of up for teeth care and placement of grooming  items to avoid breaking precautions. Pt able to cross legs for LB ADLs. Discussed how he could use AE if it became an issue. Discussed incorporating back precautions into ADLs/functional activities. Pt able to state what to use for hygiene for toilet aid.  Discussed tub transfer options and what pt could use for shower chair. Recommended someone be with him for tub transfer. Educated on technique for car transfer. handout given of back information. Reviewed back brace information. Educated on Psychologist, prison and probation services.      Vision                     Perception     Praxis      Cognition   Behavior During Therapy: WFL for tasks assessed/performed Overall Cognitive Status: Within Functional Limits for tasks assessed                       Extremity/Trunk Assessment               Exercises     Shoulder Instructions       General Comments      Pertinent Vitals/ Pain       Pain Assessment: 0-10 Pain Score: 10-Worst pain ever Pain Location: right leg  Pain Descriptors / Indicators: Aching;Throbbing Pain Intervention(s): Repositioned;Other (comment) (nurse notified for pain meds)  Home Living  Prior Functioning/Environment              Frequency Min 2X/week     Progress Toward Goals  OT Goals(current goals can now be found in the care plan section)  Progress towards OT goals: Progressing toward goals  Acute Rehab OT Goals Patient Stated Goal: not stated OT Goal Formulation: With patient Time For Goal Achievement: 05/23/14 Potential to Achieve Goals: Good ADL Goals Pt Will Perform Grooming: with modified independence;standing  Plan Discharge plan remains appropriate    Co-evaluation                 End of Session Equipment Utilized During Treatment: Gait belt;Rolling walker;Back brace   Activity Tolerance Patient tolerated treatment well   Patient Left in bed;with call bell/phone within  reach   Nurse Communication Patient requests pain meds        Time: [id number] OT Time Calculation (min): 27 min  Charges: OT General Charges $OT Visit: 1 Procedure OT Treatments $Self Care/Home Management : 8-22 mins $Therapeutic Activity: 8-22 mins  Earlie RavelingStraub, Maddilyn Campus L OTR/L [phone] 05/17/2014, 2:15 PM

## 2014-05-18 ENCOUNTER — Encounter (HOSPITAL_COMMUNITY): Payer: Self-pay | Admitting: Neurological Surgery

## 2014-05-18 NOTE — Progress Notes (Signed)
Subjective: Patient reports Doing well still with back and leg pain unchanged slightly better from preop  Objective: Vital signs in last 24 hours: Temp:  [98 F (36.7 C)-99.1 F (37.3 C)] 98.1 F (36.7 C) (08/19 1031) Pulse Rate:  [92-97] 92 (08/19 1031) Resp:  [18-20] 18 (08/19 1031) BP: (110-151)/(64-90) 151/90 mmHg (08/19 1031) SpO2:  [96 %-100 %] 100 % (08/19 1031)  Intake/Output from previous day: 08/18 0701 - 08/19 0700 In: 800 [P.O.:800] Out: 120 [Drains:120] Intake/Output this shift: Total I/O In: 800 [P.O.:800] Out: -   Strength out of 5 wound clean dry and intact  Lab Results:  Recent Labs  05/17/14 0506  WBC 8.9  HGB 9.5*  HCT 29.1*  PLT 348   BMET No results found for this basename: NA, K, CL, CO2, GLUCOSE, BUN, CREATININE, CALCIUM,  in the last 72 hours  Studies/Results: No results found.  Assessment/Plan: Continue to work with pain management physical occupational therapy to get his Hemovac possible discharge tomorrow  LOS: 7 days     Meranda Dechaine P 05/18/2014, 1:21 PM

## 2014-05-18 NOTE — Progress Notes (Addendum)
Physical Therapy Treatment Patient Details Name: Devin DomeJerry Mitrano MRN: [id number] DOB: 06/23/1971 Today's Date: 05/18/2014    History of Present Illness 43 year old male approximately 3 weeks status post multilevel lumbar decompression and fusion. Repeat evacuation of lumbar hematoma     PT Comments    Pt progressing well with mobility. Demos decr activity tolerance and requires sitting rest breaks to maximize functional mobility due to pain. C/o bil hip and LE pain. Will cont to follow per POC. Pt will need to be MOD I for safe D/C due to living alone. Encouraged daily ambulation around unit with nursing, pt verbalized understanding.   Follow Up Recommendations  Supervision - Intermittent;Home health PT     Equipment Recommendations  None recommended by PT    Recommendations for Other Services       Precautions / Restrictions Precautions Precautions: Back Precaution Comments: pt able to recall 3/3 precautions and lifting restrictions  Required Braces or Orthoses: Spinal Brace Spinal Brace: Lumbar corset;Applied in sitting position Restrictions Weight Bearing Restrictions: No    Mobility  Bed Mobility               General bed mobility comments: not addressed; pt up in chair  Transfers Overall transfer level: Needs assistance Equipment used: Rolling walker (2 wheeled) Transfers: Sit to/from Stand Sit to Stand: Supervision         General transfer comment: supervision for min cues for precautions; progressing to mod I quickly  Ambulation/Gait Ambulation/Gait assistance: Supervision Ambulation Distance (Feet): 400 Feet Assistive device: Rolling walker (2 wheeled) Gait Pattern/deviations: Step-through pattern Gait velocity: decreased Gait velocity interpretation: Below normal speed for age/gender General Gait Details: pt required 2 sitting rest breaks with ambulation; min cues for upright posture; RW adjusted to proper height for pt   Stairs Stairs:  Yes Stairs assistance: Supervision Stair Management: Two rails;Step to pattern;Forwards Number of Stairs: 4 General stair comments: cues for sequencing; pt demo good ability to recall and perform safely  Wheelchair Mobility    Modified Rankin (Stroke Patients Only)       Balance Overall balance assessment: Needs assistance Sitting-balance support: Feet supported;No upper extremity supported Sitting balance-Leahy Scale: Good     Standing balance support: During functional activity;No upper extremity supported Standing balance-Leahy Scale: Fair Standing balance comment: stood minimal amount of time without UE support; no LOB noted                    Cognition Arousal/Alertness: Awake/alert Behavior During Therapy: WFL for tasks assessed/performed Overall Cognitive Status: Within Functional Limits for tasks assessed                      Exercises      General Comments General comments (skin integrity, edema, etc.): discussed home setup for safety and education regarding back precautions       Pertinent Vitals/Pain Pain Assessment: 0-10 Pain Score: 8  Pain Location: bil hips and LEs  Pain Descriptors / Indicators: Sharp;Aching Pain Intervention(s): Monitored during session;Premedicated before session;Patient requesting pain meds-RN notified;Ice applied    Home Living                      Prior Function            PT Goals (current goals can now be found in the care plan section) Acute Rehab PT Goals Patient Stated Goal: to not have this hip pain and go home saturday PT Goal Formulation: With patient Time  For Goal Achievement: 05/24/14 Potential to Achieve Goals: Good Progress towards PT goals: Progressing toward goals    Frequency  Min 5X/week    PT Plan Current plan remains appropriate    Co-evaluation             End of Session Equipment Utilized During Treatment: Gait belt;Back brace Activity Tolerance: Patient tolerated  treatment well Patient left: in chair;with call bell/phone within reach;with nursing/sitter in room     Time: 1610-9604 PT Time Calculation (min): 25 min  Charges:  Automatic Data Training: 23-37 mins                    G CodesDonnamarie Poag [place], [place]  [phone] 05/18/2014, 10:22 AM

## 2014-05-19 MED ORDER — OXYCODONE HCL 15 MG PO TABS: 5.0000 mg | ORAL_TABLET | ORAL | Status: DC | PRN

## 2014-05-19 NOTE — Discharge Summary (Signed)
  Physician Discharge Summary  Patient ID: Devin [name] MRN: [id number] DOB/AGE: 45/04/1971 45 y.o.  Admit date: 05/11/2014 Discharge date: 05/19/2014  Admission Diagnoses:epidural hematoma  Discharge Diagnoses: same Active Problems:   Postoperative hematoma   Discharged Condition: good  Hospital Course: patient is a 43 year old gentleman who had undergone a thoracic lumbar stabilization procedure at 3 weeks postop is an emergent R. With increased pain and weakness workup revealed asurgical wound hematomathe patient was emergently to the operating room for I&D of lumbar wound and evacuation of hematoma. Patient convalesced well the floor with initial first few days after surgery however start expressing increased back pain and was taken back to the operating room for another reexploration of his lumbar wound and removal hematoma and postop day 3 from the second reexploration patient was angling and voiding spontaneously tolerating rigid diet pain was well controlled on pills and was stable for discharge home. He was scheduled followup approximately one week.  Consults: Significant Diagnostic Studies: Treatments:reexploration of lumbar wound for hematoma Discharge Exam: Blood pressure 148/94, pulse 86, temperature 98.2 F (36.8 C), temperature source Oral, resp. rate 20, height 5\' 9"  (1.753 m), weight 108.863 kg (240 lb), SpO2 98.00%. Strength out of 5 wound clean dry and  Disposition: home     Medication List         acetaminophen 500 MG tablet  Commonly known as:  TYLENOL  Take 2,000 mg by mouth every 6 (six) hours as needed for moderate pain or headache.     lisinopril 20 MG tablet  Commonly known as:  PRINIVIL,ZESTRIL  Take 1 tablet (20 mg total) by mouth daily.     naproxen 500 MG tablet  Commonly known as:  NAPROSYN  Take 500 mg by mouth 2 (two) times daily with a meal.     oxyCODONE 5 MG immediate release tablet  Commonly known as:  Oxy IR/ROXICODONE  Take 1-2  tablets (5-10 mg total) by mouth every 4 (four) hours as needed for severe pain.     oxyCODONE 15 MG immediate release tablet  Commonly known as:  ROXICODONE  Take 0.5 tablets (7.5 mg total) by mouth every 4 (four) hours as needed for severe pain.     oxyCODONE-acetaminophen 5-325 MG per tablet  Commonly known as:  PERCOCET/ROXICET  Take 1 tablet by mouth every 4 (four) hours as needed for moderate pain.     oxyCODONE-acetaminophen 5-325 MG per tablet  Commonly known as:  PERCOCET  Take 2 tablets by mouth every 4 (four) hours as needed.     PARoxetine 40 MG tablet  Commonly known as:  PAXIL  Take 1 tablet (40 mg total) by mouth every morning.     Potassium 99 MG Tabs  Take 99 mg by mouth 2 (two) times daily.           Follow-up Information   Follow up with [facility]    . Call in 3 days.   Contact information:   [address] [place] [id number] [phone]      Follow up with Stefani DamaELSNER,HENRY J, MD.   Specialty:  Neurosurgery   Contact information:   [address] [place] [address] [phone]       Signed: Mariam DollarCRAM,Emile Kyllo P 05/19/2014, 7:14 AM

## 2014-05-19 NOTE — Progress Notes (Signed)
Patient's belongings returned from the ED and his medicines were returned from pharmacy.  Patient going home via ride from friend.  Lance BoschAnna Auria Mckinlay, RN

## 2014-05-19 NOTE — Progress Notes (Signed)
Patient ID: [name], male   DOB: 02/10/1971, 43 y.o.   MRN: [id number] Doing better pain is better control plan discharge home

## 2014-05-19 NOTE — Discharge Instructions (Signed)
See your surgeon tomorrow as planned. Return for ER for leg weakness, persistent numbness, bowel or bladder changes, fevers or new concerns. For severe pain take norco or vicodin however realize they have the potential for addiction and it can make you sleepy and has tylenol in it.  No operating machinery while taking.  If you were given medicines take as directed.  If you are on coumadin or contraceptives realize their levels and effectiveness is altered by many different medicines.  If you have any reaction (rash, tongues swelling, other) to the medicines stop taking and see a physician.   Please follow up as directed and return to the ER or see a physician for new or worsening symptoms.  Thank you. Filed Vitals:   05/11/14 1430 05/11/14 1500 05/11/14 1545 05/11/14 1615  BP: 153/97 124/80 141/80 147/89  Pulse: 96     Temp:      TempSrc:      Resp:      Height:      Weight:      SpO2: 99%      No lifting no bending no twisting no driving a riding a car less discomfort for to see Dr. Danielle DessElsner

## 2014-05-19 NOTE — Progress Notes (Signed)
Patient ready for discharge and IV removed.  Prescription given for oxy IR 15mg .  Patient education and paperwork completed.  Patient has belongings locked in the ER but no key is in his chart.  I have called security and am awaiting an answer.  Lance BoschAnna Daiveon Markman,. RN

## 2014-05-20 LAB — CULTURE, ROUTINE-ABSCESS: Culture: NO GROWTH

## 2014-05-20 NOTE — Anesthesia Postprocedure Evaluation (Signed)
Anesthesia Post Note  Patient: Devin [name]  Procedure(s) Performed: Procedure(s) (LRB): Lumbar WOUND EXPLORATION, evcuation of hematoma (N/A)  Anesthesia type: General  Patient location: PACU  Post pain: Pain level controlled and Adequate analgesia  Post assessment: Post-op Vital signs reviewed, Patient's Cardiovascular Status Stable, Respiratory Function Stable, Patent Airway and Pain level controlled  Last Vitals:  Filed Vitals:   05/19/14 1040  BP: 139/58  Pulse: 89  Temp: 36.9 C  Resp: 18    Post vital signs: Reviewed and stable  Level of consciousness: awake, alert  and oriented  Complications: No apparent anesthesia complications

## 2014-05-21 LAB — ANAEROBIC CULTURE

## 2014-05-23 ENCOUNTER — Inpatient Hospital Stay (HOSPITAL_COMMUNITY)
Admission: EM | Admit: 2014-05-23 | Discharge: 2014-05-30 | DRG: 948 | Disposition: A | Discharge status: Home / Self Care | Payer: BC Managed Care – PPO | Attending: Neurological Surgery | Admitting: Neurological Surgery

## 2014-05-23 ENCOUNTER — Encounter (HOSPITAL_COMMUNITY): Payer: Self-pay | Admitting: Emergency Medicine

## 2014-05-23 DIAGNOSIS — I10 Essential (primary) hypertension: Secondary | ICD-10-CM | POA: Diagnosis present

## 2014-05-23 DIAGNOSIS — F17200 Nicotine dependence, unspecified, uncomplicated: Secondary | ICD-10-CM | POA: Diagnosis present

## 2014-05-23 DIAGNOSIS — G8918 Other acute postprocedural pain: Principal | ICD-10-CM | POA: Diagnosis present

## 2014-05-23 DIAGNOSIS — M545 Low back pain, unspecified: Secondary | ICD-10-CM | POA: Diagnosis present

## 2014-05-23 DIAGNOSIS — E119 Type 2 diabetes mellitus without complications: Secondary | ICD-10-CM | POA: Diagnosis present

## 2014-05-23 DIAGNOSIS — T888XXA Other specified complications of surgical and medical care, not elsewhere classified, initial encounter: Secondary | ICD-10-CM | POA: Diagnosis present

## 2014-05-23 DIAGNOSIS — T888XXD Other specified complications of surgical and medical care, not elsewhere classified, subsequent encounter: Secondary | ICD-10-CM

## 2014-05-23 DIAGNOSIS — Y838 Other surgical procedures as the cause of abnormal reaction of the patient, or of later complication, without mention of misadventure at the time of the procedure: Secondary | ICD-10-CM | POA: Diagnosis present

## 2014-05-23 DIAGNOSIS — M79609 Pain in unspecified limb: Secondary | ICD-10-CM | POA: Diagnosis present

## 2014-05-23 DIAGNOSIS — Z981 Arthrodesis status: Secondary | ICD-10-CM

## 2014-05-23 DIAGNOSIS — F411 Generalized anxiety disorder: Secondary | ICD-10-CM | POA: Diagnosis present

## 2014-05-23 HISTORY — DX: Anxiety disorder, unspecified: F41.9

## 2014-05-23 MED ORDER — PAROXETINE HCL 20 MG PO TABS
40.0000 mg | ORAL_TABLET | Freq: Every day | ORAL | Status: DC
  Administered 2014-05-24 – 2014-05-30 (×7): 40 mg via ORAL
  Filled 2014-05-23 (×7): qty 2

## 2014-05-23 MED ORDER — ONDANSETRON HCL 4 MG PO TABS: 4.0000 mg | ORAL_TABLET | Freq: Four times a day (QID) | ORAL | Status: DC | PRN

## 2014-05-23 MED ORDER — POLYETHYLENE GLYCOL 3350 17 G PO PACK: 17.0000 g | PACK | Freq: Every day | ORAL | Status: DC | PRN

## 2014-05-23 MED ORDER — OXYCODONE HCL 5 MG PO TABS
5.0000 mg | ORAL_TABLET | ORAL | Status: DC | PRN
  Administered 2014-05-23 – 2014-05-30 (×31): 5 mg via ORAL
  Filled 2014-05-23 (×32): qty 1

## 2014-05-23 MED ORDER — DOCUSATE SODIUM 100 MG PO CAPS
100.0000 mg | ORAL_CAPSULE | Freq: Two times a day (BID) | ORAL | Status: DC
  Administered 2014-05-24 – 2014-05-30 (×13): 100 mg via ORAL
  Filled 2014-05-23 (×13): qty 1

## 2014-05-23 MED ORDER — DEXAMETHASONE SODIUM PHOSPHATE 4 MG/ML IJ SOLN
4.0000 mg | Freq: Four times a day (QID) | INTRAMUSCULAR | Status: DC
  Administered 2014-05-24 – 2014-05-27 (×16): 4 mg via INTRAVENOUS
  Filled 2014-05-23 (×12): qty 1

## 2014-05-23 MED ORDER — POTASSIUM CHLORIDE 20 MEQ/15ML (10%) PO LIQD
2.5000 meq | Freq: Two times a day (BID) | ORAL | Status: DC
  Administered 2014-05-24: 2.5333 meq via ORAL
  Filled 2014-05-23 (×3): qty 7.5

## 2014-05-23 MED ORDER — ONDANSETRON HCL 4 MG/2ML IJ SOLN
4.0000 mg | Freq: Four times a day (QID) | INTRAMUSCULAR | Status: DC | PRN
Start: 2014-05-23 — End: 2014-05-30

## 2014-05-23 MED ORDER — SENNA 8.6 MG PO TABS
1.0000 | ORAL_TABLET | Freq: Two times a day (BID) | ORAL | Status: DC
  Administered 2014-05-24 – 2014-05-30 (×13): 8.6 mg via ORAL
  Filled 2014-05-23 (×13): qty 1

## 2014-05-23 MED ORDER — BISACODYL 10 MG RE SUPP: 10.0000 mg | Freq: Every day | RECTAL | Status: DC | PRN

## 2014-05-23 MED ORDER — KETOROLAC TROMETHAMINE 30 MG/ML IJ SOLN
30.0000 mg | Freq: Once | INTRAMUSCULAR | Status: AC
  Administered 2014-05-23: 30 mg via INTRAVENOUS
  Filled 2014-05-23: qty 1

## 2014-05-23 MED ORDER — ALUM & MAG HYDROXIDE-SIMETH 200-200-20 MG/5ML PO SUSP: 30.0000 mL | Freq: Four times a day (QID) | ORAL | Status: DC | PRN

## 2014-05-23 MED ORDER — LISINOPRIL 20 MG PO TABS
20.0000 mg | ORAL_TABLET | Freq: Every day | ORAL | Status: DC
  Administered 2014-05-24 – 2014-05-30 (×7): 20 mg via ORAL
  Filled 2014-05-23 (×7): qty 1

## 2014-05-23 NOTE — H&P (Signed)
Devin [name] is an 43 y.o. male.   Chief Complaint: Back pain and bilateral leg discomfort. HPI: Devin [name] is a 43 year old white male who last Wednesday was discharged home having had a second drainage of a wound hematoma. He initially did well for about 2 weeks after surgery and then had strained slightly and experienced increasing pain. He is taken to the operating room in early August by Dr. Dutch Quint and underwent drainage of a significant hematoma. The next day drain that was implanted come out and the patient gradually mobilized but after several days had increasing pain and evidence of a recurrent mass. Monday a week ago I took him back to the operating room and re\re drained a substantial hematoma that was demonstrated on the MRI it to be as large as his previous hematoma. At the time of drainage no specific bleeding point was found. The wound was carefully inspected. A deep surgical drain was placed. He produced little output for the first 2 days and on Wednesday of last week Dr. Wynetta Emery discharge the patient home. He did well until Saturday when he started experiencing increasing pain. An MRI was performed at [facility]. Unfortunately I do not have access to this study. The study suggests the presence of a wound abscess, the patient however does not have any signs of infection such as an increased white count or fever he does complain of substantial back and leg pain. He is admitted now for better pain control and further evaluation.  Past Medical History  Diagnosis Date  . Diabetes mellitus without complication     diet contolled  only  . Hypertension   . Anxiety     Past Surgical History  Procedure Laterality Date  . Back surgery      x2 surgeries  . Nasal dilation    . Rotator cuff repair    . Posterior lumbar fusion 4 level N/A 04/19/2014    Procedure: LUMBAR TWO-THREE,LUMBAR THREE-FOUR,LUMBAR FOUR-FIVE,LUMBAR FIVE-SACRAL-ONE POSTERIOR LUMBAR INTERBODY FUSION/ADD ON TO THORACIC  TEN-LUMBAT TWO FUSION;  Surgeon: Barnett Abu, MD;  Location: [facility] NEURO ORS;  Service: Neurosurgery;  Laterality: N/A;  . Wound exploration N/A 05/11/2014    Procedure: Lumbar WOUND EXPLORATION, evcuation of hematoma;  Surgeon: Temple Pacini, MD;  Location: [facility] NEURO ORS;  Service: Neurosurgery;  Laterality: N/A;  . Lumbar laminectomy/decompression microdiscectomy N/A 05/16/2014    Procedure: Repeat evacuation of lumbar hematoma;  Surgeon: Barnett Abu, MD;  Location: [facility] NEURO ORS;  Service: Neurosurgery;  Laterality: N/A;  Repeat evacuation of lumbar hematoma    No family history on file. Social History:  reports that he has been smoking Cigarettes.  He has a 20 pack-year smoking history. He does not have any smokeless tobacco history on file. He reports that he does not drink alcohol or use illicit drugs.  Allergies:  Allergies  Allergen Reactions  . Codeine Other (See Comments)    Skin "crawl"  . Hydrocodone Other (See Comments)    Skin "crawl"  . Other Nausea Only and Other (See Comments)    Darvocet     (Not in a hospital admission)  No results found for this or any previous visit (from the past 48 hour(s)). No results found.  Review of Systems  Constitutional: Negative.   HENT: Negative.   Eyes: Negative.   Respiratory: Negative.   Cardiovascular: Negative.   Gastrointestinal: Negative.   Genitourinary: Negative.   Musculoskeletal: Positive for back pain.  Skin: Negative.   Neurological:  Marked discomfort in lower extremities  Endo/Heme/Allergies: Negative.   Psychiatric/Behavioral: Negative.     Blood pressure 160/74, temperature 98.4 F (36.9 C), temperature source Oral, resp. rate 23, SpO2 99.00%. Physical Exam  Constitutional: He appears well-developed and well-nourished.  HENT:  Head: Normocephalic and atraumatic.  Eyes: Conjunctivae are normal. Pupils are equal, round, and reactive to light.  Neck: Normal range of motion.  Cardiovascular: Normal rate  and normal heart sounds.   Respiratory: Effort normal and breath sounds normal.  GI: Soft. Bowel sounds are normal.  Musculoskeletal:  Well-healed and clean midline incision with fluctuance of fluid in the subcutaneous space. No significant pressure on the wound.  Neurological:  Motor function appears to be intact in major groups in lower extremities including the iliopsoas the quadriceps tibialis anterior and gastrocs. Sensation appears intact to light touch and pin. Straight leg raising is positive at 30 in either lower extremity  Skin: Skin is warm.  Psychiatric: He has a normal mood and affect. His behavior is normal. Judgment and thought content normal.     Assessment/Plan We'll see if we can procure the actual images of his MRI.  In light of the fact that he does not have any active signs of infection such as erythema increased white count or fever I decided to start him on some steroid medication to see if this helps calm the degree and severity of pain that he is experiencing. The patient is taking a considerable dose of narcotic analgesic this will be continued for the meantime.  Micky Overturf J 05/23/2014, 9:32 PM

## 2014-05-23 NOTE — ED Notes (Signed)
Spoke with Dr. Nelli Swalley Dess, states he will be by to see pt in 45 minutes, verbal order given for 30 mg of toradol.

## 2014-05-23 NOTE — ED Notes (Signed)
Dr. Elsner at bedside. 

## 2014-05-23 NOTE — ED Notes (Signed)
Pt brought via [facility] from [facility] for spinal abscess r/t back surgery he rcvd in July .  Pt has had a total of 3 mg dilaudid by [facility] and 8 mg morphine by [place] and remains 10/10 pain.  Pt afebrile and was not given antibiotics.

## 2014-05-23 NOTE — ED Provider Notes (Signed)
Medical screening exam- patient was seen at [facility], transferred here for evaluation by his neurosurgeon. He, reportedly had an MRI of his lumbar spine that showed a hematoma. At 17:40, he is uncomfortable and wishes to have some pain medication. His neurosurgeon, has contacted the nurse, and ordered some pain medication. His initial vital signs, are reassuring. He has mild hypertension. The patient is sitting up, and appears fairly comfortable. He is not in respiratory distress.  His neurosurgeon plans on being here at about 1830.  Flint Melter, MD 05/25/14 [phone]

## 2014-05-24 ENCOUNTER — Other Ambulatory Visit (HOSPITAL_COMMUNITY): Payer: BC Managed Care – PPO

## 2014-05-24 MED ORDER — KETOROLAC TROMETHAMINE 30 MG/ML IJ SOLN
30.0000 mg | Freq: Four times a day (QID) | INTRAMUSCULAR | Status: DC | PRN
  Administered 2014-05-24 – 2014-05-27 (×10): 30 mg via INTRAVENOUS
  Filled 2014-05-24 (×11): qty 1

## 2014-05-24 MED ORDER — POTASSIUM CHLORIDE CRYS ER 10 MEQ PO TBCR: 5.0000 meq | EXTENDED_RELEASE_TABLET | Freq: Every day | ORAL | Status: DC

## 2014-05-24 MED ORDER — POTASSIUM CHLORIDE 20 MEQ/15ML (10%) PO LIQD
5.0000 meq | Freq: Every day | ORAL | Status: DC
  Administered 2014-05-24: 5.0667 meq via ORAL
  Filled 2014-05-24 (×2): qty 7.5

## 2014-05-24 MED ORDER — HYDROMORPHONE HCL PF 1 MG/ML IJ SOLN
1.0000 mg | INTRAMUSCULAR | Status: DC | PRN
  Administered 2014-05-24 – 2014-05-29 (×24): 1 mg via INTRAVENOUS
  Filled 2014-05-24 (×25): qty 1

## 2014-05-24 NOTE — Progress Notes (Signed)
Patient had large stool in the commode.  There was red blood in the commode and the stool was black in color.  [name] notified with no orders given.  Lance Bosch, RN

## 2014-05-24 NOTE — Progress Notes (Signed)
Patient ID: Devin [name], male   DOB: September 20, 1971, 43 y.o.   MRN: [id number] Little help from high-dose steroids. Patient still complains of significant bilateral leg pain States he's not getting adequate coverage despite high doses of narcotic analgesics the skin MRI cannot be recovered from, so hospital suggesting an fractures process Bowel obtain new MRI here today.

## 2014-05-25 ENCOUNTER — Inpatient Hospital Stay (HOSPITAL_COMMUNITY): Payer: BC Managed Care – PPO

## 2014-05-25 MED ORDER — POTASSIUM CHLORIDE 20 MEQ/15ML (10%) PO LIQD
5.0000 meq | Freq: Every day | ORAL | Status: DC
  Administered 2014-05-25 – 2014-05-30 (×6): 5.0667 meq via ORAL
  Filled 2014-05-25 (×9): qty 7.5

## 2014-05-25 NOTE — Progress Notes (Signed)
Patient voiced concerns about his care post discharge. Please follow up with patient.

## 2014-05-26 ENCOUNTER — Inpatient Hospital Stay (HOSPITAL_COMMUNITY): Payer: BC Managed Care – PPO

## 2014-05-26 MED ORDER — GADOBENATE DIMEGLUMINE 529 MG/ML IV SOLN
20.0000 mL | Freq: Once | INTRAVENOUS | Status: AC | PRN
  Administered 2014-05-26: 20 mL via INTRAVENOUS

## 2014-05-26 NOTE — Progress Notes (Signed)
Patient ID: Devin [name], male   DOB: 25-Jan-1971, 43 y.o.   MRN: [id number] Mri not yet done... Patient seems comfortable.. No fever.

## 2014-05-26 NOTE — Progress Notes (Signed)
Patient ID: Devin [name], male   DOB: 10-05-1970, 43 y.o.   MRN: [id number] I will signs stable. Patient walk today MRI still not done I've spoken with the MRI. They will call for him this evening.

## 2014-05-27 MED ORDER — KETOROLAC TROMETHAMINE 15 MG/ML IJ SOLN
15.0000 mg | Freq: Four times a day (QID) | INTRAMUSCULAR | Status: AC | PRN
  Administered 2014-05-28: 15 mg via INTRAVENOUS
  Filled 2014-05-27: qty 1

## 2014-05-27 MED ORDER — DEXAMETHASONE 2 MG PO TABS
2.0000 mg | ORAL_TABLET | Freq: Three times a day (TID) | ORAL | Status: DC
  Administered 2014-05-28 – 2014-05-30 (×8): 2 mg via ORAL
  Filled 2014-05-27 (×8): qty 1

## 2014-05-27 NOTE — Progress Notes (Signed)
Patient ID: Devin [name], male   DOB: [birth year], 43 y.o.   MRN: [id number] MRI has been reviewed. Study shows resolving hematoma, less epidural compression, overall improvement though fluid persists. Radiology comments on the fact that this may represent epidural abscess however patient has been afebrile during his entire hospital stay.  All his cultures have been negative and his last CBC showed a marginally high white count of 10.6.  To be sure I'll repeat a CBC but I doubt this represents infection. Steroids have helped with pain management to some degree. Discharge planning.

## 2014-05-27 NOTE — Care Management Note (Addendum)
  Page 1 of 1   05/27/2014     3:54:16 PM CARE MANAGEMENT NOTE 05/27/2014  Patient:  Devin [name], Devin [name]   Account Number:  [id number]  Date Initiated:  05/27/2014  Documentation initiated by:  Elmer Bales  Subjective/Objective Assessment:   Patient was admitted with post-operative hematoma. Lives at home alone.     Action/Plan:   Will follow for discharge needs pending PT/OT evals and physician orders.   Anticipated DC Date:     Anticipated DC Plan:  HOME/SELF CARE         Choice offered to / List presented to:             Status of service:  In process, will continue to follow Medicare Important Message given?   (If response is "NO", the following Medicare IM given date fields will be blank) Date Medicare IM given:   Medicare IM given by:   Date Additional Medicare IM given:   Additional Medicare IM given by:    Discharge Disposition:    Per UR Regulation:  Reviewed for med. necessity/level of care/duration of stay  If discussed at Long Length of Stay Meetings, dates discussed:    Comments:  05/27/14 1450 Elmer Bales RN, MSN, CM- Spoke with bedisde RN, who states that patient voiced concerns regarding discharging home alone, stating that he is unable to complete his ADLs independently.  At this time, no PT/OT was recommended for discharge.  CM provided patient with a private duty agency list.  CM will continue to follow if any additional discharge needs arise.

## 2014-05-28 NOTE — Progress Notes (Signed)
Patient ID: Devin [name], male   DOB: 1971-04-14, 43 y.o.   MRN: [id number] Subjective: Patient reports continued back pain. Legs ache when walking. Does like he has some difficulty bending the toes on his right leg  Objective: Vital signs in last 24 hours: Temp:  [97.7 F (36.5 C)-99.5 F (37.5 C)] 97.7 F (36.5 C) (08/29 0512) Pulse Rate:  [74-88] 84 (08/29 0512) Resp:  [18-20] 18 (08/29 0512) BP: (109-153)/(61-88) 141/83 mmHg (08/29 0512) SpO2:  [100 %] 100 % (08/29 0512)  Intake/Output from previous day: 08/28 0701 - 08/29 0700 In: 240 [P.O.:240] Out: -  Intake/Output this shift:    Neurologic: Grossly normal  Lab Results: Lab Results  Component Value Date   WBC 8.9 05/17/2014   HGB 9.5* 05/17/2014   HCT 29.1* 05/17/2014   MCV 101.0* 05/17/2014   PLT 348 05/17/2014   No results found for this basename: INR, PROTIME   BMET Lab Results  Component Value Date   NA 140 05/11/2014   K 3.7 05/11/2014   CL 103 05/11/2014   CO2 23 05/11/2014   GLUCOSE 72 05/11/2014   BUN 13 05/11/2014   CREATININE 0.74 05/11/2014   CALCIUM 8.6 05/11/2014    Studies/Results: Mr Lumbar Spine W Wo Contrast  05/26/2014   CLINICAL DATA:  Thoracolumbar fusion with hardware 04/21/2014. Repeat operation for hematoma evacuation 05/11/2014. Now with increased back pain .  EXAM: MRI LUMBAR SPINE WITHOUT AND WITH CONTRAST  TECHNIQUE: Multiplanar and multiecho pulse sequences of the lumbar spine were obtained without and with intravenous contrast.  CONTRAST:  20mL MULTIHANCE GADOBENATE DIMEGLUMINE 529 MG/ML IV SOLN  COMPARISON:  Lumbar MRI 05/15/2014  FINDINGS: Extensive lumbar hardware is causing extensive artifact with limited anatomic detail as noted on prior studies.  Chronic compression fractures T12 and L1 are unchanged. Pedicle screw fusion with hardware from T11 through S1. Decompressive laminectomy T12 through L1.  Complex fluid collection is present posterior to the dura extending from L2 through L5. This  is consistent with hematoma. This is smaller than that seen previously. The hematoma does indent the thecal sac and causes spinal stenosis most prominent at L3-4 and L4-5 however stenosis is not as severe as the prior MRI. Postcontrast imaging limited by a hardware. The fluid collection measures approximately 3 x 3 x 8 cm.  Subcutaneous fluid collection superficial to the fascia measures 2.5 x 8.3 cm on axial images and extends over 20 cm from T12 through the sacrum. This was present previously but appears improved. This could represent a chronic seroma or of CSF leak. The fluid does track down to the hardware bilaterally at the L1 level. This is simple fluid and could represent CSF.  It is possible the 2 fluid collections are connected however they could be separate. Infection cannot be excluded.  IMPRESSION: Complex fluid collection extending down to the dura from L2 through L4 consistent with hematoma. This is causing moderate spinal stenosis. This collection is smaller than that seen on the prior MRI.  Subcutaneous fluid collection from T12 through the sacrum is also smaller and could represent a CSF collection. This fluid does track around the hardware at L1 suggestive of CSF leak.  Infected fluid not excluded.   Electronically Signed   By: Marlan Palau M.D.   On: 05/26/2014 19:51    Assessment/Plan: Seems stable. Continue pain control   LOS: 5 days    JONES,DAVID S 05/28/2014, 8:36 AM

## 2014-05-28 NOTE — Progress Notes (Deleted)
Patient was fed dinner earlier and was found to be pocketing food. Also attempted to give meds in applesauce later in evening and would not swallow. Pt was suctioned.

## 2014-05-29 MED ORDER — OXYCODONE HCL ER 15 MG PO T12A
15.0000 mg | EXTENDED_RELEASE_TABLET | Freq: Two times a day (BID) | ORAL | Status: DC
  Administered 2014-05-29 – 2014-05-30 (×3): 15 mg via ORAL
  Filled 2014-05-29 (×3): qty 1

## 2014-05-29 NOTE — Progress Notes (Signed)
No issues overnight. Pt reports some back and leg pain despite current meds.   EXAM:  BP 141/84  Pulse 85  Temp(Src) 99.2 F (37.3 C) (Oral)  Resp 20  Ht  (1.727 m)  Wt 100.1 kg (220 lb 10.9 oz)  BMI 33.56 kg/m2  SpO2 100%  Awake, alert, oriented  MAE well  IMPRESSION:  43 y.o. male s/p repeat lumbar hematoma evacuation with continued pain  PLAN: - will start Oxy ER with Oxy IR for breakthrough. Will d/c IV meds - Pt states he would like to try to go home tomorrow.

## 2014-05-30 MED ORDER — OXYCODONE HCL ER 15 MG PO T12A: 15.0000 mg | EXTENDED_RELEASE_TABLET | Freq: Two times a day (BID) | ORAL | Status: DC

## 2014-05-30 MED ORDER — DEXAMETHASONE 1 MG PO TABS: ORAL_TABLET | ORAL | Status: DC

## 2014-05-30 MED ORDER — OXYCODONE HCL 5 MG PO TABS: 5.0000 mg | ORAL_TABLET | ORAL | Status: DC | PRN

## 2014-05-30 NOTE — Progress Notes (Signed)
Discharge orders received. Pt for discharge home today. IV d/c'd. Pt given discharge instructions and prescriptions with verbalized understanding. Family in room to assist with discharge. Staff brought pt downstairs via wheelchair.   

## 2014-05-30 NOTE — Discharge Summary (Signed)
Physician Discharge Summary  Patient ID: Devin [name] MRN: [id number] DOB/AGE: 1971/01/04 43 y.o.  Admit date: 05/23/2014 Discharge date: 05/30/2014  Admission Diagnoses: Postoperative fluid, abscess  Discharge Diagnoses: Postoperative fluid collection, or signs of infection. Lumbar radicular pain. Active Problems:   Fluid collection at surgical site   Discharged Condition: good  Hospital Course: Patient was admitted on transfer from the emergency room in [place]. He had been in the hospital the week previous and he had surgical drainage of a postoperative wound hematoma that appeared to reaccumulate within a week's time. The patient had been improved but after several days at home he felt pain returning and fluid collection returning. In the emergency department at [facility] an MRI was performed suggesting that the fluid collections possibly infected this despite the fact that the patient did not seem to have a fever.  After being transferred here it was evident that the patient did not appear to have any wound infection. His white count was minimally elevated at 10.5. Followup studies revealed no evidence of reaccumulation and in fact a followup MRI a few days later demonstrated that the fluid collection was reabsorbing. Pain management has been an issue with the patient is currently managed with 15 mg of toxic Contin twice daily. Beyond this uses OxyIR 5 mg for breakthrough pain. He had been started on some high dose Decadron initially and this improved his pain. He is now being tapered to 1 mg twice a day and ultimately will be removed from this medication and next 2 weeks time. Followup visit will be on an outpatient basis in approximately 2 weeks.  Consults: None  Significant Diagnostic Studies: MRI lumbar spine with gadolinium  Treatments: Observation and pain management  Discharge Exam: Blood pressure 132/79, pulse 82, temperature 98.1 F (36.7 C), temperature source  Oral, resp. rate 20, height  (1.727 m), weight 100.1 kg (220 lb 10.9 oz), SpO2 100.00%. His incision is clean and dry. There is a modest amount of fluctuance of the lumbar wound incision area. This is nontender to palpation. motor strength in lower extremities is intact bilateral  Disposition: 01-Home or Self Care  Discharge Instructions   Call MD for:  redness, tenderness, or signs of infection (pain, swelling, redness, odor or green/yellow discharge around incision site)    Complete by:  As directed      Call MD for:  severe uncontrolled pain    Complete by:  As directed      Call MD for:  temperature >100.4    Complete by:  As directed      Diet - low sodium heart healthy    Complete by:  As directed      Discharge instructions    Complete by:  As directed   Ambulate as much as tolerated     Increase activity slowly    Complete by:  As directed             Medication List         acetaminophen 500 MG tablet  Commonly known as:  TYLENOL  Take 2,000 mg by mouth every 6 (six) hours as needed for moderate pain or headache.     dexamethasone 1 MG tablet  Commonly known as:  DECADRON  1 twice a day for 5 days, 1 daily for 5 days, 1 every other day until finished     lisinopril 20 MG tablet  Commonly known as:  PRINIVIL,ZESTRIL  Take 1 tablet (20 mg total) by  mouth daily.     naproxen 500 MG tablet  Commonly known as:  NAPROSYN  Take 500 mg by mouth 2 (two) times daily as needed for moderate pain.     oxyCODONE 5 MG immediate release tablet  Commonly known as:  Oxy IR/ROXICODONE  Take 1 tablet (5 mg total) by mouth every 4 (four) hours as needed for moderate pain.     OxyCODONE 15 mg T12a 12 hr tablet  Commonly known as:  OXYCONTIN  Take 1 tablet (15 mg total) by mouth every 12 (twelve) hours.     PARoxetine 40 MG tablet  Commonly known as:  PAXIL  Take 1 tablet (40 mg total) by mouth every morning.     Potassium 99 MG Tabs  Take 99 mg by mouth 2 (two) times  daily.         SignedStefani Dama 05/30/2014, 1:12 PM

## 2014-05-30 NOTE — Progress Notes (Signed)
Pt has discharge instructions and prescriptions. IV d/c'd. Pt is trying to reach the person who will take him home but has been unable to reach him up to this point. Pt instructed to let RN know when he contacts his ride.

## 2014-06-10 ENCOUNTER — Encounter (HOSPITAL_COMMUNITY): Payer: Self-pay | Admitting: Emergency Medicine

## 2014-06-10 ENCOUNTER — Encounter (HOSPITAL_COMMUNITY): Payer: BC Managed Care – PPO | Admitting: Anesthesiology

## 2014-06-10 ENCOUNTER — Inpatient Hospital Stay (HOSPITAL_COMMUNITY): Payer: BC Managed Care – PPO

## 2014-06-10 ENCOUNTER — Inpatient Hospital Stay (HOSPITAL_COMMUNITY)
Admission: EM | Admit: 2014-06-10 | Discharge: 2014-07-02 | DRG: 326 | Disposition: A | Discharge status: Home Health | Payer: BC Managed Care – PPO | Attending: Internal Medicine | Admitting: Internal Medicine

## 2014-06-10 ENCOUNTER — Emergency Department (HOSPITAL_COMMUNITY): Payer: BC Managed Care – PPO | Admitting: Anesthesiology

## 2014-06-10 ENCOUNTER — Encounter (HOSPITAL_COMMUNITY)
Admission: EM | Disposition: A | Discharge status: Home Health | Payer: BC Managed Care – PPO | Source: Home / Self Care | Attending: Neurological Surgery

## 2014-06-10 ENCOUNTER — Emergency Department (HOSPITAL_COMMUNITY): Payer: BC Managed Care – PPO

## 2014-06-10 DIAGNOSIS — F329 Major depressive disorder, single episode, unspecified: Secondary | ICD-10-CM | POA: Diagnosis present

## 2014-06-10 DIAGNOSIS — T148XXA Other injury of unspecified body region, initial encounter: Secondary | ICD-10-CM | POA: Diagnosis not present

## 2014-06-10 DIAGNOSIS — M4626 Osteomyelitis of vertebra, lumbar region: Secondary | ICD-10-CM | POA: Diagnosis present

## 2014-06-10 DIAGNOSIS — H109 Unspecified conjunctivitis: Secondary | ICD-10-CM | POA: Diagnosis not present

## 2014-06-10 DIAGNOSIS — E8770 Fluid overload, unspecified: Secondary | ICD-10-CM | POA: Diagnosis not present

## 2014-06-10 DIAGNOSIS — M869 Osteomyelitis, unspecified: Secondary | ICD-10-CM | POA: Diagnosis not present

## 2014-06-10 DIAGNOSIS — K650 Generalized (acute) peritonitis: Secondary | ICD-10-CM | POA: Diagnosis not present

## 2014-06-10 DIAGNOSIS — E871 Hypo-osmolality and hyponatremia: Secondary | ICD-10-CM | POA: Diagnosis present

## 2014-06-10 DIAGNOSIS — K255 Chronic or unspecified gastric ulcer with perforation: Secondary | ICD-10-CM | POA: Diagnosis present

## 2014-06-10 DIAGNOSIS — L7622 Postprocedural hemorrhage and hematoma of skin and subcutaneous tissue following other procedure: Secondary | ICD-10-CM | POA: Diagnosis present

## 2014-06-10 DIAGNOSIS — J9600 Acute respiratory failure, unspecified whether with hypoxia or hypercapnia: Secondary | ICD-10-CM | POA: Diagnosis not present

## 2014-06-10 DIAGNOSIS — T814XXA Infection following a procedure, initial encounter: Secondary | ICD-10-CM | POA: Diagnosis present

## 2014-06-10 DIAGNOSIS — T888XXD Other specified complications of surgical and medical care, not elsewhere classified, subsequent encounter: Secondary | ICD-10-CM

## 2014-06-10 DIAGNOSIS — G629 Polyneuropathy, unspecified: Secondary | ICD-10-CM | POA: Diagnosis not present

## 2014-06-10 DIAGNOSIS — N179 Acute kidney failure, unspecified: Secondary | ICD-10-CM | POA: Diagnosis not present

## 2014-06-10 DIAGNOSIS — R109 Unspecified abdominal pain: Secondary | ICD-10-CM | POA: Diagnosis present

## 2014-06-10 DIAGNOSIS — F419 Anxiety disorder, unspecified: Secondary | ICD-10-CM | POA: Diagnosis present

## 2014-06-10 DIAGNOSIS — Z6832 Body mass index (BMI) 32.0-32.9, adult: Secondary | ICD-10-CM

## 2014-06-10 DIAGNOSIS — M47817 Spondylosis without myelopathy or radiculopathy, lumbosacral region: Secondary | ICD-10-CM

## 2014-06-10 DIAGNOSIS — Y838 Other surgical procedures as the cause of abnormal reaction of the patient, or of later complication, without mention of misadventure at the time of the procedure: Secondary | ICD-10-CM | POA: Diagnosis present

## 2014-06-10 DIAGNOSIS — A4902 Methicillin resistant Staphylococcus aureus infection, unspecified site: Secondary | ICD-10-CM | POA: Diagnosis present

## 2014-06-10 DIAGNOSIS — T84226A Displacement of internal fixation device of vertebrae, initial encounter: Secondary | ICD-10-CM | POA: Diagnosis present

## 2014-06-10 DIAGNOSIS — Z654 Victim of crime and terrorism: Secondary | ICD-10-CM | POA: Diagnosis not present

## 2014-06-10 DIAGNOSIS — IMO0001 Reserved for inherently not codable concepts without codable children: Secondary | ICD-10-CM

## 2014-06-10 DIAGNOSIS — D62 Acute posthemorrhagic anemia: Secondary | ICD-10-CM | POA: Diagnosis not present

## 2014-06-10 DIAGNOSIS — E8809 Other disorders of plasma-protein metabolism, not elsewhere classified: Secondary | ICD-10-CM | POA: Diagnosis not present

## 2014-06-10 DIAGNOSIS — I10 Essential (primary) hypertension: Secondary | ICD-10-CM | POA: Diagnosis present

## 2014-06-10 DIAGNOSIS — E876 Hypokalemia: Secondary | ICD-10-CM | POA: Diagnosis not present

## 2014-06-10 DIAGNOSIS — E1140 Type 2 diabetes mellitus with diabetic neuropathy, unspecified: Secondary | ICD-10-CM

## 2014-06-10 DIAGNOSIS — D649 Anemia, unspecified: Secondary | ICD-10-CM | POA: Diagnosis not present

## 2014-06-10 DIAGNOSIS — E119 Type 2 diabetes mellitus without complications: Secondary | ICD-10-CM

## 2014-06-10 DIAGNOSIS — T8140XA Infection following a procedure, unspecified, initial encounter: Secondary | ICD-10-CM | POA: Diagnosis not present

## 2014-06-10 DIAGNOSIS — T888XXS Other specified complications of surgical and medical care, not elsewhere classified, sequela: Secondary | ICD-10-CM

## 2014-06-10 DIAGNOSIS — F17210 Nicotine dependence, cigarettes, uncomplicated: Secondary | ICD-10-CM | POA: Diagnosis present

## 2014-06-10 DIAGNOSIS — K251 Acute gastric ulcer with perforation: Secondary | ICD-10-CM

## 2014-06-10 DIAGNOSIS — R52 Pain, unspecified: Secondary | ICD-10-CM

## 2014-06-10 HISTORY — PX: LAPAROTOMY: SHX154

## 2014-06-10 HISTORY — PX: REPAIR OF PERFORATED ULCER: SHX6065

## 2014-06-10 LAB — MRSA PCR SCREENING: MRSA BY PCR: POSITIVE — AB

## 2014-06-10 LAB — GLUCOSE, CAPILLARY
Glucose-Capillary: 138 mg/dL — ABNORMAL HIGH (ref 70–99)
Glucose-Capillary: 134 mg/dL — ABNORMAL HIGH (ref 70–99)
Glucose-Capillary: 134 mg/dL — ABNORMAL HIGH (ref 70–99)
Glucose-Capillary: 119 mg/dL — ABNORMAL HIGH (ref 70–99)

## 2014-06-10 LAB — BASIC METABOLIC PANEL
ANION GAP: 12 (ref 5–15)
BUN: 22 mg/dL (ref 6–23)
CALCIUM: 7.5 mg/dL — AB (ref 8.4–10.5)
CO2: 20 mEq/L (ref 19–32)
Chloride: 102 mEq/L (ref 96–112)
Creatinine, Ser: 1.16 mg/dL (ref 0.50–1.35)
GFR calc Af Amer: 86 mL/min — ABNORMAL LOW (ref >90)
GFR, EST NON AFRICAN AMERICAN: 75 mL/min — AB (ref >90)
Glucose, Bld: 148 mg/dL — ABNORMAL HIGH (ref 70–99)
Potassium: 4.4 mEq/L (ref 3.7–5.3)
SODIUM: 134 meq/L — AB (ref 137–147)

## 2014-06-10 LAB — URINALYSIS, ROUTINE W REFLEX MICROSCOPIC
BILIRUBIN URINE: NEGATIVE
Glucose, UA: NEGATIVE mg/dL
KETONES UR: NEGATIVE mg/dL
Leukocytes, UA: NEGATIVE
Nitrite: NEGATIVE
Protein, ur: 100 mg/dL — AB
Specific Gravity, Urine: 1.02 (ref 1.005–1.030)
UROBILINOGEN UA: 0.2 mg/dL (ref 0.0–1.0)
pH: 5.5 (ref 5.0–8.0)

## 2014-06-10 LAB — CBC WITH DIFFERENTIAL/PLATELET
BASOS ABS: 0 10*3/uL (ref 0.0–0.1)
Basophils Relative: 0 % (ref 0–1)
EOS ABS: 0 10*3/uL (ref 0.0–0.7)
EOS PCT: 0 % (ref 0–5)
HCT: 28.1 % — ABNORMAL LOW (ref 39.0–52.0)
Hemoglobin: 8.7 g/dL — ABNORMAL LOW (ref 13.0–17.0)
LYMPHS PCT: 8 % — AB (ref 12–46)
Lymphs Abs: 0.7 10*3/uL (ref 0.7–4.0)
MCH: 30.2 pg (ref 26.0–34.0)
MCHC: 31 g/dL (ref 30.0–36.0)
MCV: 97.6 fL (ref 78.0–100.0)
Monocytes Absolute: 0.3 10*3/uL (ref 0.1–1.0)
Monocytes Relative: 4 % (ref 3–12)
Neutro Abs: 7.8 10*3/uL — ABNORMAL HIGH (ref 1.7–7.7)
Neutrophils Relative %: 88 % — ABNORMAL HIGH (ref 43–77)
Platelets: 383 10*3/uL (ref 150–400)
RBC: 2.88 MIL/uL — ABNORMAL LOW (ref 4.22–5.81)
RDW: 17.1 % — AB (ref 11.5–15.5)
WBC: 8.9 10*3/uL (ref 4.0–10.5)

## 2014-06-10 LAB — URINE MICROSCOPIC-ADD ON

## 2014-06-10 LAB — POCT I-STAT 3, ART BLOOD GAS (G3+)
ACID-BASE DEFICIT: 5 mmol/L — AB (ref 0.0–2.0)
Bicarbonate: 20.6 mEq/L (ref 20.0–24.0)
O2 Saturation: 100 %
PO2 ART: 228 mmHg — AB (ref 80.0–100.0)
Patient temperature: 98.8
TCO2: 22 mmol/L (ref 0–100)
pCO2 arterial: 37.3 mmHg (ref 35.0–45.0)
pH, Arterial: 7.35 (ref 7.350–7.450)

## 2014-06-10 LAB — PREPARE RBC (CROSSMATCH)

## 2014-06-10 SURGERY — LAPAROTOMY, EXPLORATORY
Anesthesia: General | Site: Abdomen

## 2014-06-10 MED ORDER — HYDROMORPHONE HCL PF 1 MG/ML IJ SOLN
1.0000 mg | Freq: Once | INTRAMUSCULAR | Status: AC
  Administered 2014-06-10: 1 mg via INTRAVENOUS
  Filled 2014-06-10: qty 1

## 2014-06-10 MED ORDER — PNEUMOCOCCAL VAC POLYVALENT 25 MCG/0.5ML IJ INJ
0.5000 mL | INJECTION | INTRAMUSCULAR | Status: AC
  Administered 2014-06-11: 0.5 mL via INTRAMUSCULAR
  Filled 2014-06-10: qty 0.5

## 2014-06-10 MED ORDER — IOHEXOL 300 MG/ML  SOLN
100.0000 mL | Freq: Once | INTRAMUSCULAR | Status: AC | PRN
  Administered 2014-06-10: 100 mL via INTRAVENOUS

## 2014-06-10 MED ORDER — DEXTROSE IN LACTATED RINGERS 5 % IV SOLN
INTRAVENOUS | Status: DC
  Administered 2014-06-10 – 2014-06-11 (×5): via INTRAVENOUS

## 2014-06-10 MED ORDER — HYDROMORPHONE HCL PF 1 MG/ML IJ SOLN: 1.0000 mg | Freq: Once | INTRAMUSCULAR | Status: DC

## 2014-06-10 MED ORDER — CHLORHEXIDINE GLUCONATE 0.12 % MT SOLN
15.0000 mL | Freq: Two times a day (BID) | OROMUCOSAL | Status: DC
  Administered 2014-06-10 – 2014-06-23 (×25): 15 mL via OROMUCOSAL
  Filled 2014-06-10 (×22): qty 15

## 2014-06-10 MED ORDER — ETOMIDATE 2 MG/ML IV SOLN
INTRAVENOUS | Status: DC | PRN
  Administered 2014-06-10: 20 mg via INTRAVENOUS

## 2014-06-10 MED ORDER — LIDOCAINE HCL (CARDIAC) 20 MG/ML IV SOLN
INTRAVENOUS | Status: AC
  Filled 2014-06-10: qty 5

## 2014-06-10 MED ORDER — HYDROMORPHONE HCL PF 1 MG/ML IJ SOLN
2.0000 mg | Freq: Once | INTRAMUSCULAR | Status: AC
  Administered 2014-06-10: 2 mg via INTRAVENOUS
  Filled 2014-06-10: qty 2

## 2014-06-10 MED ORDER — STERILE WATER FOR INJECTION IJ SOLN
INTRAMUSCULAR | Status: AC
  Filled 2014-06-10: qty 20

## 2014-06-10 MED ORDER — ARTIFICIAL TEARS OP OINT
TOPICAL_OINTMENT | OPHTHALMIC | Status: AC
  Filled 2014-06-10: qty 3.5

## 2014-06-10 MED ORDER — MIDAZOLAM HCL 5 MG/5ML IJ SOLN
INTRAMUSCULAR | Status: DC | PRN
  Administered 2014-06-10: 2 mg via INTRAVENOUS

## 2014-06-10 MED ORDER — DIPHENHYDRAMINE HCL 12.5 MG/5ML PO ELIX
12.5000 mg | ORAL_SOLUTION | Freq: Four times a day (QID) | ORAL | Status: DC | PRN
  Filled 2014-06-10: qty 5

## 2014-06-10 MED ORDER — PIPERACILLIN-TAZOBACTAM 3.375 G IVPB
3.3750 g | Freq: Once | INTRAVENOUS | Status: AC
Start: 2014-06-10 — End: 2014-06-10
  Administered 2014-06-10: 3.375 g via INTRAVENOUS
  Filled 2014-06-10: qty 50

## 2014-06-10 MED ORDER — ONDANSETRON HCL 4 MG/2ML IJ SOLN
4.0000 mg | Freq: Four times a day (QID) | INTRAMUSCULAR | Status: DC | PRN
  Administered 2014-06-12 – 2014-06-16 (×7): 4 mg via INTRAVENOUS
  Filled 2014-06-10 (×7): qty 2

## 2014-06-10 MED ORDER — LACTATED RINGERS IV SOLN
INTRAVENOUS | Status: DC | PRN
  Administered 2014-06-10 (×2): via INTRAVENOUS

## 2014-06-10 MED ORDER — CHLORHEXIDINE GLUCONATE CLOTH 2 % EX PADS
6.0000 | MEDICATED_PAD | Freq: Every day | CUTANEOUS | Status: AC
Start: 2014-06-11 — End: 2014-06-15
  Administered 2014-06-11 – 2014-06-15 (×5): 6 via TOPICAL

## 2014-06-10 MED ORDER — PANTOPRAZOLE SODIUM 40 MG IV SOLR
40.0000 mg | Freq: Two times a day (BID) | INTRAVENOUS | Status: DC
  Administered 2014-06-10 – 2014-06-19 (×18): 40 mg via INTRAVENOUS
  Filled 2014-06-10 (×24): qty 40

## 2014-06-10 MED ORDER — SODIUM CHLORIDE 0.9 % IV SOLN
0.0000 ug/h | INTRAVENOUS | Status: DC
  Administered 2014-06-10: 300 ug/h via INTRAVENOUS
  Administered 2014-06-11: 200 ug/h via INTRAVENOUS
  Filled 2014-06-10 (×3): qty 50

## 2014-06-10 MED ORDER — SUCCINYLCHOLINE CHLORIDE 20 MG/ML IJ SOLN
INTRAMUSCULAR | Status: DC | PRN
  Administered 2014-06-10: 120 mg via INTRAVENOUS

## 2014-06-10 MED ORDER — ONDANSETRON HCL 4 MG PO TABS
4.0000 mg | ORAL_TABLET | Freq: Four times a day (QID) | ORAL | Status: DC | PRN
  Administered 2014-06-19 – 2014-06-29 (×2): 4 mg via ORAL
  Filled 2014-06-10 (×2): qty 1

## 2014-06-10 MED ORDER — ONDANSETRON HCL 4 MG/2ML IJ SOLN: 4.0000 mg | Freq: Four times a day (QID) | INTRAMUSCULAR | Status: DC | PRN

## 2014-06-10 MED ORDER — VANCOMYCIN HCL 10 G IV SOLR
1250.0000 mg | Freq: Three times a day (TID) | INTRAVENOUS | Status: DC
  Administered 2014-06-10 – 2014-06-12 (×5): 1250 mg via INTRAVENOUS
  Filled 2014-06-10 (×7): qty 1250

## 2014-06-10 MED ORDER — HYDROMORPHONE HCL PF 1 MG/ML IJ SOLN
1.0000 mg | INTRAMUSCULAR | Status: DC | PRN
  Administered 2014-06-10 – 2014-06-12 (×7): 1 mg via INTRAVENOUS
  Administered 2014-06-12 (×2): 2 mg via INTRAVENOUS
  Administered 2014-06-12 (×5): 1 mg via INTRAVENOUS
  Administered 2014-06-13: 2 mg via INTRAVENOUS
  Administered 2014-06-13 (×6): 1 mg via INTRAVENOUS
  Administered 2014-06-13 (×3): 2 mg via INTRAVENOUS
  Administered 2014-06-14 – 2014-06-15 (×9): 1 mg via INTRAVENOUS
  Administered 2014-06-15 (×2): 2 mg via INTRAVENOUS
  Administered 2014-06-15: 1 mg via INTRAVENOUS
  Administered 2014-06-15 – 2014-06-17 (×16): 2 mg via INTRAVENOUS
  Administered 2014-06-17: 1 mg via INTRAVENOUS
  Administered 2014-06-17 (×2): 2 mg via INTRAVENOUS
  Filled 2014-06-10 (×2): qty 1
  Filled 2014-06-10 (×2): qty 2
  Filled 2014-06-10: qty 1
  Filled 2014-06-10 (×2): qty 2
  Filled 2014-06-10 (×2): qty 1
  Filled 2014-06-10 (×2): qty 2
  Filled 2014-06-10: qty 1
  Filled 2014-06-10 (×5): qty 2
  Filled 2014-06-10: qty 1
  Filled 2014-06-10 (×2): qty 2
  Filled 2014-06-10 (×5): qty 1
  Filled 2014-06-10 (×6): qty 2
  Filled 2014-06-10: qty 1
  Filled 2014-06-10: qty 2
  Filled 2014-06-10 (×9): qty 1
  Filled 2014-06-10 (×5): qty 2
  Filled 2014-06-10: qty 1
  Filled 2014-06-10: qty 2
  Filled 2014-06-10: qty 1
  Filled 2014-06-10: qty 2
  Filled 2014-06-10: qty 1
  Filled 2014-06-10: qty 2
  Filled 2014-06-10 (×2): qty 1
  Filled 2014-06-10 (×2): qty 2

## 2014-06-10 MED ORDER — NALOXONE HCL 0.4 MG/ML IJ SOLN: 0.4000 mg | INTRAMUSCULAR | Status: DC | PRN

## 2014-06-10 MED ORDER — SODIUM CHLORIDE 0.9 % IJ SOLN: 9.0000 mL | INTRAMUSCULAR | Status: DC | PRN

## 2014-06-10 MED ORDER — VECURONIUM BROMIDE 10 MG IV SOLR
INTRAVENOUS | Status: AC
  Filled 2014-06-10: qty 10

## 2014-06-10 MED ORDER — METOPROLOL TARTRATE 1 MG/ML IV SOLN
2.5000 mg | INTRAVENOUS | Status: DC | PRN
  Administered 2014-06-11 (×2): 5 mg via INTRAVENOUS
  Filled 2014-06-10 (×2): qty 5

## 2014-06-10 MED ORDER — DEXMEDETOMIDINE HCL IN NACL 200 MCG/50ML IV SOLN
INTRAVENOUS | Status: DC | PRN
  Administered 2014-06-10: 0.7 ug/kg/h via INTRAVENOUS

## 2014-06-10 MED ORDER — HYDRALAZINE HCL 20 MG/ML IJ SOLN: 10.0000 mg | INTRAMUSCULAR | Status: DC | PRN

## 2014-06-10 MED ORDER — LORAZEPAM 2 MG/ML IJ SOLN
0.5000 mg | INTRAMUSCULAR | Status: DC | PRN
  Administered 2014-06-11: 1 mg via INTRAVENOUS
  Administered 2014-06-14: 0.5 mg via INTRAVENOUS
  Administered 2014-06-18 – 2014-06-22 (×3): 1 mg via INTRAVENOUS
  Filled 2014-06-10 (×5): qty 1

## 2014-06-10 MED ORDER — MIDAZOLAM HCL 2 MG/2ML IJ SOLN
INTRAMUSCULAR | Status: AC
  Filled 2014-06-10: qty 2

## 2014-06-10 MED ORDER — PIPERACILLIN-TAZOBACTAM 3.375 G IVPB 30 MIN
3.3750 g | Freq: Once | INTRAVENOUS | Status: AC
  Administered 2014-06-10: 3.375 g via INTRAVENOUS
  Filled 2014-06-10: qty 50

## 2014-06-10 MED ORDER — SUCCINYLCHOLINE CHLORIDE 20 MG/ML IJ SOLN
INTRAMUSCULAR | Status: AC
  Filled 2014-06-10: qty 1

## 2014-06-10 MED ORDER — FENTANYL CITRATE 0.05 MG/ML IJ SOLN
25.0000 ug/h | INTRAMUSCULAR | Status: DC
  Administered 2014-06-10: 100 ug/h via INTRAVENOUS
  Filled 2014-06-10: qty 50

## 2014-06-10 MED ORDER — 0.9 % SODIUM CHLORIDE (POUR BTL) OPTIME
TOPICAL | Status: DC | PRN
  Administered 2014-06-10 (×5): 1000 mL

## 2014-06-10 MED ORDER — PHENYLEPHRINE 40 MCG/ML (10ML) SYRINGE FOR IV PUSH (FOR BLOOD PRESSURE SUPPORT)
PREFILLED_SYRINGE | INTRAVENOUS | Status: AC
  Filled 2014-06-10: qty 10

## 2014-06-10 MED ORDER — INFLUENZA VAC SPLIT QUAD 0.5 ML IM SUSY
0.5000 mL | PREFILLED_SYRINGE | INTRAMUSCULAR | Status: AC
  Administered 2014-06-11: 0.5 mL via INTRAMUSCULAR
  Filled 2014-06-10: qty 0.5

## 2014-06-10 MED ORDER — DEXMEDETOMIDINE HCL IN NACL 200 MCG/50ML IV SOLN
0.4000 ug/kg/h | INTRAVENOUS | Status: DC
  Administered 2014-06-10: 1.2 ug/kg/h via INTRAVENOUS
  Administered 2014-06-11: 0.357 ug/kg/h via INTRAVENOUS
  Filled 2014-06-10 (×2): qty 50

## 2014-06-10 MED ORDER — EPHEDRINE SULFATE 50 MG/ML IJ SOLN
INTRAMUSCULAR | Status: AC
  Filled 2014-06-10: qty 1

## 2014-06-10 MED ORDER — FENTANYL CITRATE 0.05 MG/ML IJ SOLN
INTRAMUSCULAR | Status: AC
  Filled 2014-06-10: qty 5

## 2014-06-10 MED ORDER — HYDROMORPHONE 0.3 MG/ML IV SOLN: INTRAVENOUS | Status: DC

## 2014-06-10 MED ORDER — FENTANYL BOLUS VIA INFUSION
50.0000 ug | INTRAVENOUS | Status: DC | PRN
  Filled 2014-06-10: qty 100

## 2014-06-10 MED ORDER — VANCOMYCIN HCL 10 G IV SOLR
1750.0000 mg | Freq: Once | INTRAVENOUS | Status: AC
  Administered 2014-06-10: 1750 mg via INTRAVENOUS
  Filled 2014-06-10: qty 1750

## 2014-06-10 MED ORDER — SODIUM CHLORIDE 0.9 % IV BOLUS (SEPSIS)
1000.0000 mL | Freq: Once | INTRAVENOUS | Status: AC
  Administered 2014-06-10: 1000 mL via INTRAVENOUS

## 2014-06-10 MED ORDER — LIDOCAINE HCL (CARDIAC) 20 MG/ML IV SOLN
INTRAVENOUS | Status: DC | PRN
  Administered 2014-06-10: 100 mg via INTRAVENOUS

## 2014-06-10 MED ORDER — VECURONIUM BROMIDE 10 MG IV SOLR
INTRAVENOUS | Status: DC | PRN
  Administered 2014-06-10 (×2): 3 mg via INTRAVENOUS

## 2014-06-10 MED ORDER — VANCOMYCIN HCL IN DEXTROSE 1-5 GM/200ML-% IV SOLN
INTRAVENOUS | Status: AC
  Filled 2014-06-10: qty 200

## 2014-06-10 MED ORDER — PANTOPRAZOLE SODIUM 40 MG IV SOLR
40.0000 mg | Freq: Every day | INTRAVENOUS | Status: DC
  Administered 2014-06-10: 40 mg via INTRAVENOUS
  Filled 2014-06-10: qty 40

## 2014-06-10 MED ORDER — DIPHENHYDRAMINE HCL 50 MG/ML IJ SOLN
12.5000 mg | Freq: Four times a day (QID) | INTRAMUSCULAR | Status: DC | PRN
Start: 2014-06-10 — End: 2014-06-10

## 2014-06-10 MED ORDER — MUPIROCIN 2 % EX OINT
1.0000 "application " | TOPICAL_OINTMENT | Freq: Two times a day (BID) | CUTANEOUS | Status: AC
  Administered 2014-06-10 – 2014-06-14 (×10): 1 via NASAL
  Filled 2014-06-10 (×2): qty 22

## 2014-06-10 MED ORDER — CETYLPYRIDINIUM CHLORIDE 0.05 % MT LIQD
7.0000 mL | Freq: Four times a day (QID) | OROMUCOSAL | Status: DC
  Administered 2014-06-10 – 2014-06-28 (×45): 7 mL via OROMUCOSAL

## 2014-06-10 MED ORDER — DEXMEDETOMIDINE HCL IN NACL 200 MCG/50ML IV SOLN
INTRAVENOUS | Status: AC
  Filled 2014-06-10: qty 50

## 2014-06-10 MED ORDER — FENTANYL CITRATE 0.05 MG/ML IJ SOLN
INTRAMUSCULAR | Status: DC | PRN
Start: 2014-06-10 — End: 2014-06-10
  Administered 2014-06-10: 50 ug via INTRAVENOUS
  Administered 2014-06-10 (×2): 100 ug via INTRAVENOUS

## 2014-06-10 MED ORDER — LACTATED RINGERS IV SOLN
INTRAVENOUS | Status: DC | PRN
  Administered 2014-06-10 (×2): via INTRAVENOUS

## 2014-06-10 MED ORDER — VANCOMYCIN HCL 1000 MG IV SOLR
1000.0000 mg | INTRAVENOUS | Status: DC | PRN
  Administered 2014-06-10: 1000 mg via INTRAVENOUS

## 2014-06-10 MED ORDER — INSULIN ASPART 100 UNIT/ML ~~LOC~~ SOLN
0.0000 [IU] | SUBCUTANEOUS | Status: DC
  Administered 2014-06-10 – 2014-06-11 (×3): 1 [IU] via SUBCUTANEOUS
  Administered 2014-06-18: 2 [IU] via SUBCUTANEOUS
  Administered 2014-06-18 – 2014-06-21 (×3): 1 [IU] via SUBCUTANEOUS

## 2014-06-10 MED ORDER — PIPERACILLIN-TAZOBACTAM 3.375 G IVPB
3.3750 g | Freq: Three times a day (TID) | INTRAVENOUS | Status: DC
  Administered 2014-06-10 – 2014-06-20 (×28): 3.375 g via INTRAVENOUS
  Filled 2014-06-10 (×34): qty 50

## 2014-06-10 SURGICAL SUPPLY — 53 items
BLADE SURG ROTATE 9660 (MISCELLANEOUS) ×3 IMPLANT
BNDG GAUZE ELAST 4 BULKY (GAUZE/BANDAGES/DRESSINGS) ×3 IMPLANT
CANISTER SUCTION 2500CC (MISCELLANEOUS) ×9 IMPLANT
CHLORAPREP W/TINT 26ML (MISCELLANEOUS) ×3 IMPLANT
CONT SPEC 4OZ CLIKSEAL STRL BL (MISCELLANEOUS) ×6 IMPLANT
COVER MAYO STAND STRL (DRAPES) ×3 IMPLANT
COVER SURGICAL LIGHT HANDLE (MISCELLANEOUS) ×3 IMPLANT
DRAPE LAPAROSCOPIC ABDOMINAL (DRAPES) ×3 IMPLANT
DRAPE PROXIMA HALF (DRAPES) IMPLANT
DRAPE UTILITY 15X26 W/TAPE STR (DRAPE) ×6 IMPLANT
DRAPE WARM FLUID 44X44 (DRAPE) ×3 IMPLANT
DRSG OPSITE POSTOP 4X10 (GAUZE/BANDAGES/DRESSINGS) IMPLANT
DRSG OPSITE POSTOP 4X8 (GAUZE/BANDAGES/DRESSINGS) IMPLANT
ELECT BLADE 6.5 EXT (BLADE) ×3 IMPLANT
ELECT CAUTERY BLADE 6.4 (BLADE) ×3 IMPLANT
ELECT REM PT RETURN 9FT ADLT (ELECTROSURGICAL) ×3
ELECTRODE REM PT RTRN 9FT ADLT (ELECTROSURGICAL) ×1 IMPLANT
GAUZE SPONGE 4X4 12PLY STRL (GAUZE/BANDAGES/DRESSINGS) ×3 IMPLANT
GLOVE BIO SURGEON STRL SZ 6 (GLOVE) ×3 IMPLANT
GLOVE BIO SURGEON STRL SZ7.5 (GLOVE) ×3 IMPLANT
GLOVE BIOGEL PI IND STRL 6.5 (GLOVE) ×1 IMPLANT
GLOVE BIOGEL PI INDICATOR 6.5 (GLOVE) ×2
GLOVE SURG SS PI 7.5 STRL IVOR (GLOVE) ×3 IMPLANT
GOWN STRL REUS W/ TWL LRG LVL3 (GOWN DISPOSABLE) ×2 IMPLANT
GOWN STRL REUS W/TWL 2XL LVL3 (GOWN DISPOSABLE) ×3 IMPLANT
GOWN STRL REUS W/TWL LRG LVL3 (GOWN DISPOSABLE) ×4
KIT BASIN OR (CUSTOM PROCEDURE TRAY) ×3 IMPLANT
KIT ROOM TURNOVER OR (KITS) ×3 IMPLANT
LIGASURE IMPACT 36 18CM CVD LR (INSTRUMENTS) ×3 IMPLANT
NS IRRIG 1000ML POUR BTL (IV SOLUTION) ×15 IMPLANT
PACK GENERAL/GYN (CUSTOM PROCEDURE TRAY) ×3 IMPLANT
PAD ABD 8X10 STRL (GAUZE/BANDAGES/DRESSINGS) ×3 IMPLANT
PAD ARMBOARD 7.5X6 YLW CONV (MISCELLANEOUS) ×3 IMPLANT
PENCIL BUTTON HOLSTER BLD 10FT (ELECTRODE) IMPLANT
SPECIMEN JAR LARGE (MISCELLANEOUS) IMPLANT
SPONGE LAP 18X18 X RAY DECT (DISPOSABLE) ×6 IMPLANT
STAPLER VISISTAT 35W (STAPLE) ×3 IMPLANT
SUCTION POOLE TIP (SUCTIONS) ×3 IMPLANT
SUT PDS AB 1 TP1 96 (SUTURE) ×6 IMPLANT
SUT PDS II 0 TP-1 LOOPED 60 (SUTURE) IMPLANT
SUT SILK 2 0 SH CR/8 (SUTURE) ×3 IMPLANT
SUT SILK 3 0 SH CR/8 (SUTURE) ×3 IMPLANT
SUT VIC AB 2-0 SH 18 (SUTURE) ×6 IMPLANT
SUT VIC AB 3-0 SH 18 (SUTURE) ×6 IMPLANT
SUT VICRYL 4-0 PS2 18IN ABS (SUTURE) IMPLANT
SUT VICRYL AB 2 0 TIES (SUTURE) ×3 IMPLANT
SUT VICRYL AB 3 0 TIES (SUTURE) ×3 IMPLANT
TOWEL OR 17X24 6PK STRL BLUE (TOWEL DISPOSABLE) ×3 IMPLANT
TOWEL OR 17X26 10 PK STRL BLUE (TOWEL DISPOSABLE) ×3 IMPLANT
TRAY FOLEY CATH 16FRSI W/METER (SET/KITS/TRAYS/PACK) ×3 IMPLANT
TUBE CONNECTING 12'X1/4 (SUCTIONS)
TUBE CONNECTING 12X1/4 (SUCTIONS) IMPLANT
YANKAUER SUCT BULB TIP NO VENT (SUCTIONS) ×3 IMPLANT

## 2014-06-10 NOTE — Anesthesia Preprocedure Evaluation (Signed)
Anesthesia Evaluation  Patient identified by MRN, date of birth, ID band Patient awake    Reviewed: Allergy & Precautions, H&P , NPO status , Patient's Chart, lab work & pertinent test results  History of Anesthesia Complications Negative for: history of anesthetic complications  Airway Mallampati: III TM Distance: >3 FB Neck ROM: Full    Dental  (+) Teeth Intact, Dental Advisory Given   Pulmonary Current Smoker,  breath sounds clear to auscultation        Cardiovascular hypertension, Pt. on medications - anginaRhythm:Regular Rate:Normal     Neuro/Psych Chronic back pain: narcotics    GI/Hepatic negative GI ROS, Neg liver ROS,   Endo/Other  diabetesMorbid obesity  Renal/GU      Musculoskeletal   Abdominal (+) + obese,   Peds  Hematology  (+) Blood dyscrasia (Hb 11.6), ,   Anesthesia Other Findings   Reproductive/Obstetrics                           Anesthesia Physical Anesthesia Plan  ASA: III and emergent  Anesthesia Plan: General ETT   Post-op Pain Management:    Induction:   Airway Management Planned:   Additional Equipment:   Intra-op Plan:   Post-operative Plan:   Informed Consent: I have reviewed the patients History and Physical, chart, labs and discussed the procedure including the risks, benefits and alternatives for the proposed anesthesia with the patient or authorized representative who has indicated his/her understanding and acceptance.     Plan Discussed with:   Anesthesia Plan Comments:         Anesthesia Quick Evaluation

## 2014-06-10 NOTE — ED Notes (Signed)
Pt does not want to wear hospital gown. Pt states he is very hot.

## 2014-06-10 NOTE — Progress Notes (Signed)
ANTIBIOTIC CONSULT NOTE - INITIAL  Pharmacy Consult for zosyn, vancomycin Indication: peritonitis  Allergies  Allergen Reactions  . Codeine Other (See Comments)    Skin "crawl"  . Hydrocodone Other (See Comments)    Skin "crawl"  . Other Nausea Only and Other (See Comments)    Darvocet    Patient Measurements: Height:  (177.8 cm) IBW/kg (Calculated) : 73  Vital Signs: Temp: 98.8 F (37.1 C) (09/11 0847) Temp src: Oral (09/11 0847) BP: 151/99 mmHg (09/11 0330) Pulse Rate: 130 (09/11 0330) Intake/Output from previous day: 09/10 0701 - 09/11 0700 In: 50 [I.V.:50] Out: -  Intake/Output from this shift: Total I/O In: 2700 [I.V.:2700] Out: 100 [Blood:100]  Labs: No results found for this basename: WBC, HGB, PLT, LABCREA, CREATININE,  in the last 72 hours The CrCl is unknown because both a height and weight (above a minimum accepted value) are required for this calculation. No results found for this basename: VANCOTROUGH, VANCOPEAK, VANCORANDOM, GENTTROUGH, GENTPEAK, GENTRANDOM, TOBRATROUGH, TOBRAPEAK, TOBRARND, AMIKACINPEAK, AMIKACINTROU, AMIKACIN,  in the last 72 hours   Microbiology: Recent Results (from the past 720 hour(s))  WOUND CULTURE     Status: None   Collection Time    05/11/14 11:28 PM      Result Value Ref Range Status   Specimen Description WOUND BACK   Final   Special Requests LUMBAR WOUND   Final   Gram Stain     Final   Value: FEW WBC PRESENT,BOTH PMN AND MONONUCLEAR     NO SQUAMOUS EPITHELIAL CELLS SEEN     NO ORGANISMS SEEN     Performed at [facility]   Culture     Final   Value: NO GROWTH 2 DAYS     Performed at [facility]   Report Status 05/14/2014 FINAL   Final  CULTURE, ROUTINE-ABSCESS     Status: None   Collection Time    05/16/14 12:38 PM      Result Value Ref Range Status   Specimen Description ABSCESS BACK   Final   Special Requests NONE   Final   Gram Stain     Final   Value: RARE WBC PRESENT,BOTH PMN AND  MONONUCLEAR     NO SQUAMOUS EPITHELIAL CELLS SEEN     NO ORGANISMS SEEN     Performed at [facility]   Culture     Final   Value: NO GROWTH 3 DAYS     Performed at [facility]   Report Status 05/20/2014 FINAL   Final  ANAEROBIC CULTURE     Status: None   Collection Time    05/16/14 12:38 PM      Result Value Ref Range Status   Specimen Description ABSCESS BACK   Final   Special Requests NONE   Final   Gram Stain     Final   Value: RARE WBC PRESENT,BOTH PMN AND MONONUCLEAR     NO SQUAMOUS EPITHELIAL CELLS SEEN     NO ORGANISMS SEEN     Performed at [facility]   Culture     Final   Value: NO ANAEROBES ISOLATED     Performed at [facility]   Report Status 05/21/2014 FINAL   Final    Medical History: Past Medical History  Diagnosis Date  . Diabetes mellitus without complication     diet contolled  only  . Hypertension   . Anxiety     Medications:  Prescriptions prior to  admission  Medication Sig Dispense Refill  . acetaminophen (TYLENOL) 500 MG tablet Take 2,000 mg by mouth every 6 (six) hours as needed for moderate pain or headache.       . lisinopril (PRINIVIL,ZESTRIL) 20 MG tablet Take 1 tablet (20 mg total) by mouth daily.  30 tablet  0  . naproxen (NAPROSYN) 500 MG tablet Take 500 mg by mouth 2 (two) times daily as needed for moderate pain.       Marland Kitchen oxyCODONE (OXY IR/ROXICODONE) 5 MG immediate release tablet Take 1 tablet (5 mg total) by mouth every 4 (four) hours as needed for moderate pain.  60 tablet  0  . OxyCODONE (OXYCONTIN) 15 mg T12A 12 hr tablet Take 1 tablet (15 mg total) by mouth every 12 (twelve) hours.  60 tablet  0  . PARoxetine (PAXIL) 40 MG tablet Take 1 tablet (40 mg total) by mouth every morning.  30 tablet  0   Scheduled:  . HYDROmorphone PCA 0.3 mg/mL   Intravenous 6 times per day  . pantoprazole (PROTONIX) IV  40 mg Intravenous QHS     Assessment: 43 yo male here with perforated gastric ulcer s/p ex lap.   Patient also with concern for peritonitis to start vancomycin and zosyn.  No labs available yet (SCr was 0.7 on 05/11/14).  9/11 vanc>> 9/11 zosyn>>  9/11 urine  Goal of Therapy:  Vancomycin trough level 15-20 mcg/ml  Plan:  -Zosyn 3.375gm IV q8h -Vancomycin   then  IV q8h -Will follow renal function, cultures and clinical progress  Harland German, Pharm D 06/10/2014 9:19 AM

## 2014-06-10 NOTE — Progress Notes (Signed)
Rec'd pt already intubated from OR.  Pt placed on vent settings, 8 ml/kg until MD can evaluated pt and vent orders are rec'd.

## 2014-06-10 NOTE — Progress Notes (Signed)
Utilization Review Completed.Devin [name] T9/07/2014  

## 2014-06-10 NOTE — Anesthesia Procedure Notes (Signed)
Procedure Name: Intubation Date/Time: 06/10/2014 6:46 AM Performed by: Luster Landsberg Pre-anesthesia Checklist: Patient identified, Emergency Drugs available, Suction available and Patient being monitored Patient Re-evaluated:Patient Re-evaluated prior to inductionOxygen Delivery Method: Circle system utilized Preoxygenation: Pre-oxygenation with 100% oxygen Intubation Type: IV induction, Rapid sequence and Cricoid Pressure applied Grade View: Grade I Tube type: Subglottic suction tube Tube size: 7.5 mm Number of attempts: 2 Airway Equipment and Method: Stylet and Video-laryngoscopy Placement Confirmation: ETT inserted through vocal cords under direct vision,  positive ETCO2 and breath sounds checked- equal and bilateral Secured at: 23 cm Tube secured with: Tape Dental Injury: Teeth and Oropharynx as per pre-operative assessment  Comments: DVL with glidescope #8 ETT placed, cuff leak noted.  7.5 subglottic placed over bougie stylet.

## 2014-06-10 NOTE — H&P (Signed)
Devin [name] is an 43 y.o. male.   Chief Complaint: abdominal pain, back and leg pain HPI:  Pt is a 43 yo M who has been undergoing treatment by the neurosurgery team for back issues.  He underwent lumbar fusion earlier in the summer and had several evacuations of hematomas.  He went to the hospital in [place] and was seen to have another fluid collection in his surgical site.  He was transferred here to be admitted by them for treatment.   However, when the EDP evaluated him, he was found to be tachycardic and tender on his abdominal exam.  CT was performed, and this demonstrated free air and finding consistent with a perforated ulcer.  This goes along with his history as he has been taking narcotics, and many NSAIDs.  He has used ibuprofen, aspirin, and possibly some aleve in addition to his narcotics.    Past Medical History  Diagnosis Date  . Diabetes mellitus without complication     diet contolled  only  . Hypertension   . Anxiety     Past Surgical History  Procedure Laterality Date  . Back surgery      x2 surgeries  . Nasal dilation    . Rotator cuff repair    . Posterior lumbar fusion 4 level N/A 04/19/2014    Procedure: LUMBAR TWO-THREE,LUMBAR THREE-FOUR,LUMBAR FOUR-FIVE,LUMBAR FIVE-SACRAL-ONE POSTERIOR LUMBAR INTERBODY FUSION/ADD ON TO THORACIC TEN-LUMBAT TWO FUSION;  Surgeon: Barnett Abu, MD;  Location: [facility];  Service: Neurosurgery;  Laterality: N/A;  . Wound exploration N/A 05/11/2014    Procedure: Lumbar WOUND EXPLORATION, evcuation of hematoma;  Surgeon: Temple Pacini, MD;  Location: [facility]UR[facility] ORS;  Service: Neurosurgery;  Laterality: N/A;  . Lumbar laminectomy/decompression microdiscectomy N/A 05/16/2014    Procedure: Repeat evacuation of lumbar hematoma;  Surgeon: Barnett Abu, MD;  Location: [facility]URO ORS;  Service: Neurosurgery;  Laterality: N/A;  Repeat evacuation of lumbar hematoma    No family history on file. Social History:  reports that he has been smoking  Cigarettes.  He has a 20 pack-year smoking history. He does not have any smokeless tobacco history on file. He reports that he does not drink alcohol or use illicit drugs.  Allergies:  Allergies  Allergen Reactions  . Codeine Other (See Comments)    Skin "crawl"  . Hydrocodone Other (See Comments)    Skin "crawl"  . Other Nausea Only and Other (See Comments)    Darvocet     (Not in a hospital admission)  No results found for this or any previous visit (from the past 48 hour(s)). Ct Abdomen Pelvis W Contrast  06/10/2014   CLINICAL DATA:  Severe left lower quadrant pain.  EXAM: CT ABDOMEN AND PELVIS WITH CONTRAST  TECHNIQUE: Multidetector CT imaging of the abdomen and pelvis was performed using the standard protocol following bolus administration of intravenous contrast.  CONTRAST:  OMNIPAQUE IOHEXOL 300 MG/ML  SOLN  COMPARISON:  MRI of the lumbar spine May 26, 2014  FINDINGS: LUNG BASES: Included view of the lung bases demonstrate small layering left pleural effusion. Visualized heart and pericardium are unremarkable.  SOLID ORGANS: Layering density in the gallbladder suggests sludge. Liver, spleen, pancreas and adrenal glands are nonsuspicious .  GASTROINTESTINAL TRACT: Distal stomach/ pyloric wall thickening with superimposed perforated fusion posteriorly, with free air. The small and large bowel are normal in course and caliber without inflammatory changes. Colonic diverticulosis. Normal appendix.  KIDNEYS/ URINARY TRACT: Kidneys are orthotopic, demonstrating symmetric enhancement. No  nephrolithiasis, hydronephrosis or solid renal masses. The unopacified ureters are normal in course and caliber. Delayed imaging through the kidneys demonstrates symmetric prompt contrast excretion within the proximal urinary collecting system. Urinary bladder is partially distended and unremarkable.  PERITONEUM/RETROPERITONEUM: Large amount of pneumoperitoneum anterior within the upper abdomen. Small a  moderate amount of layering ascites without abscess. Aortoiliac vessels are normal in course and caliber, mild calcific atherosclerosis. No lymphadenopathy by CT size criteria. Internal reproductive organs are unremarkable.  SOFT TISSUE/OSSEOUS STRUCTURES: Status post T10 through S1 posterior instrumentation, at T12 chronic appearing burst fracture. L2 through L5 laminectomies with L2 through S1 interbody disc material, which does not appear incorporated. In addition, lucency about the S1 screws concerning for hardware failure possible infection. Peripherally calcified chronic appearing small lumbar paraspinal fluid collection without superimposed inflammatory changes.  IMPRESSION: Perforated gastric pyloric ulcer with moderate amount of pneumoperitoneum and small to moderate amount of ascites without abscess.  Colonic diverticulosis without convincing evidence of acute diverticulitis.  Extensive spinal surgery, with S1 screws periprosthetic lucency consistent with hardware failure.  Findings discussed with and reconfirmed by Dr.KEVIN CAMPOS on9/11/2015at2:45 am.   Electronically Signed   By: Awilda Metro   On: 06/10/2014 02:56    Review of Systems  Constitutional: Negative.   HENT: Negative.   Eyes: Negative.   Respiratory: Negative.   Cardiovascular: Negative.   Gastrointestinal: Positive for abdominal pain.  Musculoskeletal: Positive for back pain.       Leg pain  Skin: Negative.   Neurological:       Leg weakness  Endo/Heme/Allergies: Negative.   Psychiatric/Behavioral:       Has required a lot of narcotics, but it does not sound like these were to the abuse point.    Blood pressure 151/99, pulse 130, temperature 98.4 F (36.9 C), temperature source Oral, resp. rate 26, SpO2 96.00%. Physical Exam  Constitutional: He is oriented to person, place, and time. He appears well-developed and well-nourished. No distress.  HENT:  Head: Normocephalic and atraumatic.  Eyes: Conjunctivae are  normal. Pupils are equal, round, and reactive to light. Right eye exhibits no discharge. Left eye exhibits no discharge. No scleral icterus.  Neck: Normal range of motion. No tracheal deviation present. No thyromegaly present.  Cardiovascular: Regular rhythm.  Tachycardia present.   Respiratory: Effort normal. No respiratory distress.  GI: Soft. He exhibits no distension and no mass. There is tenderness (diffuse tenderness, worst in epigastric location). There is rebound and guarding.  Musculoskeletal: He exhibits no edema and no tenderness.  Neurological: He is alert and oriented to person, place, and time.  Skin: Skin is warm and dry. No rash noted. He is not diaphoretic. No erythema. There is pallor.  Psychiatric: He has a normal mood and affect. His behavior is normal. Judgment and thought content normal.     Assessment/Plan Perforated ulcer SIRS Fluid collection near fusion surgical site.   Plan ex lap for perforated ulcer Reviewed risks and benefits with patient.  Discussed possibility of leak, abscess, bleeding, possible finding of cancer, heart or lung complications.  Will likely need post op stepdown bed.   Neurosurgery will be consulted to evaluate fluid collection Rehydration Avoid NSAIDs IV PPI  Quynh Basso 06/10/2014, 5:47 AM

## 2014-06-10 NOTE — ED Notes (Signed)
Patients belongings were placed in patient belonging bins in POD C in the Emergency Room. Security would not lock up d/t belongings containing clothes.

## 2014-06-10 NOTE — ED Notes (Signed)
Patient had lumbar fusion in July. Patient went to [facility] and was diagnosed with abscess at surgical site. Patient transferred here to see Dr. Lovell Sheehan with NSG. BP 165/105.

## 2014-06-10 NOTE — Consult Note (Addendum)
PULMONARY / CRITICAL CARE MEDICINE   Name: Devin [name] MRN: [id number] DOB: 1971-09-10    ADMISSION DATE:  06/10/2014 CONSULTATION DATE: 06/10/14  REFERRING MD :  [name]  CHIEF COMPLAINT:  Peritonitis  INITIAL PRESENTATION: Pt is a 43yo male with a history of DM and HTN who underwent 2 lumbar surgeries followed by several evacuations of hematomas requiring repeat surgeries in July and Aug.  He recently admitted for fluid collection at the surgical site.  Upon evaluation, pt was tachycardic and c/o pain and tenderness in his abdomen.  CT showed free air consistent with a perforated gastric ulcer.  He was taken to OR for perforation repair. Remained intubated post op. Might require repeat surgery 9/12  STUDIES:  09/11  CT abdomen pelvis with contrast- perforated gastric pyloric ulcer with moderated amount of pneumoperitoneum and small to moderate amount of ascites without abscess, diverticulosis  SIGNIFICANT EVENTS: 07/21  Posterior lumbar fusion 08/12  Wound exploration 08/17  Lumbar laminectomy/decopression microdiscectomy 09/11  Admitted from [facility] 09/11  Exploratory laparotomy, repair of perforated pyloric channel ulcer  HISTORY OF PRESENT ILLNESS:   Pt is a 43yo male with a history of DM and HTN who underwent lumbar fusion in July 2015 and lumbar laminectomy in August 2015 followed by several evacuations of hematomas.  He recently had another fluid collection at the surgical site and was admitted here for treatment.  Upon evaluation, pt was tachycardic and c/o pain and tenderness in his abdomen.  CT was performed which showed free air consistent with a perforated gastric ulcer.  He has a history of narcotic and frequent NSAID use.  He was taken to OR for perforation repair and transferred to ICU for monitoring.  He remains intubated after surgery.  PAST MEDICAL HISTORY :  Past Medical History  Diagnosis Date  . Diabetes mellitus without complication     diet  contolled  only  . Hypertension   . Anxiety    Past Surgical History  Procedure Laterality Date  . Back surgery      x2 surgeries  . Nasal dilation    . Rotator cuff repair    . Posterior lumbar fusion 4 level N/A 04/19/2014    Procedure: LUMBAR TWO-THREE,LUMBAR THREE-FOUR,LUMBAR FOUR-FIVE,LUMBAR FIVE-SACRAL-ONE POSTERIOR LUMBAR INTERBODY FUSION/ADD ON TO THORACIC TEN-LUMBAT TWO FUSION;  Surgeon: Barnett Abu, MD;  Location: [facility] NEURO ORS;  Service: Neurosurgery;  Laterality: N/A;  . Wound exploration N/A 05/11/2014    Procedure: Lumbar WOUND EXPLORATION, evcuation of hematoma;  Surgeon: Temple Pacini, MD;  Location: [facility] NEURO ORS;  Service: Neurosurgery;  Laterality: N/A;  . Lumbar laminectomy/decompression microdiscectomy N/A 05/16/2014    Procedure: Repeat evacuation of lumbar hematoma;  Surgeon: Barnett Abu, MD;  Location: [facility]URO ORS;  Service: Neurosurgery;  Laterality: N/A;  Repeat evacuation of lumbar hematoma  . Repair of perforated ulcer  06/10/2014   Prior to Admission medications   Medication Sig Start Date End Date Taking? Authorizing Provider  acetaminophen (TYLENOL) 500 MG tablet Take 2,000 mg by mouth every 6 (six) hours as needed for moderate pain or headache.    Yes Historical Provider, MD  lisinopril (PRINIVIL,ZESTRIL) 20 MG tablet Take 1 tablet (20 mg total) by mouth daily. 04/23/14  Yes Lisbeth Renshaw, MD  naproxen (NAPROSYN) 500 MG tablet Take 500 mg by mouth 2 (two) times daily as needed for moderate pain.    Yes Historical Provider, MD  oxyCODONE (OXY IR/ROXICODONE) 5 MG immediate release tablet Take 1 tablet (5 mg  total) by mouth every 4 (four) hours as needed for moderate pain. 05/30/14  Yes Barnett Abu, MD  OxyCODONE (OXYCONTIN) 15 mg T12A 12 hr tablet Take 1 tablet (15 mg total) by mouth every 12 (twelve) hours. 05/30/14  Yes Barnett Abu, MD  PARoxetine (PAXIL) 40 MG tablet Take 1 tablet (40 mg total) by mouth every morning. 04/23/14  Yes Lisbeth Renshaw, MD    Allergies  Allergen Reactions  . Codeine Other (See Comments)    Skin "crawl"  . Hydrocodone Other (See Comments)    Skin "crawl"  . Other Nausea Only and Other (See Comments)    Darvocet    FAMILY HISTORY:  History reviewed. No pertinent family history. SOCIAL HISTORY:  reports that he has been smoking Cigarettes.  He has a 20 pack-year smoking history. He has quit using smokeless tobacco. He reports that he does not drink alcohol or use illicit drugs.  REVIEW OF SYSTEMS:  Pt intubated, unable to fully assess  SUBJECTIVE: Pt on vent, but does indicate that he has pain in his abdomen  VITAL SIGNS: Temp:  [98.4 F (36.9 C)-98.8 F (37.1 C)] 98.8 F (37.1 C) (09/11 0847) Pulse Rate:  [100-130] 124 (09/11 1000) Resp:  [13-26] 19 (09/11 1000) BP: (135-172)/(98-109) 135/99 mmHg (09/11 1000) SpO2:  [96 %-100 %] 100 % (09/11 1000) Arterial Line BP: (152-176)/(91-107) 153/91 mmHg (09/11 1000) FiO2 (%):  [60 %] 60 % (09/11 1000) Weight:  [217 lb 13 oz (98.8 kg)] 217 lb 13 oz (98.8 kg) (09/11 0900) HEMODYNAMICS:   VENTILATOR SETTINGS: Vent Mode:  [-] PRVC FiO2 (%):  [60 %] 60 % Set Rate:  [14 bmp] 14 bmp Vt Set:  [580 mL] 580 mL PEEP:  [5 cmH20] 5 cmH20 Plateau Pressure:  [16 cmH20] 16 cmH20 INTAKE / OUTPUT:  Intake/Output Summary (Last 24 hours) at 06/10/14 1140 Last data filed at 06/10/14 1000  Gross per 24 hour  Intake 2896.58 ml  Output    100 ml  Net 2796.58 ml    PHYSICAL EXAMINATION: General:  WDWN male in minimal discomfort on vent Neuro:  Drowsy, arousable, able to follow commands HEENT:  Intubated, NAD Cardiovascular:  Tachycardic, no m/r/g Lungs:  Coarse breath sounds B, even/non-labored breaths on vent Abdomen:  -BS, tender and distended, bandage over surgical incision Ext:  No edema, ROM nl, warm Skin:  Warm, intact except for abdominal wound  LABS:  CBC  Recent Labs Lab 06/10/14 0910  WBC 8.9  HGB 8.7*  HCT 28.1*  PLT 383   Coag's No  results found for this basename: APTT, INR,  in the last 168 hours BMET  Recent Labs Lab 06/10/14 0910  NA 134*  K 4.4  CL 102  CO2 20  BUN 22  CREATININE 1.16  GLUCOSE 148*   Electrolytes  Recent Labs Lab 06/10/14 0910  CALCIUM 7.5*   Sepsis Markers No results found for this basename: LATICACIDVEN, PROCALCITON, O2SATVEN,  in the last 168 hours ABG  Recent Labs Lab 06/10/14 1058  PHART 7.350  PCO2ART 37.3  PO2ART 228.0*   Liver Enzymes No results found for this basename: AST, ALT, ALKPHOS, BILITOT, ALBUMIN,  in the last 168 hours Cardiac Enzymes No results found for this basename: TROPONINI, PROBNP,  in the last 168 hours Glucose  Recent Labs Lab 06/10/14 0844  GLUCAP 138*    CXR: NACPD  ASSESSMENT / PLAN:  PULMONARY OETT 9/11>> A: Acute respiratory failure, vent dependence Possible re-exploration 9/12 - therefore not extubated P:  Cont full vent support - settings reviewed and/or adjusted Cont vent bundle Daily SBT if/when meets criteria  CARDIOVASCULAR R brachial A line 9/11>> A: Sinus tachycardia - reactive H/O HTN P:  PRN hydralazine to maintain SBP < 170 mmHg PRN metoprolol to maintain HR < 115/min  RENAL A:  AKI (baselien Cr 0.74) MIld hyponatremia P:   Monitor BMET intermittently Monitor I/Os Correct electrolytes as indicated D5LR per CCS  GASTROINTESTINAL A:  Post laparotomy 9/11 Perforated gastric ulcer P:   Max dose PPI CCS to decide on timing and route of nutrition Needs counseling to avoid all NSAIDS post dicharge   HEMATOLOGIC A:   Post op anemia, no overt bleeding presently P:  DVT px: SCDs Monitor CBC intermittently Transfuse per usual ICU guidelines  INFECTIOUS A:  Peritonitis P:   UC 9/11>> Abx: Zosyn, start date 9/11 >>  Vanc, 9/11 >>    ENDOCRINE A:  DM 2 P:   CBG/SSI (sens) q 4hrs  NEUROLOGIC A:  Post op pain Chronic back pain Opiate habituation H/O depression (paroxetine PTA) P:    RASS goal: -1 Fentanyl gtt Cont paxil once receiving enteral meds   TODAY'S SUMMARY:   I have personally obtained a history, examined the patient, evaluated laboratory and imaging results, formulated the assessment and plan and placed orders. CRITICAL CARE: The patient is critically ill with multiple organ systems failure and requires high complexity decision making for assessment and support, frequent evaluation and titration of therapies, application of advanced monitoring technologies and extensive interpretation of multiple databases. Critical Care Time devoted to patient care services described in this note is 35 minutes.    Beckey Rutter, PA-S  [facility]  Billy Fischer, MD ; [facility] [phone].  After 5:30 PM or weekends, call [phone]   06/10/2014, 11:40 AM

## 2014-06-10 NOTE — Op Note (Signed)
PRE-OPERATIVE DIAGNOSIS: perforated gastric ulcer   POST-OPERATIVE DIAGNOSIS:  Same  PROCEDURE:  Procedure(s): Exploratory laparotomy, repair of perforated pyloric channel ulcer  SURGEON:  Surgeon(s): Almond Lint, MD  ASSIST:  Carman Ching, MD  ANESTHESIA:   general  DRAINS: none   LOCAL MEDICATIONS USED:  NONE  SPECIMEN:  Source of Specimen:  gastric wall to pathology  DISPOSITION OF SPECIMEN:  PATHOLOGY  COUNTS:  YES  DICTATION: .Dragon Dictation  PLAN OF CARE: Admit to inpatient   PATIENT DISPOSITION:  PACU - hemodynamically stable.  EBL:  Minimal  FINDINGS:  Perforated gastric pyloric channel ulcer with gross contamination of abdomen with stomach contents  PROCEDURE:   Pt was identified in the holding area and taken to the OR where he was placed supine on the operating room table.  General anesthesia was induced.  Foley catheter was placed.  The abdomen was clipped, prepped, and draped in sterile fashion.  Timeout was performed according to the surgical safety check list.  When all was correct, we continued.    An upper midline incision was made with a #10 blade.  The subcutaneous tissues were divided with the cautery.  The fascia and peritoneum were entered sharply.  The contents were suctioned out.  The upper abdomen was examined and the source of the leakage was not immediately seen.  The stomach was mobilized and the lesser sac was opened.  The stomach was pulled up and the posterior surface was examined. The defect was located in the posterior pyloric channel. The wall was biopsied.  The defect was reapproximated gently with around eight 2-0 silk sutures.  The omentum was secured over the defect with additional sutures taking care not to strangulate this portion.  The abdomen was then copiously irrigated with multiple L of NS.    The fascia was then closed with #1 looped running PDS suture.  The skin was irrigated and left open.  This was dressed with kerlix and  gauze.    The patient was allowed to emerge from anesthesia and was taken to the PACU in stable condition.  Needle, sponge, and instrument counts were correct x 2.

## 2014-06-10 NOTE — ED Provider Notes (Addendum)
CSN: [id number]     Arrival date & time 06/10/14  0110 History   First MD Initiated Contact with Patient 06/10/14 0129     Chief Complaint  Patient presents with  . Abscess    HPI Patient presents as a transfer from the outside hospital for possible fluid collection an abscess around his prior lumbar fusion site.  Lumbar fusion in July 2015.  His had several admissions for postoperative fluid collection.  He had an an MRI scan obtained at an outside hospital which demonstrated no significant change in the lumbar fluid collection but I think they were concerned given his white count of 19.9.  He does report to me that he developed some rather acute onset abdominal pain and today with associated nausea vomiting.  He denies diarrhea.  He's he's had chills at the day.  He has increasing back pain unrelieved by his pain meds.  He reports the majority of his pain is located in the left side of his abdomen at this time.  Denies hematemesis.  No melena or hematochezia.  Denies weakness of his arms or legs.  His pain at this time is moderate in severity   Past Medical History  Diagnosis Date  . Diabetes mellitus without complication     diet contolled  only  . Hypertension   . Anxiety    Past Surgical History  Procedure Laterality Date  . Back surgery      x2 surgeries  . Nasal dilation    . Rotator cuff repair    . Posterior lumbar fusion 4 level N/A 04/19/2014    Procedure: LUMBAR TWO-THREE,LUMBAR THREE-FOUR,LUMBAR FOUR-FIVE,LUMBAR FIVE-SACRAL-ONE POSTERIOR LUMBAR INTERBODY FUSION/ADD ON TO THORACIC TEN-LUMBAT TWO FUSION;  Surgeon: Barnett Abu, MD;  Location: [facility]UR[facility];  Service: Neurosurgery;  Laterality: N/A;  . Wound exploration N/A 05/11/2014    Procedure: Lumbar WOUND EXPLORATION, evcuation of hematoma;  Surgeon: Temple Pacini, MD;  Location: [facility]UR[facility];  Service: Neurosurgery;  Laterality: N/A;  . Lumbar laminectomy/decompression microdiscectomy N/A 05/16/2014    Procedure: Repeat  evacuation of lumbar hematoma;  Surgeon: Barnett Abu, MD;  Location: [facility] NEURO ORS;  Service: Neurosurgery;  Laterality: N/A;  Repeat evacuation of lumbar hematoma   No family history on file. History  Substance Use Topics  . Smoking status: Current Every Day Smoker -- 1.00 packs/day for 20 years    Types: Cigarettes  . Smokeless tobacco: Not on file  . Alcohol Use: No     Comment: occ    Review of Systems  All other systems reviewed and are negative.     Allergies  Codeine; Hydrocodone; and Other  Home Medications   Prior to Admission medications   Medication Sig Start Date End Date Taking? Authorizing Provider  acetaminophen (TYLENOL) 500 MG tablet Take 2,000 mg by mouth every 6 (six) hours as needed for moderate pain or headache.     Historical Provider, MD  lisinopril (PRINIVIL,ZESTRIL) 20 MG tablet Take 1 tablet (20 mg total) by mouth daily. 04/23/14   Lisbeth Renshaw, MD  naproxen (NAPROSYN) 500 MG tablet Take 500 mg by mouth 2 (two) times daily as needed for moderate pain.     Historical Provider, MD  oxyCODONE (OXY IR/ROXICODONE) 5 MG immediate release tablet Take 1 tablet (5 mg total) by mouth every 4 (four) hours as needed for moderate pain. 05/30/14   Barnett Abu, MD  OxyCODONE (OXYCONTIN) 15 mg T12A 12 hr tablet Take 1 tablet (15 mg total) by mouth every  12 (twelve) hours. 05/30/14   Barnett Abu, MD  PARoxetine (PAXIL) 40 MG tablet Take 1 tablet (40 mg total) by mouth every morning. 04/23/14   Lisbeth Renshaw, MD   BP 147/103  Pulse 128  Temp(Src) 98.4 F (36.9 C) (Oral)  Resp 26  SpO2 99% Physical Exam  Nursing note and vitals reviewed. Constitutional: He is oriented to person, place, and time. He appears well-developed and well-nourished.  HENT:  Head: Normocephalic and atraumatic.  Eyes: EOM are normal.  Neck: Normal range of motion.  Cardiovascular: Regular rhythm, normal heart sounds and intact distal pulses.   Tachycardia  Pulmonary/Chest: Effort  normal and breath sounds normal. No respiratory distress.  Abdominal: Soft. He exhibits no distension.  Left lower quadrant tenderness with guarding.  No rebound  Musculoskeletal: Normal range of motion.  Neurological: He is alert and oriented to person, place, and time.  Skin: Skin is warm and dry.  Psychiatric: He has a normal mood and affect. Judgment normal.    ED Course  Procedures (including critical care time)  CRITICAL CARE Performed by: Lyanne Co Total critical care time: 30 Critical care time was exclusive of separately billable procedures and treating other patients. Critical care was necessary to treat or prevent imminent or life-threatening deterioration. Critical care was time spent personally by me on the following activities: development of treatment plan with patient and/or surrogate as well as nursing, discussions with consultants, evaluation of patient's response to treatment, examination of patient, obtaining history from patient or surrogate, ordering and performing treatments and interventions, ordering and review of laboratory studies, ordering and review of radiographic studies, pulse oximetry and re-evaluation of patient's condition.  Labs Review Outside hospital labs reviewed.  White count 19.9, BUN 13, creatinine 0.96  Imaging Review Ct Abdomen Pelvis W Contrast  06/10/2014   CLINICAL DATA:  Severe left lower quadrant pain.  EXAM: CT ABDOMEN AND PELVIS WITH CONTRAST  TECHNIQUE: Multidetector CT imaging of the abdomen and pelvis was performed using the standard protocol following bolus administration of intravenous contrast.  CONTRAST:  OMNIPAQUE IOHEXOL 300 MG/ML  SOLN  COMPARISON:  MRI of the lumbar spine May 26, 2014  FINDINGS: LUNG BASES: Included view of the lung bases demonstrate small layering left pleural effusion. Visualized heart and pericardium are unremarkable.  SOLID ORGANS: Layering density in the gallbladder suggests sludge. Liver,  spleen, pancreas and adrenal glands are nonsuspicious .  GASTROINTESTINAL TRACT: Distal stomach/ pyloric wall thickening with superimposed perforated fusion posteriorly, with free air. The small and large bowel are normal in course and caliber without inflammatory changes. Colonic diverticulosis. Normal appendix.  KIDNEYS/ URINARY TRACT: Kidneys are orthotopic, demonstrating symmetric enhancement. No nephrolithiasis, hydronephrosis or solid renal masses. The unopacified ureters are normal in course and caliber. Delayed imaging through the kidneys demonstrates symmetric prompt contrast excretion within the proximal urinary collecting system. Urinary bladder is partially distended and unremarkable.  PERITONEUM/RETROPERITONEUM: Large amount of pneumoperitoneum anterior within the upper abdomen. Small a moderate amount of layering ascites without abscess. Aortoiliac vessels are normal in course and caliber, mild calcific atherosclerosis. No lymphadenopathy by CT size criteria. Internal reproductive organs are unremarkable.  SOFT TISSUE/OSSEOUS STRUCTURES: Status post T10 through S1 posterior instrumentation, at T12 chronic appearing burst fracture. L2 through L5 laminectomies with L2 through S1 interbody disc material, which does not appear incorporated. In addition, lucency about the S1 screws concerning for hardware failure possible infection. Peripherally calcified chronic appearing small lumbar paraspinal fluid collection without superimposed inflammatory changes.  IMPRESSION: Perforated  gastric pyloric ulcer with moderate amount of pneumoperitoneum and small to moderate amount of ascites without abscess.  Colonic diverticulosis without convincing evidence of acute diverticulitis.  Extensive spinal surgery, with S1 screws periprosthetic lucency consistent with hardware failure.  Findings discussed with and reconfirmed by Dr.Kaylub Detienne on9/11/2015at2:45 am.   Electronically Signed   By: Awilda Metro   On:  06/10/2014 02:56  I personally reviewed the imaging tests through PACS system I reviewed available ER/hospitalization records through the EMR    EKG Interpretation   Date/Time:  Friday June 10 2014 01:19:51 EDT Ventricular Rate:  128 PR Interval:  170 QRS Duration: 94 QT Interval:  300 QTC Calculation: 438 R Axis:   107 Text Interpretation:  Sinus tachycardia Right ventricular hypertrophy  Inferior infarct, old Lateral leads are also involved No significant  change was found as compared to prior ecg except for tachycardic rate  Confirmed by Jara Feider  MD, Caryn Bee ([id number]) on 06/10/2014 1:31:33 AM      MDM   Final diagnoses:  Postoperative or surgical complication, initial encounter    Sent from outside hospital for possible postsurgical abscess that is lumbar fusion site.  Patient was complaining of abdominal pain and tachycardic on arrival.  CT scan demonstrates perforated gastric ulcer with intra-abdominal free air.  Patient made n.p.o.  Zosyn given.  Patient will need surgical treatment tonight general surgery.    Lyanne Co, MD 06/10/14 7846  Lyanne Co, MD 06/10/14 9629  Lyanne Co, MD 06/10/14 [phone]

## 2014-06-10 NOTE — Progress Notes (Signed)
INITIAL NUTRITION ASSESSMENT  DOCUMENTATION CODES Per approved criteria  -Obesity Unspecified   INTERVENTION:  If unable to extubate within the next 24 hours and able to use gut for nutrition, recommend 30M PEPuP Protocol: initiate TF via OGT with Vital  at 25 ml/h and Prostat 30 ml BID on day 1; on day 2, increase to goal rate of 75 ml/h (1800 ml per day) to provide 1800 kcals (24 kcals/kg ideal weight), 158 gm protein, 1505 ml free water daily.  If expected to need prolonged bowel rest >/= 7-10 days, consider TPN.  NUTRITION DIAGNOSIS: Inadequate oral intake related to inability to eat with altered GI function as evidenced by NPO status.   Goal: Enteral nutrition to provide 60-70% of estimated calorie needs (22-25 kcals/kg ideal body weight) and 100% of estimated protein needs, based on ASPEN guidelines for permissive underfeeding in critically ill obese individuals  Monitor:  TF tolerance/adequacy, weight trend, labs, vent status.  Reason for Assessment: VDRF  43 y.o. male  Admitting Dx: Perforated gastric ulcer  ASSESSMENT: 43 yo M who underwent lumbar fusion earlier in the summer and had several evacuations of hematomas. Went to [facility] on 9/11, then transferred to [facility]. In ED, CT demonstrated free air and a perforated ulcer.  [name] with repair of perforated pyloric channel ulcer on 9/11. Discussed patient in ICU rounds today. May need to return to the OR tomorrow, to remain intubated for potential return to OR.  Patient with significant 11% weight loss over the past 3 months. Patient unable to provide any nutrition history. Nutrition focused physical exam completed.  No muscle or subcutaneous fat depletion noticed.  Patient is currently intubated on ventilator support MV: 9.8 L/min Temp (24hrs), Avg:98.6 F (37 C), Min:98.4 F (36.9 C), Max:98.8 F (37.1 C)  Propofol: none  Height: Ht Readings from Last 1 Encounters:  06/10/14   (1.778 m)    Weight: Wt Readings from Last 1 Encounters:  06/10/14 217 lb 13 oz (98.8 kg)    Ideal Body Weight: 75.5 kg  % Ideal Body Weight: 131%  Wt Readings from Last 10 Encounters:  06/10/14 217 lb 13 oz (98.8 kg)  06/10/14 217 lb 13 oz (98.8 kg)  05/23/14 220 lb 10.9 oz (100.1 kg)  05/12/14 240 lb (108.863 kg)  05/12/14 240 lb (108.863 kg)  05/12/14 240 lb (108.863 kg)  04/19/14 249 lb 1.9 oz (113 kg)  04/19/14 249 lb 1.9 oz (113 kg)  04/13/14 245 lb 13 oz (111.5 kg)    Usual Body Weight: 245 lb 3 months ago  % Usual Body Weight: 89%  BMI:  Body mass index is 31.25 kg/(m^2). class 1 obesity  Estimated Nutritional Needs: Kcal: 2094 Protein: >/= 150 gm Fluid: 2.1 L  Skin: surgical abdominal incision  Diet Order: NPO  EDUCATION NEEDS: -Education not appropriate at this time   Intake/Output Summary (Last 24 hours) at 06/10/14 1147 Last data filed at 06/10/14 1000  Gross per 24 hour  Intake 2896.58 ml  Output    100 ml  Net 2796.58 ml    Last BM: 9/9   Labs:   Recent Labs Lab 06/10/14 0910  NA 134*  K 4.4  CL 102  CO2 20  BUN 22  CREATININE 1.16  CALCIUM 7.5*  GLUCOSE 148*    CBG (last 3)   Recent Labs  06/10/14 0844  GLUCAP 138*    Scheduled Meds: . antiseptic oral rinse  7 mL Mouth Rinse QID  .  chlorhexidine  15 mL Mouth Rinse BID  . [START ON 06/11/2014] Influenza vac split quadrivalent PF  0.5 mL Intramuscular Tomorrow-1000  . pantoprazole (PROTONIX) IV  40 mg Intravenous QHS  . piperacillin-tazobactam (ZOSYN)  IV  3.375 g Intravenous Q8H  . [START ON 06/11/2014] pneumococcal 23 valent vaccine  0.5 mL Intramuscular Tomorrow-1000  . vancomycin  1,250 mg Intravenous Q8H  . vancomycin  1,750 mg Intravenous Once    Continuous Infusions: . dexmedetomidine Stopped (06/10/14 1139)  . dextrose 5% lactated ringers 150 mL/hr at 06/10/14 0939  . fentaNYL infusion INTRAVENOUS 250 mcg/hr (06/10/14 1139)    Past Medical History   Diagnosis Date  . Diabetes mellitus without complication     diet contolled  only  . Hypertension   . Anxiety     Past Surgical History  Procedure Laterality Date  . Back surgery      x2 surgeries  . Nasal dilation    . Rotator cuff repair    . Posterior lumbar fusion 4 level N/A 04/19/2014    Procedure: LUMBAR TWO-THREE,LUMBAR THREE-FOUR,LUMBAR FOUR-FIVE,LUMBAR FIVE-SACRAL-ONE POSTERIOR LUMBAR INTERBODY FUSION/ADD ON TO THORACIC TEN-LUMBAT TWO FUSION;  Surgeon: Barnett Abu, MD;  Location: [facility] NEURO ORS;  Service: Neurosurgery;  Laterality: N/A;  . Wound exploration N/A 05/11/2014    Procedure: Lumbar WOUND EXPLORATION, evcuation of hematoma;  Surgeon: Temple Pacini, MD;  Location: [facility] NEURO ORS;  Service: Neurosurgery;  Laterality: N/A;  . Lumbar laminectomy/decompression microdiscectomy N/A 05/16/2014    Procedure: Repeat evacuation of lumbar hematoma;  Surgeon: Barnett Abu, MD;  Location: [facility] NEURO ORS;  Service: Neurosurgery;  Laterality: N/A;  Repeat evacuation of lumbar hematoma  . Repair of perforated ulcer  06/10/2014    Joaquin Courts, RD, LDN, CNSC Pager [phone] After Hours Pager [phone]

## 2014-06-10 NOTE — Transfer of Care (Signed)
Immediate Anesthesia Transfer of Care Note  Patient: Devin [name]  Procedure(s) Performed: Procedure(s) with comments: EXPLORATORY LAPAROTOMY (N/A) - patch repair of perforated pyloric ulcer  Patient Location: ICU  Anesthesia Type:General  Level of Consciousness: sedated  Airway & Oxygen Therapy: Patient remains intubated per anesthesia plan and Patient placed on Ventilator (see vital sign flow sheet for setting)  Post-op Assessment: Post -op Vital signs reviewed and stable and report given to ICU RN  Post vital signs: Reviewed and stable  Complications: No apparent anesthesia complications

## 2014-06-11 ENCOUNTER — Inpatient Hospital Stay (HOSPITAL_COMMUNITY): Payer: BC Managed Care – PPO

## 2014-06-11 DIAGNOSIS — T889XXS Complication of surgical and medical care, unspecified, sequela: Secondary | ICD-10-CM

## 2014-06-11 DIAGNOSIS — K251 Acute gastric ulcer with perforation: Secondary | ICD-10-CM

## 2014-06-11 DIAGNOSIS — K650 Generalized (acute) peritonitis: Secondary | ICD-10-CM

## 2014-06-11 LAB — CBC
HCT: 24.1 % — ABNORMAL LOW (ref 39.0–52.0)
Hemoglobin: 7.5 g/dL — ABNORMAL LOW (ref 13.0–17.0)
MCH: 30.6 pg (ref 26.0–34.0)
MCHC: 31.1 g/dL (ref 30.0–36.0)
MCV: 98.4 fL (ref 78.0–100.0)
Platelets: 315 10*3/uL (ref 150–400)
RBC: 2.45 MIL/uL — ABNORMAL LOW (ref 4.22–5.81)
RDW: 17.5 % — ABNORMAL HIGH (ref 11.5–15.5)
WBC: 9 10*3/uL (ref 4.0–10.5)

## 2014-06-11 LAB — BASIC METABOLIC PANEL
Anion gap: 10 (ref 5–15)
BUN: 24 mg/dL — AB (ref 6–23)
CHLORIDE: 107 meq/L (ref 96–112)
CO2: 21 mEq/L (ref 19–32)
Calcium: 7.7 mg/dL — ABNORMAL LOW (ref 8.4–10.5)
Creatinine, Ser: 0.86 mg/dL (ref 0.50–1.35)
GFR calc Af Amer: >90 mL/min (ref >90)
GFR calc non Af Amer: >90 mL/min (ref >90)
Glucose, Bld: 115 mg/dL — ABNORMAL HIGH (ref 70–99)
Potassium: 4.1 mEq/L (ref 3.7–5.3)
Sodium: 138 mEq/L (ref 137–147)

## 2014-06-11 LAB — POCT I-STAT 3, ART BLOOD GAS (G3+)
Acid-Base Excess: 1 mmol/L (ref 0.0–2.0)
BICARBONATE: 24.7 meq/L — AB (ref 20.0–24.0)
O2 Saturation: 100 %
PCO2 ART: 35.2 mmHg (ref 35.0–45.0)
TCO2: 26 mmol/L (ref 0–100)
pH, Arterial: 7.457 — ABNORMAL HIGH (ref 7.350–7.450)
pO2, Arterial: 165 mmHg — ABNORMAL HIGH (ref 80.0–100.0)

## 2014-06-11 LAB — URINE CULTURE
Colony Count: NO GROWTH
Culture: NO GROWTH
Special Requests: NORMAL

## 2014-06-11 LAB — GLUCOSE, CAPILLARY
GLUCOSE-CAPILLARY: 117 mg/dL — AB (ref 70–99)
GLUCOSE-CAPILLARY: 91 mg/dL (ref 70–99)
Glucose-Capillary: 118 mg/dL — ABNORMAL HIGH (ref 70–99)
Glucose-Capillary: 131 mg/dL — ABNORMAL HIGH (ref 70–99)
Glucose-Capillary: 94 mg/dL (ref 70–99)
Glucose-Capillary: 100 mg/dL — ABNORMAL HIGH (ref 70–99)

## 2014-06-11 MED ORDER — FLUCONAZOLE IN SODIUM CHLORIDE 200-0.9 MG/100ML-% IV SOLN
200.0000 mg | INTRAVENOUS | Status: AC
  Administered 2014-06-11 – 2014-06-17 (×7): 200 mg via INTRAVENOUS
  Filled 2014-06-11 (×8): qty 100

## 2014-06-11 NOTE — Plan of Care (Signed)
Problem: Phase II Progression Outcomes Goal: Time pt extubated/weaned off vent Outcome: Completed/Met Date Met:  06/11/14 Extubated at [phone]

## 2014-06-11 NOTE — Consult Note (Signed)
PULMONARY / CRITICAL CARE MEDICINE   Name: Devin [name] MRN: [id number] DOB: Feb 21, 1971    ADMISSION DATE:  06/10/2014 CONSULTATION DATE: 06/10/14  REFERRING MD :  [name]  CHIEF COMPLAINT:  Peritonitis  INITIAL PRESENTATION: Pt is a 43yo male with a history of DM and HTN who underwent 2 lumbar surgeries followed by several evacuations of hematomas requiring repeat surgeries in July and Aug.  He recently admitted for fluid collection at the surgical site.  Upon evaluation, pt was tachycardic and c/o pain and tenderness in his abdomen.  CT showed free air consistent with a perforated gastric ulcer.  He was taken to OR for perforation repair. Remained intubated post op. Might require repeat surgery 9/12  STUDIES:  09/11  CT abdomen pelvis with contrast- perforated gastric pyloric ulcer with moderated amount of pneumoperitoneum and small to moderate amount of ascites without abscess, diverticulosis  SIGNIFICANT EVENTS: 07/21  Posterior lumbar fusion 08/12  Wound exploration 08/17  Lumbar laminectomy/decopression microdiscectomy 09/11  Admitted from [facility] 09/11  Exploratory laparotomy, repair of perforated pyloric channel ulcer  SUBJECTIVE: no pressors  VITAL SIGNS: Temp:  [98.5 F (36.9 C)-100 F (37.8 C)] 99.3 F (37.4 C) (09/12 0749) Pulse Rate:  [40-129] 99 (09/12 0800) Resp:  [13-27] 13 (09/12 0800) BP: (129-172)/(65-109) 139/86 mmHg (09/12 0400) SpO2:  [96 %-100 %] 100 % (09/12 0800) Arterial Line BP: (134-176)/(61-107) 151/70 mmHg (09/12 0800) FiO2 (%):  [40 %-60 %] 40 % (09/12 0400) Weight:  [98.8 kg (217 lb 13 oz)] 98.8 kg (217 lb 13 oz) (09/11 0900) HEMODYNAMICS:   VENTILATOR SETTINGS: Vent Mode:  [-] PRVC FiO2 (%):  [40 %-60 %] 40 % Set Rate:  [14 bmp] 14 bmp Vt Set:  [580 mL] 580 mL PEEP:  [5 cmH20] 5 cmH20 Plateau Pressure:  [11 cmH20-20 cmH20] 18 cmH20 INTAKE / OUTPUT:  Intake/Output Summary (Last 24 hours) at 06/11/14 0820 Last data  filed at 06/11/14 0803  Gross per 24 hour  Intake 6054.33 ml  Output   1200 ml  Net 4854.33 ml    PHYSICAL EXAMINATION: General:  WDWN male in minimal discomfort on vent Neuro: rass 0, follows commands HEENT:  Intubated, NAD Cardiovascular:  Tachycardic, no m/r/g Lungs:  ronchi mild Abdomen:  -BS, tender and distended, bandage over surgical incision Ext:  No edema, ROM nl, warm Skin:  Warm, intact except for abdominal wound  LABS:  CBC  Recent Labs Lab 06/10/14 0910 06/11/14 0457  WBC 8.9 9.0  HGB 8.7* 7.5*  HCT 28.1* 24.1*  PLT 383 315   Coag's No results found for this basename: APTT, INR,  in the last 168 hours BMET  Recent Labs Lab 06/10/14 0910 06/11/14 0457  NA 134* 138  K 4.4 4.1  CL 102 107  CO2 20 21  BUN 22 24*  CREATININE 1.16 0.86  GLUCOSE 148* 115*   Electrolytes  Recent Labs Lab 06/10/14 0910 06/11/14 0457  CALCIUM 7.5* 7.7*   Sepsis Markers No results found for this basename: LATICACIDVEN, PROCALCITON, O2SATVEN,  in the last 168 hours ABG  Recent Labs Lab 06/10/14 1058  PHART 7.350  PCO2ART 37.3  PO2ART 228.0*   Liver Enzymes No results found for this basename: AST, ALT, ALKPHOS, BILITOT, ALBUMIN,  in the last 168 hours Cardiac Enzymes No results found for this basename: TROPONINI, PROBNP,  in the last 168 hours Glucose  Recent Labs Lab 06/10/14 1302 06/10/14 1628 06/10/14 2001 06/11/14 0021 06/11/14 0402 06/11/14 0737  GLUCAP 134*  134* 119* 118* 117* 131*    CXR: NACPD  ASSESSMENT / PLAN:  PULMONARY OETT 9/11>> A: Acute respiratory failure, vent dependence Possible re-exploration 9/12 -will clarify this P:   abg reviewed, keep same MV Wean cpap 5 ps 5, goal 30 min  Upright as able pcxr in am for volume status  CARDIOVASCULAR R brachial A line 9/11>> A: Sinus tachycardia - reactive H/O HTN P:  PRN hydralazine to maintain SBP < 170 mmHg PRN metoprolol to maintain HR < 115/min Dc brachial a line at  24 hr if able to reduce risk thrombosis in that location Some HTn remains, precdex addition see neuro then re evaluate this  RENAL A:  AKI (baselien Cr 0.74) MIld hyponatremia P:   Monitor BMET in am  D5LR , follow k on this  GASTROINTESTINAL A:  Post laparotomy 9/11 Perforated gastric ulcer P:   Max dose PPI, low threshold drip Npo strict lft in am   HEMATOLOGIC A:   Post op anemia, no overt bleeding presently Dilutional anemia P:  DVT px: SCDs hct drop, no pressors, HTN consider lowering rate volume  INFECTIOUS A:  Peritonitis P:   UC 9/11>> Abx: Zosyn, start date 9/11 >>  Vanc, 9/11 >>  Low threshold empiric diflucan addition  ENDOCRINE A:  DM 2 P:   CBG/SSI (sens) q 4hrs  NEUROLOGIC A:  Post op pain Chronic back pain Opiate habituation H/O depression (paroxetine PTA) P:   RASS goal: -1 Fentanyl gtt Cont paxil once receiving enteral meds precedex added WUA   TODAY'S SUMMARY: precedex, wean to extubate as plan, NPO strict  I have personally obtained a history, examined the patient, evaluated laboratory and imaging results, formulated the assessment and plan and placed orders. CRITICAL CARE: The patient is critically ill with multiple organ systems failure and requires high complexity decision making for assessment and support, frequent evaluation and titration of therapies, application of advanced monitoring technologies and extensive interpretation of multiple databases. Critical Care Time devoted to patient care services described in this note is 30 minutes.     Mcarthur Rossetti. Tyson Alias, MD, FACP Pgr: [phone] [facility]

## 2014-06-11 NOTE — Progress Notes (Signed)
1 Day Post-Op  Subjective: Intubated and sedated Hemodynamically stable  Objective: Vital signs in last 24 hours: Temp:  [98.5 F (36.9 C)-100 F (37.8 C)] 99.8 F (37.7 C) (09/12 0359) Pulse Rate:  [40-129] 102 (09/12 0600) Resp:  [13-27] 15 (09/12 0600) BP: (129-172)/(65-109) 139/86 mmHg (09/12 0400) SpO2:  [96 %-100 %] 100 % (09/12 0600) Arterial Line BP: (134-176)/(61-107) 146/61 mmHg (09/12 0600) FiO2 (%):  [40 %-60 %] 40 % (09/12 0400) Weight:  [217 lb 13 oz (98.8 kg)] 217 lb 13 oz (98.8 kg) (09/11 0900) Last BM Date: 06/08/14  Intake/Output from previous day: 09/11 0701 - 09/12 0700 In: 6904.3 [I.V.:6291.8; IV Piggyback:612.5] Out: 1150 [Urine:1050; Blood:100] Intake/Output this shift:    Abdomen soft, non distended, dressing intact to open wound  Lab Results:   Recent Labs  06/10/14 0910 06/11/14 0457  WBC 8.9 9.0  HGB 8.7* 7.5*  HCT 28.1* 24.1*  PLT 383 315   BMET  Recent Labs  06/10/14 0910 06/11/14 0457  NA 134* 138  K 4.4 4.1  CL 102 107  CO2 20 21  GLUCOSE 148* 115*  BUN 22 24*  CREATININE 1.16 0.86  CALCIUM 7.5* 7.7*   PT/INR No results found for this basename: LABPROT, INR,  in the last 72 hours ABG  Recent Labs  06/10/14 1058  PHART 7.350  HCO3 20.6    Studies/Results: Ct Abdomen Pelvis W Contrast  06/10/2014   CLINICAL DATA:  Severe left lower quadrant pain.  EXAM: CT ABDOMEN AND PELVIS WITH CONTRAST  TECHNIQUE: Multidetector CT imaging of the abdomen and pelvis was performed using the standard protocol following bolus administration of intravenous contrast.  CONTRAST:  OMNIPAQUE IOHEXOL 300 MG/ML  SOLN  COMPARISON:  MRI of the lumbar spine May 26, 2014  FINDINGS: LUNG BASES: Included view of the lung bases demonstrate small layering left pleural effusion. Visualized heart and pericardium are unremarkable.  SOLID ORGANS: Layering density in the gallbladder suggests sludge. Liver, spleen, pancreas and adrenal glands are  nonsuspicious .  GASTROINTESTINAL TRACT: Distal stomach/ pyloric wall thickening with superimposed perforated fusion posteriorly, with free air. The small and large bowel are normal in course and caliber without inflammatory changes. Colonic diverticulosis. Normal appendix.  KIDNEYS/ URINARY TRACT: Kidneys are orthotopic, demonstrating symmetric enhancement. No nephrolithiasis, hydronephrosis or solid renal masses. The unopacified ureters are normal in course and caliber. Delayed imaging through the kidneys demonstrates symmetric prompt contrast excretion within the proximal urinary collecting system. Urinary bladder is partially distended and unremarkable.  PERITONEUM/RETROPERITONEUM: Large amount of pneumoperitoneum anterior within the upper abdomen. Small [name] moderate amount of layering ascites without abscess. Aortoiliac vessels are normal in course and caliber, mild calcific atherosclerosis. No lymphadenopathy by CT size criteria. Internal reproductive organs are unremarkable.  SOFT TISSUE/OSSEOUS STRUCTURES: Status post T10 through S1 posterior instrumentation, at T12 chronic appearing burst fracture. L2 through L5 laminectomies with L2 through S1 interbody disc material, which does not appear incorporated. In addition, lucency about the S1 screws concerning for hardware failure possible infection. Peripherally calcified chronic appearing small lumbar paraspinal fluid collection without superimposed inflammatory changes.  IMPRESSION: Perforated gastric pyloric ulcer with moderate amount of pneumoperitoneum and small to moderate amount of ascites without abscess.  Colonic diverticulosis without convincing evidence of acute diverticulitis.  Extensive spinal surgery, with S1 screws periprosthetic lucency consistent with hardware failure.  Findings discussed with and reconfirmed by Dr.KEVIN CAMPOS on9/11/2015at2:45 am.   Electronically Signed   By: Awilda Metro   On: 06/10/2014 02:56  Dg Chest Port 1  View  06/10/2014   CLINICAL DATA:  Check endotracheal tube placement  EXAM: PORTABLE CHEST - 1 VIEW  COMPARISON:  04/19/2014  FINDINGS: Cardiac shadow is mildly prominent. [name] nasogastric catheter is seen within the stomach. An endotracheal tube is noted with the tip 3.4 cm above the carina. The lungs are clear. No acute bony abnormality is noted. Postsurgical changes in the thoracic and lumbar spine are seen.  IMPRESSION: Endotracheal tube and nasogastric catheter in satisfactory position. No acute abnormality noted.  These results will be called to the ordering clinician or representative by the Radiologist Assistant, and communication documented in the PACS or zVision Dashboard.   Electronically Signed   By: Alcide Clever M.D.   On: 06/10/2014 09:38    Anti-infectives: Anti-infectives   Start     Dose/Rate Route Frequency Ordered Stop   06/10/14 1900  vancomycin (VANCOCIN) 1,250 mg in sodium chloride 0.9 % 250 mL IVPB     1,250 mg 166.7 mL/hr over 90 Minutes Intravenous Every 8 hours 06/10/14 0927     06/10/14 1700  piperacillin-tazobactam (ZOSYN) IVPB 3.375 g     3.375 g 12.5 mL/hr over 240 Minutes Intravenous Every 8 hours 06/10/14 0927     06/10/14 1100  vancomycin (VANCOCIN) 1,750 mg in sodium chloride 0.9 % 500 mL IVPB     1,750 mg 250 mL/hr over 120 Minutes Intravenous  Once 06/10/14 0927 06/10/14 1256   06/10/14 1030  piperacillin-tazobactam (ZOSYN) IVPB 3.375 g     3.375 g 100 mL/hr over 30 Minutes Intravenous  Once 06/10/14 0927 06/10/14 1030   06/10/14 0300  piperacillin-tazobactam (ZOSYN) IVPB 3.375 g     3.375 g 12.5 mL/hr over 240 Minutes Intravenous  Once 06/10/14 0249 06/10/14 0444      Assessment/Plan: s/p Procedure(s) with comments: EXPLORATORY LAPAROTOMY (N/[name]) - patch repair of perforated pyloric ulcer  Continuing on vent per CCM Plan on leaving NG at least 5 days.  May do contrast study prior to removal May need TNA Continuing IV antibiotics Wound care  LOS: 1 day     Devin [name] 06/11/2014

## 2014-06-11 NOTE — Progress Notes (Signed)
Vent wean, patients heart rate up 150's, bp 160's appears anxious. Lopressor given iv , precedex at low rate. See mar.. Will cont to monitor

## 2014-06-11 NOTE — Procedures (Signed)
Extubation Procedure Note  Patient Details:   Name: Devin [name] DOB: 02-Feb-1971 MRN: [id number]   Airway Documentation:     Evaluation  O2 sats: stable throughout Complications: No apparent complications Patient did tolerate procedure well. Bilateral Breath Sounds: Rhonchi;Other (Comment) (crs) Suctioning: Oral;Airway Yes  Pt tolerated wean, positive for cuff leak, extubated to 4L [place]. No dyspnea or stridor noted after extubation. Pt achieved on IS x 5. Clear bbs. Pt resting comfortably at this time. RT will continue to monitor.   Armando Gang 06/11/2014, 10:00 AM

## 2014-06-11 NOTE — Progress Notes (Signed)
Wasted(in sink) 230 cc of fentanly iv ggt bag, witnessed by [employer]

## 2014-06-12 ENCOUNTER — Inpatient Hospital Stay (HOSPITAL_COMMUNITY): Payer: BC Managed Care – PPO

## 2014-06-12 LAB — CBC WITH DIFFERENTIAL/PLATELET
BASOS ABS: 0 10*3/uL (ref 0.0–0.1)
Basophils Absolute: 0 10*3/uL (ref 0.0–0.1)
Basophils Relative: 0 % (ref 0–1)
Basophils Relative: 0 % (ref 0–1)
EOS ABS: 0.3 10*3/uL (ref 0.0–0.7)
Eosinophils Absolute: 0.1 10*3/uL (ref 0.0–0.7)
Eosinophils Relative: 3 % (ref 0–5)
Eosinophils Relative: 2 % (ref 0–5)
HCT: 23.9 % — ABNORMAL LOW (ref 39.0–52.0)
HEMATOCRIT: 22.9 % — AB (ref 39.0–52.0)
Hemoglobin: 7.3 g/dL — ABNORMAL LOW (ref 13.0–17.0)
Hemoglobin: 7 g/dL — ABNORMAL LOW (ref 13.0–17.0)
LYMPHS ABS: 0.8 10*3/uL (ref 0.7–4.0)
LYMPHS PCT: 11 % — AB (ref 12–46)
Lymphocytes Relative: 8 % — ABNORMAL LOW (ref 12–46)
Lymphs Abs: 1 10*3/uL (ref 0.7–4.0)
MCH: 30 pg (ref 26.0–34.0)
MCH: 31.3 pg (ref 26.0–34.0)
MCHC: 30.5 g/dL (ref 30.0–36.0)
MCHC: 30.6 g/dL (ref 30.0–36.0)
MCV: 98.4 fL (ref 78.0–100.0)
MCV: 102.2 fL — AB (ref 78.0–100.0)
MONO ABS: 0.4 10*3/uL (ref 0.1–1.0)
MONO ABS: 0.5 10*3/uL (ref 0.1–1.0)
Monocytes Relative: 3 % (ref 3–12)
Monocytes Relative: 7 % (ref 3–12)
Neutro Abs: 10.5 10*3/uL — ABNORMAL HIGH (ref 1.7–7.7)
Neutro Abs: 6 10*3/uL (ref 1.7–7.7)
Neutrophils Relative %: 86 % — ABNORMAL HIGH (ref 43–77)
Neutrophils Relative %: 81 % — ABNORMAL HIGH (ref 43–77)
PLATELETS: 178 10*3/uL (ref 150–400)
PLATELETS: 256 10*3/uL (ref 150–400)
RBC: 2.43 MIL/uL — AB (ref 4.22–5.81)
RBC: 2.24 MIL/uL — AB (ref 4.22–5.81)
RDW: 16.6 % — AB (ref 11.5–15.5)
RDW: 17.3 % — AB (ref 11.5–15.5)
WBC: 12.2 10*3/uL — AB (ref 4.0–10.5)
WBC: 7.5 10*3/uL (ref 4.0–10.5)

## 2014-06-12 LAB — COMPREHENSIVE METABOLIC PANEL
ALT: 15 U/L (ref 0–53)
ALT: 12 U/L (ref 0–53)
ANION GAP: 13 (ref 5–15)
ANION GAP: 12 (ref 5–15)
AST: 20 U/L (ref 0–37)
AST: 20 U/L (ref 0–37)
Albumin: 1.9 g/dL — ABNORMAL LOW (ref 3.5–5.2)
Albumin: 1.7 g/dL — ABNORMAL LOW (ref 3.5–5.2)
Alkaline Phosphatase: 90 U/L (ref 39–117)
Alkaline Phosphatase: 109 U/L (ref 39–117)
BILIRUBIN TOTAL: 0.2 mg/dL — AB (ref 0.3–1.2)
BUN: 79 mg/dL — AB (ref 6–23)
BUN: 13 mg/dL (ref 6–23)
CALCIUM: 9.5 mg/dL (ref 8.4–10.5)
CHLORIDE: 111 meq/L (ref 96–112)
CO2: 24 meq/L (ref 19–32)
CO2: 23 meq/L (ref 19–32)
CREATININE: 3.84 mg/dL — AB (ref 0.50–1.35)
CREATININE: 0.63 mg/dL (ref 0.50–1.35)
Calcium: 8 mg/dL — ABNORMAL LOW (ref 8.4–10.5)
Chloride: 103 mEq/L (ref 96–112)
GFR calc Af Amer: >90 mL/min (ref >90)
GFR, EST AFRICAN AMERICAN: 20 mL/min — AB (ref >90)
GFR, EST NON AFRICAN AMERICAN: 18 mL/min — AB (ref >90)
GLUCOSE: 82 mg/dL (ref 70–99)
Glucose, Bld: 104 mg/dL — ABNORMAL HIGH (ref 70–99)
Potassium: 4.1 mEq/L (ref 3.7–5.3)
Potassium: 3.4 mEq/L — ABNORMAL LOW (ref 3.7–5.3)
Sodium: 148 mEq/L — ABNORMAL HIGH (ref 137–147)
Sodium: 138 mEq/L (ref 137–147)
TOTAL PROTEIN: 5.5 g/dL — AB (ref 6.0–8.3)
Total Bilirubin: <0.2 mg/dL — ABNORMAL LOW (ref 0.3–1.2)
Total Protein: 5.8 g/dL — ABNORMAL LOW (ref 6.0–8.3)

## 2014-06-12 LAB — GLUCOSE, CAPILLARY
GLUCOSE-CAPILLARY: 85 mg/dL (ref 70–99)
GLUCOSE-CAPILLARY: 92 mg/dL (ref 70–99)
GLUCOSE-CAPILLARY: 123 mg/dL — AB (ref 70–99)
GLUCOSE-CAPILLARY: 70 mg/dL (ref 70–99)
GLUCOSE-CAPILLARY: 132 mg/dL — AB (ref 70–99)
Glucose-Capillary: 68 mg/dL — ABNORMAL LOW (ref 70–99)
Glucose-Capillary: 85 mg/dL (ref 70–99)
Glucose-Capillary: 87 mg/dL (ref 70–99)
Glucose-Capillary: 91 mg/dL (ref 70–99)
Glucose-Capillary: 94 mg/dL (ref 70–99)

## 2014-06-12 MED ORDER — POTASSIUM CHLORIDE 10 MEQ/100ML IV SOLN
10.0000 meq | INTRAVENOUS | Status: AC
  Administered 2014-06-12 – 2014-06-13 (×2): 10 meq via INTRAVENOUS
  Filled 2014-06-12 (×2): qty 100

## 2014-06-12 MED ORDER — SODIUM CHLORIDE 0.45 % IV SOLN
INTRAVENOUS | Status: DC
  Administered 2014-06-12: 11:00:00 via INTRAVENOUS

## 2014-06-12 MED ORDER — DEXTROSE 50 % IV SOLN
25.0000 mL | Freq: Once | INTRAVENOUS | Status: AC | PRN
  Administered 2014-06-12: 25 mL via INTRAVENOUS

## 2014-06-12 MED ORDER — DEXTROSE 50 % IV SOLN
INTRAVENOUS | Status: AC
  Filled 2014-06-12: qty 50

## 2014-06-12 MED ORDER — DEXTROSE-NACL 5-0.9 % IV SOLN
INTRAVENOUS | Status: DC
  Administered 2014-06-12 – 2014-06-20 (×9): via INTRAVENOUS

## 2014-06-12 MED ORDER — DEXTROSE 50 % IV SOLN
INTRAVENOUS | Status: AC
  Administered 2014-06-12: 25 mL
  Filled 2014-06-12: qty 50

## 2014-06-12 NOTE — Consult Note (Signed)
PULMONARY / CRITICAL CARE MEDICINE   Name: Devin [name] MRN: [id number] DOB: 1970-11-12    ADMISSION DATE:  06/10/2014 CONSULTATION DATE: 06/10/14  REFERRING MD :  [name]  CHIEF COMPLAINT:  Peritonitis  INITIAL PRESENTATION: Pt is a 43yo male with a history of DM and HTN who underwent 2 lumbar surgeries followed by several evacuations of hematomas requiring repeat surgeries in July and Aug.  He recently admitted for fluid collection at the surgical site.  Upon evaluation, pt was tachycardic and c/o pain and tenderness in his abdomen.  CT showed free air consistent with a perforated gastric ulcer.  He was taken to OR for perforation repair. Remained intubated post op. Might require repeat surgery 9/12  STUDIES:  09/11  CT abdomen pelvis with contrast- perforated gastric pyloric ulcer with moderated amount of pneumoperitoneum and small to moderate amount of ascites without abscess, diverticulosis  SIGNIFICANT EVENTS: 07/21  Posterior lumbar fusion 08/12  Wound exploration 08/17  Lumbar laminectomy/decopression microdiscectomy 09/11  Admitted from [facility] 09/11  Exploratory laparotomy, repair of perforated pyloric channel ulcer 9/12- extubated  SUBJECTIVE: no distress  VITAL SIGNS: Temp:  [98.1 F (36.7 C)-99 F (37.2 C)] 98.7 F (37.1 C) (09/13 0750) Pulse Rate:  [29-108] 92 (09/13 0700) Resp:  [12-26] 13 (09/13 0700) BP: (114-149)/(71-115) 125/78 mmHg (09/13 0700) SpO2:  [68 %-100 %] 100 % (09/13 0700) Arterial Line BP: (94-132)/(46-69) 129/69 mmHg (09/12 1830) Weight:  [103.9 kg (229 lb 0.9 oz)] 103.9 kg (229 lb 0.9 oz) (09/13 0400) HEMODYNAMICS:   VENTILATOR SETTINGS:   INTAKE / OUTPUT:  Intake/Output Summary (Last 24 hours) at 06/12/14 0942 Last data filed at 06/12/14 0900  Gross per 24 hour  Intake   2455 ml  Output   1755 ml  Net    700 ml    PHYSICAL EXAMINATION: General: awake, not on vent, no distress Neuro: rass 0, follows  commands HEENT:  jvd wnl Cardiovascular:  Tachycardic resolved s1 s2  no m/r/g Lungs:  CTA Abdomen:  -BS, tender and less distended, bandage over surgical incision clean Ext:  No edema, ROM nl, warm Skin:  Warm, intact except for abdominal wound  LABS:  CBC  Recent Labs Lab 06/10/14 0910 06/11/14 0457 06/12/14 0445  WBC 8.9 9.0 12.2*  HGB 8.7* 7.5* 7.3*  HCT 28.1* 24.1* 23.9*  PLT 383 315 178   Coag's No results found for this basename: APTT, INR,  in the last 168 hours BMET  Recent Labs Lab 06/10/14 0910 06/11/14 0457 06/12/14 0445  NA 134* 138 148*  K 4.4 4.1 4.1  CL 102 107 111  CO2 BUN 22 24* 79*  CREATININE 1.16 0.86 3.84*  GLUCOSE 148* 115* 104*   Electrolytes  Recent Labs Lab 06/10/14 0910 06/11/14 0457 06/12/14 0445  CALCIUM 7.5* 7.7* 9.5   Sepsis Markers No results found for this basename: LATICACIDVEN, PROCALCITON, O2SATVEN,  in the last 168 hours ABG  Recent Labs Lab 06/10/14 1058 06/11/14 0929  PHART 7.350 7.457*  PCO2ART 37.3 35.2  PO2ART 228.0* 165.0*   Liver Enzymes  Recent Labs Lab 06/12/14 0445  AST 20  ALT 15  ALKPHOS 90  BILITOT 0.2*  ALBUMIN 1.9*   Cardiac Enzymes No results found for this basename: TROPONINI, PROBNP,  in the last 168 hours Glucose  Recent Labs Lab 06/11/14 1112 06/11/14 1550 06/11/14 1913 06/11/14 2338 06/12/14 0347 06/12/14 0740  GLUCAP 94 100* 91 91 94 85    CXR: NACPD  ASSESSMENT / PLAN:  PULMONARY OETT 9/11>> A: Acute respiratory failure, vent dependence- resolved atx P:   IS important Ambulation when ok by CCS  CARDIOVASCULAR R brachial A line 9/11>>9/12 A: Sinus tachycardia - reactive H/O HTN Normotension 9/13 P:  PRN metoprolol to maintain HR < 115/min Consider dc tele  RENAL A:  AKI (baselien Cr 0.74) ERROR AM LABS? 9/13 MIld hyponatremia P:   Monitor BMET STAT now, output is good D5LR dc as has K in bag , just in case renal failure, which I think  is error Change to 1/2 NS  GASTROINTESTINAL A:  Post laparotomy 9/11 Perforated gastric ulcer P:   Max dose PPI Npo strict per ccs  HEMATOLOGIC A:   Post op anemia, no overt bleeding presently Dilutional anemia P:  DVT px: SCDs Cbc repeat  INFECTIOUS A:  Peritonitis P:   UC 9/11>> Abx: Zosyn, start date 9/11 >>  Vanc, 9/11 >> 9/13 Diflucan 9/12>>>7 days   ENDOCRINE A:  DM 2 P:   CBG/SSI (sens) q 4hrs  NEUROLOGIC A:  Post op pain Chronic back pain Opiate habituation H/O depression (paroxetine PTA) P:   RASS goal: -1 Fentanyl gtt Cont paxil once receiving enteral meds Will need pt   TODAY'S SUMMARY: repeat chem , error labs>?, to floor, change fludis , IS, dc vanc  I have personally obtained a history, examined the patient, evaluated laboratory and imaging results, formulated the assessment and plan and placed orders. CRITICAL CARE: The patient is critically ill with multiple organ systems failure and requires high complexity decision making for assessment and support, frequent evaluation and titration of therapies, application of advanced monitoring technologies and extensive interpretation of multiple databases. Critical Care Time devoted to patient care services described in this note is 30 minutes.     Mcarthur Rossetti. Tyson Alias, MD, FACP Pgr: [phone] [facility]

## 2014-06-12 NOTE — Progress Notes (Addendum)
2 Days Post-Op  Subjective: Pt extubated and feeling much better  Pain improved no SOB  Objective: Vital signs in last 24 hours: Temp:  [98.1 F (36.7 C)-99 F (37.2 C)] 98.7 F (37.1 C) (09/13 0750) Pulse Rate:  [29-109] 92 (09/13 0700) Resp:  [12-26] 13 (09/13 0700) BP: (114-156)/(67-115) 125/78 mmHg (09/13 0700) SpO2:  [68 %-100 %] 100 % (09/13 0700) Arterial Line BP: (94-162)/(46-71) 129/69 mmHg (09/12 1830) FiO2 (%):  [40 %] 40 % (09/12 0830) Weight:  [229 lb 0.9 oz (103.9 kg)] 229 lb 0.9 oz (103.9 kg) (09/13 0400) Last BM Date: 06/08/14  Intake/Output from previous day: 09/12 0701 - 09/13 0700 In: 2530 [I.V.:1650; NG/GT:30; IV Piggyback:850] Out: 1605 [Urine:1605] Intake/Output this shift:    Resp: clear to auscultation bilaterally Cardio: regular rate and rhythm, S1, S2 normal, no murmur, click, rub or gallop Incision/Wound: open abdominal wound clean and packed.  Back wound draining chronic soft mild abdominal distention  Lab Results:   Recent Labs  06/11/14 0457 06/12/14 0445  WBC 9.0 12.2*  HGB 7.5* 7.3*  HCT 24.1* 23.9*  PLT 315 178   BMET  Recent Labs  06/11/14 0457 06/12/14 0445  NA 138 148*  K 4.1 4.1  CL 107 111  CO2 21 24  GLUCOSE 115* 104*  BUN 24* 79*  CREATININE 0.86 3.84*  CALCIUM 7.7* 9.5   PT/INR No results found for this basename: LABPROT, INR,  in the last 72 hours ABG  Recent Labs  06/10/14 1058 06/11/14 0929  PHART 7.350 7.457*  HCO3 20.6 24.7*    Studies/Results: Dg Chest Port 1 View  06/11/2014   CLINICAL DATA:  Respiratory failure  EXAM: PORTABLE CHEST - 1 VIEW  COMPARISON:  05/31/2014  FINDINGS: Very limited inspiratory effect. Endotracheal tube and NG tube unchanged. Right lung is clear. Retrocardiac left lower lobe opacity is increased when compared to prior study.  IMPRESSION: Increased retrocardiac left lower lobe opacification consistent with lower lobe consolidation.   Electronically Signed   By: Esperanza Heir M.D.   On: 06/11/2014 08:24   Dg Chest Port 1 View  06/10/2014   CLINICAL DATA:  Check endotracheal tube placement  EXAM: PORTABLE CHEST - 1 VIEW  COMPARISON:  04/19/2014  FINDINGS: Cardiac shadow is mildly prominent. A nasogastric catheter is seen within the stomach. An endotracheal tube is noted with the tip 3.4 cm above the carina. The lungs are clear. No acute bony abnormality is noted. Postsurgical changes in the thoracic and lumbar spine are seen.  IMPRESSION: Endotracheal tube and nasogastric catheter in satisfactory position. No acute abnormality noted.  These results will be called to the ordering clinician or representative by the Radiologist Assistant, and communication documented in the PACS or zVision Dashboard.   Electronically Signed   By: Alcide Clever M.D.   On: 06/10/2014 09:38    Anti-infectives: Anti-infectives   Start     Dose/Rate Route Frequency Ordered Stop   06/11/14 0900  fluconazole (DIFLUCAN) IVPB 200 mg     200 mg 100 mL/hr over 60 Minutes Intravenous Every 24 hours 06/11/14 0832 06/18/14 0859   06/10/14 1900  vancomycin (VANCOCIN) 1,250 mg in sodium chloride 0.9 % 250 mL IVPB     1,250 mg 166.7 mL/hr over 90 Minutes Intravenous Every 8 hours 06/10/14 0927     06/10/14 1700  piperacillin-tazobactam (ZOSYN) IVPB 3.375 g     3.375 g 12.5 mL/hr over 240 Minutes Intravenous Every 8 hours 06/10/14 4782  06/10/14 1100  vancomycin (VANCOCIN) 1,750 mg in sodium chloride 0.9 % 500 mL IVPB     1,750 mg 250 mL/hr over 120 Minutes Intravenous  Once 06/10/14 0927 06/10/14 1256   06/10/14 1030  piperacillin-tazobactam (ZOSYN) IVPB 3.375 g     3.375 g 100 mL/hr over 30 Minutes Intravenous  Once 06/10/14 0927 06/10/14 1030   06/10/14 0300  piperacillin-tazobactam (ZOSYN) IVPB 3.375 g     3.375 g 12.5 mL/hr over 240 Minutes Intravenous  Once 06/10/14 0249 06/10/14 0444      Assessment/Plan: s/p Procedure(s) with comments: EXPLORATORY LAPAROTOMY (N/A) - patch  repair of perforated pyloric ulcer To floor PPI  DVT prevention HOLD HEPARIN DUE TO ULCER [facility] Back wound  Still draining.  Need to get Dr Danielle Dess to provide opinion/ plans for this Cont NGT OOB AKI       Cr stable and good uop   follow electrolytes and keep foley for strict I/O DM 2 cont SSI  Chronic back issues   LOS: 2 days    Izea Livolsi A. 06/12/2014

## 2014-06-13 ENCOUNTER — Encounter (HOSPITAL_COMMUNITY): Payer: Self-pay | Admitting: General Surgery

## 2014-06-13 LAB — COMPREHENSIVE METABOLIC PANEL
ALBUMIN: 1.8 g/dL — AB (ref 3.5–5.2)
ALK PHOS: 108 U/L (ref 39–117)
ALT: 11 U/L (ref 0–53)
AST: 14 U/L (ref 0–37)
Anion gap: 12 (ref 5–15)
BILIRUBIN TOTAL: 0.2 mg/dL — AB (ref 0.3–1.2)
BUN: 9 mg/dL (ref 6–23)
CHLORIDE: 104 meq/L (ref 96–112)
CO2: 24 meq/L (ref 19–32)
CREATININE: 0.64 mg/dL (ref 0.50–1.35)
Calcium: 8.2 mg/dL — ABNORMAL LOW (ref 8.4–10.5)
GFR calc Af Amer: >90 mL/min (ref >90)
GFR calc non Af Amer: >90 mL/min (ref >90)
Glucose, Bld: 86 mg/dL (ref 70–99)
Potassium: 3.2 mEq/L — ABNORMAL LOW (ref 3.7–5.3)
SODIUM: 140 meq/L (ref 137–147)
Total Protein: 5.7 g/dL — ABNORMAL LOW (ref 6.0–8.3)

## 2014-06-13 LAB — GLUCOSE, CAPILLARY
GLUCOSE-CAPILLARY: 88 mg/dL (ref 70–99)
GLUCOSE-CAPILLARY: 85 mg/dL (ref 70–99)
GLUCOSE-CAPILLARY: 80 mg/dL (ref 70–99)
Glucose-Capillary: 76 mg/dL (ref 70–99)
Glucose-Capillary: 87 mg/dL (ref 70–99)
Glucose-Capillary: 86 mg/dL (ref 70–99)
Glucose-Capillary: 96 mg/dL (ref 70–99)
Glucose-Capillary: 94 mg/dL (ref 70–99)

## 2014-06-13 LAB — URINALYSIS, ROUTINE W REFLEX MICROSCOPIC
BILIRUBIN URINE: NEGATIVE
Glucose, UA: NEGATIVE mg/dL
Ketones, ur: 15 mg/dL — AB
Leukocytes, UA: NEGATIVE
Nitrite: NEGATIVE
PROTEIN: 30 mg/dL — AB
Specific Gravity, Urine: 1.022 (ref 1.005–1.030)
Urobilinogen, UA: 0.2 mg/dL (ref 0.0–1.0)
pH: 6 (ref 5.0–8.0)

## 2014-06-13 LAB — CBC
HCT: 25.1 % — ABNORMAL LOW (ref 39.0–52.0)
Hemoglobin: 7.4 g/dL — ABNORMAL LOW (ref 13.0–17.0)
MCH: 29.7 pg (ref 26.0–34.0)
MCHC: 29.5 g/dL — ABNORMAL LOW (ref 30.0–36.0)
MCV: 100.8 fL — ABNORMAL HIGH (ref 78.0–100.0)
PLATELETS: 305 10*3/uL (ref 150–400)
RBC: 2.49 MIL/uL — AB (ref 4.22–5.81)
RDW: 17.2 % — AB (ref 11.5–15.5)
WBC: 7.5 10*3/uL (ref 4.0–10.5)

## 2014-06-13 LAB — URINE MICROSCOPIC-ADD ON

## 2014-06-13 LAB — C-REACTIVE PROTEIN: CRP: 22 mg/dL — AB (ref <0.60)

## 2014-06-13 LAB — SEDIMENTATION RATE: Sed Rate: 115 mm/hr — ABNORMAL HIGH (ref 0–16)

## 2014-06-13 MED ORDER — POTASSIUM CHLORIDE 10 MEQ/100ML IV SOLN
10.0000 meq | INTRAVENOUS | Status: AC
  Administered 2014-06-13 (×4): 10 meq via INTRAVENOUS
  Filled 2014-06-13 (×2): qty 100

## 2014-06-13 NOTE — Consult Note (Addendum)
WOC wound consult note Reason for Consult: Consult requested to place abd Vac dressing.  Neurosurgery following for back wound according to progress notes. CCS team following for assessment and plan of care to abd wound. Wound type: Full thickness post-op wound to midline abd. Measurement:7X2X2cm Wound bed: beefy red with patchy areas of yellow adipose Drainage (amount, consistency, odor) Scant amt yellow drainage, no odor Periwound: Intact skin surrounding. Dressing procedure/placement/frequency: Applied one piece of black sponge to cont suction.  Tolerated with minimal amt discomfort.  Plan for bedside nurses to change Q M/W/F. Please re-consult if further assistance is needed.  Thank-you,  Cammie Mcgee MSN, RN, CWOCN, [place], CNS [phone]

## 2014-06-13 NOTE — Care Management Note (Signed)
    Page 1 of 2   07/03/2014     5:09:21 PM CARE MANAGEMENT NOTE 07/03/2014  Patient:  RAFFAEL, BUGARIN   Account Number:  [id number]  Date Initiated:  06/13/2014  Documentation initiated by:  Ronny Flurry  Subjective/Objective Assessment:     Action/Plan:   Anticipated DC Date:     Anticipated DC Plan:  HOME W HOME HEALTH SERVICES         Choice offered to / List presented to:  C-1 Patient        HH arranged  HH-1 RN  HH-2 PT  HH-3 OT      [facility] agency  [facility]   Status of service:  Completed, signed off Medicare Important Message given?   (If response is "NO", the following Medicare IM given date fields will be blank) Date Medicare IM given:   Medicare IM given by:   Date Additional Medicare IM given:   Additional Medicare IM given by:    Discharge Disposition:  HOME W HOME HEALTH SERVICES  Per UR Regulation:    If discussed at Long Length of Stay Meetings, dates discussed:   06/16/2014  06/21/2014  06/23/2014  06/28/2014  06/30/2014    Comments:  07/02/14 Cm notified of pt discharge to [facility] rep.  No other Cmneeds were communicated.  Freddy Jaksch, BSN, CM [phone].  06-30-14 Patient has decided to discharge to home with Home Health . [facility] updated. Ronny Flurry RN BSN [phone]    06-20-14 Home wound VAC is in patient's room. PT / OT recommended home health PT/ OT and 24 hour supervision. Patient states he lives alone , has family however, family works 40 to 50 miles away . He has an aunt , however she just had knee replacement. Patient feels that once his pain is under controll and has a better fitting brace he will not need 24 hour supervision.  At present he cannot afford to hire anyone to stay with him , and no one he knows is available .  Will continue to follow progress.  Ronny Flurry RN BSN [phone]   06-13-14 [facility] application faxed to [employer] at 1430 . Confirmed face sheet information with patient. Ronny Flurry RN BSN

## 2014-06-13 NOTE — Consult Note (Signed)
Reason for Consult: Area of breakdown central portion of the incision Referring Physician: Dr. Renne Crigler  Devin [name] is an 43 y.o. male.  HPI: Patient is a 43 year old individual known to me from lumbar surgery this summer. He's had a complicated course with a wound hematoma that was drained approximately 3 weeks after his initial surgery in July. Approximately 10 days later the wound hematoma reaccumulated and he required a second drainage. After a prolonged stay in the hospital he was discharged home. 10 days later he presented to the emergency room and an MRI suggested that he had a wound infection. Is transferred to come hospital. The patient was afebrile did not have an increased white count and the wound itself did not appear infected. He remained afebrile and a subsequent repeat MRI demonstrated that his fluid collection was indeed receding. He is discharged home on August 31. He apparently presented again complaining of back pain but now was found to have a perforated gastric ulcer. It is noted that he had a small area of drainage on his lumbar incision. On today's exam I note that this area measures about 2 cm is in the midline of the wound. It appears superficial. Currently the patient does not have a white count, he is severely anemic. A sedimentation rate and C-reactive proteins not available.  Past Medical History  Diagnosis Date  . Diabetes mellitus without complication     diet contolled  only  . Hypertension   . Anxiety     Past Surgical History  Procedure Laterality Date  . Back surgery      x2 surgeries  . Nasal dilation    . Rotator cuff repair    . Posterior lumbar fusion 4 level N/A 04/19/2014    Procedure: LUMBAR TWO-THREE,LUMBAR THREE-FOUR,LUMBAR FOUR-FIVE,LUMBAR FIVE-SACRAL-ONE POSTERIOR LUMBAR INTERBODY FUSION/ADD ON TO THORACIC TEN-LUMBAT TWO FUSION;  Surgeon: Kristeen Miss, MD;  Location: [facility];  Service: Neurosurgery;  Laterality: N/A;  . Wound  exploration N/A 05/11/2014    Procedure: Lumbar WOUND EXPLORATION, evcuation of hematoma;  Surgeon: Charlie Pitter, MD;  Location: [facility];  Service: Neurosurgery;  Laterality: N/A;  . Lumbar laminectomy/decompression microdiscectomy N/A 05/16/2014    Procedure: Repeat evacuation of lumbar hematoma;  Surgeon: Kristeen Miss, MD;  Location: [facility];  Service: Neurosurgery;  Laterality: N/A;  Repeat evacuation of lumbar hematoma  . Repair of perforated ulcer  06/10/2014  . Laparotomy N/A 06/10/2014    Procedure: EXPLORATORY LAPAROTOMY;  Surgeon: Stark Klein, MD;  Location: [facility];  Service: General;  Laterality: N/A;  patch repair of perforated pyloric ulcer    History reviewed. No pertinent family history.  Social History:  reports that he has been smoking Cigarettes.  He has a 20 pack-year smoking history. He has quit using smokeless tobacco. He reports that he does not drink alcohol or use illicit drugs.  Allergies:  Allergies  Allergen Reactions  . Codeine Other (See Comments)    Skin "crawl"  . Hydrocodone Other (See Comments)    Skin "crawl"  . Other Nausea Only and Other (See Comments)    Darvocet    Medications: I have reviewed the patient's current medications.  Results for orders placed during the hospital encounter of 06/10/14 (from the past 48 hour(s))  GLUCOSE, CAPILLARY     Status: None   Collection Time    06/11/14 11:12 AM      Result Value Ref Range   Glucose-Capillary 94  70 - 99 mg/dL  GLUCOSE, CAPILLARY     Status: Abnormal   Collection Time    06/11/14  3:50 PM      Result Value Ref Range   Glucose-Capillary 100 (*) 70 - 99 mg/dL  GLUCOSE, CAPILLARY     Status: None   Collection Time    06/11/14  7:13 PM      Result Value Ref Range   Glucose-Capillary 91  70 - 99 mg/dL  GLUCOSE, CAPILLARY     Status: None   Collection Time    06/11/14 11:38 PM      Result Value Ref Range   Glucose-Capillary 91  70 - 99 mg/dL  GLUCOSE, CAPILLARY     Status: None    Collection Time    06/12/14  3:47 AM      Result Value Ref Range   Glucose-Capillary 94  70 - 99 mg/dL  COMPREHENSIVE METABOLIC PANEL     Status: Abnormal   Collection Time    06/12/14  4:45 AM      Result Value Ref Range   Sodium 148 (*) 137 - 147 mEq/L   Comment: DELTA CHECK NOTED     QUESTIONABLE RESULTS, RECOMMEND RECOLLECT TO VERIFY     C.HAYES RN 1779 06/12/14 E.GADDY   Potassium 4.1  3.7 - 5.3 mEq/L   Comment: QUESTIONABLE RESULTS, RECOMMEND RECOLLECT TO VERIFY     C.HAYES RN 3903 06/12/14 E.GADDY   Chloride 111  96 - 112 mEq/L   Comment: QUESTIONABLE RESULTS, RECOMMEND RECOLLECT TO VERIFY     C.HAYES RN 0092 06/12/14 E.GADDY   CO2 24  19 - 32 mEq/L   Comment: QUESTIONABLE RESULTS, RECOMMEND RECOLLECT TO VERIFY     C.HAYES RN 3300 06/12/14 E.GADDY   Glucose, Bld 104 (*) 70 - 99 mg/dL   Comment: QUESTIONABLE RESULTS, RECOMMEND RECOLLECT TO VERIFY     C.HAYES RN 7622 06/12/14 E.GADDY   BUN 79 (*) 6 - 23 mg/dL   Comment: DELTA CHECK NOTED     QUESTIONABLE RESULTS, RECOMMEND RECOLLECT TO VERIFY     C.HAYES RN 6333 06/12/14 E.GADDY   Creatinine, Ser 3.84 (*) 0.50 - 1.35 mg/dL   Comment: DELTA CHECK NOTED     QUESTIONABLE RESULTS, RECOMMEND RECOLLECT TO VERIFY     C.HAYES RN 5456 06/12/14 E.GADDY   Calcium 9.5  8.4 - 10.5 mg/dL   Comment: QUESTIONABLE RESULTS, RECOMMEND RECOLLECT TO VERIFY     C.HAYES RN 2563 06/12/14 E.GADDY   Total Protein 5.8 (*) 6.0 - 8.3 g/dL   Comment: QUESTIONABLE RESULTS, RECOMMEND RECOLLECT TO VERIFY     C.HAYES RN 8937 06/12/14 E.GADDY   Albumin 1.9 (*) 3.5 - 5.2 g/dL   Comment: QUESTIONABLE RESULTS, RECOMMEND RECOLLECT TO VERIFY     C.HAYES RN 3428 06/12/14 E.GADDY   AST 20  0 - 37 U/L   Comment: QUESTIONABLE RESULTS, RECOMMEND RECOLLECT TO VERIFY     C.HAYES RN 7681 06/12/14 E.GADDY   ALT 15  0 - 53 U/L   Comment: QUESTIONABLE RESULTS, RECOMMEND RECOLLECT TO VERIFY     C.HAYES RN 1572 06/12/14 E.GADDY   Alkaline Phosphatase 90  39 - 117 U/L   Comment:  QUESTIONABLE RESULTS, RECOMMEND RECOLLECT TO VERIFY     C.HAYES RN 6203 06/12/14 E.GADDY   Total Bilirubin 0.2 (*) 0.3 - 1.2 mg/dL   Comment: QUESTIONABLE RESULTS, RECOMMEND RECOLLECT TO VERIFY     C.HAYES RN 5597 06/12/14 E.GADDY   GFR calc non Af Amer 18 (*) >90 mL/min   Comment:  QUESTIONABLE RESULTS, RECOMMEND RECOLLECT TO VERIFY     C.HAYES RN [phone] 06/12/14 E.GADDY   GFR calc Af Amer 20 (*) >90 mL/min   Comment: QUESTIONABLE RESULTS, RECOMMEND RECOLLECT TO VERIFY     C.HAYES RN 7616 06/12/14 E.GADDY     (NOTE)     The eGFR has been calculated using the CKD EPI equation.     This calculation has not been validated in all clinical situations.     eGFR's persistently <90 mL/min signify possible Chronic Kidney     Disease.   Anion gap 13  5 - 15   Comment: QUESTIONABLE RESULTS, RECOMMEND RECOLLECT TO VERIFY     C.HAYES RN 0737 06/12/14 E.GADDY  CBC WITH DIFFERENTIAL     Status: Abnormal   Collection Time    06/12/14  4:45 AM      Result Value Ref Range   WBC 12.2 (*) 4.0 - 10.5 K/uL   Comment: QUESTIONABLE RESULTS, RECOMMEND RECOLLECT TO VERIFY     C.HAYES RN 1062 06/12/14 E.GADDY   RBC 2.43 (*) 4.22 - 5.81 MIL/uL   Comment: QUESTIONABLE RESULTS, RECOMMEND RECOLLECT TO VERIFY     C.HAYES RN 6948 06/12/14 E.GADDY   Hemoglobin 7.3 (*) 13.0 - 17.0 g/dL   Comment: QUESTIONABLE RESULTS, RECOMMEND RECOLLECT TO VERIFY     C.HAYES RN 5462 06/12/14 E.GADDY   HCT 23.9 (*) 39.0 - 52.0 %   Comment: QUESTIONABLE RESULTS, RECOMMEND RECOLLECT TO VERIFY     C.HAYES RN 7035 06/12/14 E.GADDY   MCV 98.4  78.0 - 100.0 fL   Comment: QUESTIONABLE RESULTS, RECOMMEND RECOLLECT TO VERIFY     C.HAYES RN 0093 06/12/14 E.GADDY   MCH 30.0  26.0 - 34.0 pg   Comment: QUESTIONABLE RESULTS, RECOMMEND RECOLLECT TO VERIFY     C.HAYES RN 8182 06/12/14 E.GADDY   MCHC 30.5  30.0 - 36.0 g/dL   Comment: QUESTIONABLE RESULTS, RECOMMEND RECOLLECT TO VERIFY     C.HAYES RN 9937 06/12/14 E.GADDY   RDW 16.6 (*) 11.5 - 15.5 %    Comment: QUESTIONABLE RESULTS, RECOMMEND RECOLLECT TO VERIFY     C.HAYES RN 1696 06/12/14 E.GADDY   Platelets 178  150 - 400 K/uL   Comment: DELTA CHECK NOTED     REPEATED TO VERIFY     SPECIMEN CHECKED FOR CLOTS     QUESTIONABLE RESULTS, RECOMMEND RECOLLECT TO VERIFY     C.HAYES RN 7893 06/12/14 E.GADDY   LUCs, %    0 - 4 %   Value: QUESTIONABLE RESULTS, RECOMMEND RECOLLECT TO VERIFY   Comment: C.HAYES RN 8101 06/12/14 E.GADDY   LUC, Absolute    0.0 - 0.5 K/uL   Value: QUESTIONABLE RESULTS, RECOMMEND RECOLLECT TO VERIFY   Comment: C.HAYES RN 7510 06/12/14 E.GADDY   Other       Value: QUESTIONABLE RESULTS, RECOMMEND RECOLLECT TO VERIFY   Comment: C.HAYES RN 2585 06/12/14 E.GADDY   Other 2       Value: QUESTIONABLE RESULTS, RECOMMEND RECOLLECT TO VERIFY   Comment: C.HAYES RN 2778 06/12/14 E.GADDY   Neutrophils Relative % 86 (*) 43 - 77 %   Comment: QUESTIONABLE RESULTS, RECOMMEND RECOLLECT TO VERIFY     C.HAYES RN 2423 06/12/14 E.GADDY   Neutro Abs 10.5 (*) 1.7 - 7.7 K/uL   Comment: QUESTIONABLE RESULTS, RECOMMEND RECOLLECT TO VERIFY     C.HAYES RN 5361 06/12/14 E.GADDY   Lymphocytes Relative 8 (*) 12 - 46 %   Comment: QUESTIONABLE RESULTS, RECOMMEND RECOLLECT TO VERIFY  C.HAYES RN [phone] 06/12/14 E.GADDY   Lymphs Abs 1.0  0.7 - 4.0 K/uL   Comment: QUESTIONABLE RESULTS, RECOMMEND RECOLLECT TO VERIFY     C.HAYES RN 0932 06/12/14 E.GADDY   Monocytes Relative 3  3 - 12 %   Comment: QUESTIONABLE RESULTS, RECOMMEND RECOLLECT TO VERIFY     C.HAYES RN 3557 06/12/14 E.GADDY   Monocytes Absolute 0.4  0.1 - 1.0 K/uL   Comment: QUESTIONABLE RESULTS, RECOMMEND RECOLLECT TO VERIFY     C.HAYES RN 3220 06/12/14 E.GADDY   Eosinophils Relative 3  0 - 5 %   Comment: QUESTIONABLE RESULTS, RECOMMEND RECOLLECT TO VERIFY     C.HAYES RN 2542 06/12/14 E.GADDY   Eosinophils Absolute 0.3  0.0 - 0.7 K/uL   Comment: QUESTIONABLE RESULTS, RECOMMEND RECOLLECT TO VERIFY     C.HAYES RN 7062 06/12/14 E.GADDY    Basophils Relative 0  0 - 1 %   Comment: QUESTIONABLE RESULTS, RECOMMEND RECOLLECT TO VERIFY     C.HAYES RN 3762 06/12/14 E.GADDY   Basophils Absolute 0.0  0.0 - 0.1 K/uL   Comment: QUESTIONABLE RESULTS, RECOMMEND RECOLLECT TO VERIFY     C.HAYES RN 8315 06/12/14 E.GADDY  GLUCOSE, CAPILLARY     Status: None   Collection Time    06/12/14  7:40 AM      Result Value Ref Range   Glucose-Capillary 85  70 - 99 mg/dL  GLUCOSE, CAPILLARY     Status: None   Collection Time    06/12/14 12:04 PM      Result Value Ref Range   Glucose-Capillary 92  70 - 99 mg/dL  CBC WITH DIFFERENTIAL     Status: Abnormal   Collection Time    06/12/14  3:30 PM      Result Value Ref Range   WBC 7.5  4.0 - 10.5 K/uL   RBC 2.24 (*) 4.22 - 5.81 MIL/uL   Hemoglobin 7.0 (*) 13.0 - 17.0 g/dL   HCT 22.9 (*) 39.0 - 52.0 %   MCV 102.2 (*) 78.0 - 100.0 fL   MCH 31.3  26.0 - 34.0 pg   MCHC 30.6  30.0 - 36.0 g/dL   RDW 17.3 (*) 11.5 - 15.5 %   Platelets 256  150 - 400 K/uL   Comment: PLATELET COUNT CONFIRMED BY SMEAR   Neutrophils Relative % 81 (*) 43 - 77 %   Neutro Abs 6.0  1.7 - 7.7 K/uL   Lymphocytes Relative 11 (*) 12 - 46 %   Lymphs Abs 0.8  0.7 - 4.0 K/uL   Monocytes Relative 7  3 - 12 %   Monocytes Absolute 0.5  0.1 - 1.0 K/uL   Eosinophils Relative 2  0 - 5 %   Eosinophils Absolute 0.1  0.0 - 0.7 K/uL   Basophils Relative 0  0 - 1 %   Basophils Absolute 0.0  0.0 - 0.1 K/uL  COMPREHENSIVE METABOLIC PANEL     Status: Abnormal   Collection Time    06/12/14  3:30 PM      Result Value Ref Range   Sodium 138  137 - 147 mEq/L   Potassium 3.4 (*) 3.7 - 5.3 mEq/L   Comment: DELTA CHECK NOTED   Chloride 103  96 - 112 mEq/L   CO2 23  19 - 32 mEq/L   Glucose, Bld 82  70 - 99 mg/dL   BUN 13  6 - 23 mg/dL   Creatinine, Ser 0.63  0.50 - 1.35 mg/dL  Comment: DELTA CHECK NOTED   Calcium 8.0 (*) 8.4 - 10.5 mg/dL   Total Protein 5.5 (*) 6.0 - 8.3 g/dL   Albumin 1.7 (*) 3.5 - 5.2 g/dL   AST 20  0 - 37 U/L   ALT 12   0 - 53 U/L   Alkaline Phosphatase 109  39 - 117 U/L   Total Bilirubin <0.2 (*) 0.3 - 1.2 mg/dL   GFR calc non Af Amer >90  >90 mL/min   GFR calc Af Amer >90  >90 mL/min   Comment: (NOTE)     The eGFR has been calculated using the CKD EPI equation.     This calculation has not been validated in all clinical situations.     eGFR's persistently <90 mL/min signify possible Chronic Kidney     Disease.   Anion gap 12  5 - 15  GLUCOSE, CAPILLARY     Status: None   Collection Time    06/12/14  4:07 PM      Result Value Ref Range   Glucose-Capillary 85  70 - 99 mg/dL  GLUCOSE, CAPILLARY     Status: Abnormal   Collection Time    06/12/14  7:54 PM      Result Value Ref Range   Glucose-Capillary 68 (*) 70 - 99 mg/dL   Comment 1 Notify RN     Comment 2 Documented in Chart    GLUCOSE, CAPILLARY     Status: Abnormal   Collection Time    06/12/14  8:21 PM      Result Value Ref Range   Glucose-Capillary 123 (*) 70 - 99 mg/dL  GLUCOSE, CAPILLARY     Status: None   Collection Time    06/12/14 10:19 PM      Result Value Ref Range   Glucose-Capillary 70  70 - 99 mg/dL  GLUCOSE, CAPILLARY     Status: Abnormal   Collection Time    06/12/14 10:47 PM      Result Value Ref Range   Glucose-Capillary 132 (*) 70 - 99 mg/dL   Comment 1 Notify RN     Comment 2 Documented in Chart    GLUCOSE, CAPILLARY     Status: None   Collection Time    06/12/14 11:50 PM      Result Value Ref Range   Glucose-Capillary 87  70 - 99 mg/dL   Comment 1 Notify RN     Comment 2 Documented in Chart    GLUCOSE, CAPILLARY     Status: None   Collection Time    06/13/14  1:22 AM      Result Value Ref Range   Glucose-Capillary 76  70 - 99 mg/dL  GLUCOSE, CAPILLARY     Status: None   Collection Time    06/13/14  2:34 AM      Result Value Ref Range   Glucose-Capillary 87  70 - 99 mg/dL  GLUCOSE, CAPILLARY     Status: None   Collection Time    06/13/14  3:57 AM      Result Value Ref Range   Glucose-Capillary 86  70 -  99 mg/dL   Comment 1 Documented in Chart     Comment 2 Notify RN    GLUCOSE, CAPILLARY     Status: None   Collection Time    06/13/14  5:33 AM      Result Value Ref Range   Glucose-Capillary 88  70 - 99 mg/dL   Comment 1  Notify RN     Comment 2 Documented in Chart    CBC     Status: Abnormal   Collection Time    06/13/14  6:04 AM      Result Value Ref Range   WBC 7.5  4.0 - 10.5 K/uL   RBC 2.49 (*) 4.22 - 5.81 MIL/uL   Hemoglobin 7.4 (*) 13.0 - 17.0 g/dL   HCT 25.1 (*) 39.0 - 52.0 %   MCV 100.8 (*) 78.0 - 100.0 fL   MCH 29.7  26.0 - 34.0 pg   MCHC 29.5 (*) 30.0 - 36.0 g/dL   RDW 17.2 (*) 11.5 - 15.5 %   Platelets 305  150 - 400 K/uL  COMPREHENSIVE METABOLIC PANEL     Status: Abnormal   Collection Time    06/13/14  6:04 AM      Result Value Ref Range   Sodium 140  137 - 147 mEq/L   Potassium 3.2 (*) 3.7 - 5.3 mEq/L   Chloride 104  96 - 112 mEq/L   CO2 24  19 - 32 mEq/L   Glucose, Bld 86  70 - 99 mg/dL   BUN 9  6 - 23 mg/dL   Creatinine, Ser 0.64  0.50 - 1.35 mg/dL   Calcium 8.2 (*) 8.4 - 10.5 mg/dL   Total Protein 5.7 (*) 6.0 - 8.3 g/dL   Albumin 1.8 (*) 3.5 - 5.2 g/dL   AST 14  0 - 37 U/L   ALT 11  0 - 53 U/L   Alkaline Phosphatase 108  39 - 117 U/L   Total Bilirubin 0.2 (*) 0.3 - 1.2 mg/dL   GFR calc non Af Amer >90  >90 mL/min   GFR calc Af Amer >90  >90 mL/min   Comment: (NOTE)     The eGFR has been calculated using the CKD EPI equation.     This calculation has not been validated in all clinical situations.     eGFR's persistently <90 mL/min signify possible Chronic Kidney     Disease.   Anion gap 12  5 - 15  GLUCOSE, CAPILLARY     Status: None   Collection Time    06/13/14  7:37 AM      Result Value Ref Range   Glucose-Capillary 96  70 - 99 mg/dL    Dg Chest Port 1 View  06/12/2014   CLINICAL DATA:  Evaluate atelectasis.  EXAM: PORTABLE CHEST - 1 VIEW  COMPARISON:  Chest x-ray 06/11/2014.  FINDINGS: A nasogastric tube is seen extending into the stomach,  however, the tip of the nasogastric tube extends below the lower margin of the image. Lung volumes are low. No consolidative airspace disease. No pleural effusions. Improved aeration at the left base, compatible with resolution of atelectasis noted on the prior examination. No evidence of pulmonary edema. Heart size is normal. Upper mediastinal contours are within normal limits. Multiple old healed right-sided rib fractures are incidentally noted.  IMPRESSION: 1. Support apparatus, as above. 2. Low lung volumes with improving bibasilar aeration, particularly in the left.   Electronically Signed   By: Vinnie Langton M.D.   On: 06/12/2014 08:37    Review of Systems  Constitutional: Positive for weight loss and malaise/fatigue.  HENT: Negative.   Eyes: Negative.   Respiratory: Negative.   Cardiovascular: Negative.   Gastrointestinal:       Recent admission for gastric ulcer  Genitourinary: Negative.   Musculoskeletal: Positive for back pain.  Skin: Negative.  Neurological: Positive for weakness.  Endo/Heme/Allergies:       Anemia with hematocrit of 25  Psychiatric/Behavioral: Positive for depression.   Blood pressure 139/80, pulse 112, temperature 99.4 F (37.4 C), temperature source Oral, resp. rate 18, height 5' 10"  (1.778 m), weight 103.9 kg (229 lb 0.9 oz), SpO2 92.00%. Physical Exam  Constitutional: He is oriented to person, place, and time. He appears well-developed and well-nourished.  Very pale-appearing  HENT:  Head: Normocephalic and atraumatic.  Eyes: Conjunctivae and EOM are normal. Pupils are equal, round, and reactive to light.  Neck: Normal range of motion. Neck supple.  Respiratory: Effort normal.  GI: Soft. Bowel sounds are normal.  Musculoskeletal:  Small area of lower lumbar incision measuring 2 cm in length with centralized breakdown. Animal superficial drainage. No deep drainage. Incision does not appear erythematous nor does the area around the incision appeared  erythematous. there is some dependent edema.  Neurological: He is alert and oriented to person, place, and time.  Motor function appears intact in upper and lower extremities  Skin: Skin is dry.    Assessment/Plan: I have reviewed the patient's CT scan of the pelvis which suggested a sacral screws are loosening. This just 2 months after his surgical intervention. This raises the question of whether there is an infection in the lumbar spine, his last MRI did not suggest this. I will obtain a sedimentation rate and CRP today. Local wound care will be performed on lumbar spine. I appreciate the care that this patient has had from the general surgical service for his gastric ulcer which indeed is related to the patient's admitted use of substantial doses of nonsteroidal anti-inflammatories and aspirin in addition to the rather large doses of narcotic medication he is been requiring. We will see if the lumbar wound is infected and if so it would require extensive debridement and drainage.  Sakina Briones J 06/13/2014, 11:00 AM

## 2014-06-13 NOTE — Progress Notes (Signed)
Agree with above 

## 2014-06-13 NOTE — Progress Notes (Signed)
Patient ID: Devin [name], male   DOB: [birth year], 43 y.o.   MRN: [id number] .     [address] [place], [place] [address]    Phone: [phone] FAX: [phone]     Subjective: Had a BM.  No n/v.  Pain controlled.  VSS.  Afebrile.  Adequate UOP.    Objective:  Vital signs:  Filed Vitals:   06/12/14 0750 06/12/14 1130 06/12/14 2133 06/13/14 0535  BP:  138/85 135/79 139/80  Pulse:  94 92 112  Temp: 98.7 F (37.1 C) 98.9 F (37.2 C) 98.4 F (36.9 C) 99.4 F (37.4 C)  TempSrc: Oral Oral Oral Oral  Resp:  17  18  Height:      Weight:      SpO2:  97% 97% 92%    Last BM Date:  (unknown)  Intake/Output   Yesterday:  09/13 0701 - 09/14 0700 In: 662.5 [I.V.:412.5; IV Piggyback:250] Out: 2680 [Urine:1450; Emesis/NG output:1230] This shift:    I/O last 3 completed shifts: In: 2017.5 [I.V.:1162.5; NG/GT:30; IV Piggyback:825] Out: 9528 [Urine:2255; Emesis/NG output:1230]    Physical Exam: General: Pt awake/alert/oriented x4 in no acute distress Chest: cta.  No chest wall pain w good excursion CV:  Pulses intact.  Regular rhythm MS: Normal AROM mjr joints.  No obvious deformity Abdomen: Soft.  Nondistended.  Appropriately tender.  Midline wound is beefy red, fascia is intact, repacked.  No evidence of peritonitis.  No incarcerated hernias. Ext:  SCDs BLE.  No mjr edema.  No cyanosis Skin: No petechiae / purpura   Problem List:   Active Problems:   Perforated gastric ulcer   Acute respiratory failure, unspecified whether with hypoxia or hypercapnia   Peritonitis (acute) generalized   Pain   AKI (acute kidney injury)   DM type 2 (diabetes mellitus, type 2)    Results:   Labs: Results for orders placed during the hospital encounter of 06/10/14 (from the past 48 hour(s))  POCT I-STAT 3, ART BLOOD GAS (G3+)     Status: Abnormal   Collection Time    06/11/14  9:29 AM      Result Value Ref Range   pH, Arterial 7.457 (*) 7.350 - 7.450   pCO2 arterial 35.2  35.0 - 45.0 mmHg   pO2, Arterial 165.0 (*) 80.0 - 100.0 mmHg   Bicarbonate 24.7 (*) 20.0 - 24.0 mEq/L   TCO2 26  0 - 100 mmol/L   O2 Saturation 100.0     Acid-Base Excess 1.0  0.0 - 2.0 mmol/L   Patient temperature 99.3 F     Collection site ARTERIAL LINE     Drawn by Operator     Sample type ARTERIAL    GLUCOSE, CAPILLARY     Status: None   Collection Time    06/11/14 11:12 AM      Result Value Ref Range   Glucose-Capillary 94  70 - 99 mg/dL  GLUCOSE, CAPILLARY     Status: Abnormal   Collection Time    06/11/14  3:50 PM      Result Value Ref Range   Glucose-Capillary 100 (*) 70 - 99 mg/dL  GLUCOSE, CAPILLARY     Status: None   Collection Time    06/11/14  7:13 PM      Result Value Ref Range   Glucose-Capillary 91  70 - 99 mg/dL  GLUCOSE, CAPILLARY     Status: None  Collection Time    06/11/14 11:38 PM      Result Value Ref Range   Glucose-Capillary 91  70 - 99 mg/dL  GLUCOSE, CAPILLARY     Status: None   Collection Time    06/12/14  3:47 AM      Result Value Ref Range   Glucose-Capillary 94  70 - 99 mg/dL  COMPREHENSIVE METABOLIC PANEL     Status: Abnormal   Collection Time    06/12/14  4:45 AM      Result Value Ref Range   Sodium 148 (*) 137 - 147 mEq/L   Comment: DELTA CHECK NOTED     QUESTIONABLE RESULTS, RECOMMEND RECOLLECT TO VERIFY     C.HAYES RN 9211 06/12/14 E.GADDY   Potassium 4.1  3.7 - 5.3 mEq/L   Comment: QUESTIONABLE RESULTS, RECOMMEND RECOLLECT TO VERIFY     C.HAYES RN 9417 06/12/14 E.GADDY   Chloride 111  96 - 112 mEq/L   Comment: QUESTIONABLE RESULTS, RECOMMEND RECOLLECT TO VERIFY     C.HAYES RN 4081 06/12/14 E.GADDY   CO2 24  19 - 32 mEq/L   Comment: QUESTIONABLE RESULTS, RECOMMEND RECOLLECT TO VERIFY     C.HAYES RN 4481 06/12/14 E.GADDY   Glucose, Bld 104 (*) 70 - 99 mg/dL   Comment: QUESTIONABLE RESULTS, RECOMMEND RECOLLECT TO VERIFY     C.HAYES RN 8563 06/12/14 E.GADDY   BUN 79 (*) 6 -  23 mg/dL   Comment: DELTA CHECK NOTED     QUESTIONABLE RESULTS, RECOMMEND RECOLLECT TO VERIFY     C.HAYES RN 1497 06/12/14 E.GADDY   Creatinine, Ser 3.84 (*) 0.50 - 1.35 mg/dL   Comment: DELTA CHECK NOTED     QUESTIONABLE RESULTS, RECOMMEND RECOLLECT TO VERIFY     C.HAYES RN 0263 06/12/14 E.GADDY   Calcium 9.5  8.4 - 10.5 mg/dL   Comment: QUESTIONABLE RESULTS, RECOMMEND RECOLLECT TO VERIFY     C.HAYES RN 7858 06/12/14 E.GADDY   Total Protein 5.8 (*) 6.0 - 8.3 g/dL   Comment: QUESTIONABLE RESULTS, RECOMMEND RECOLLECT TO VERIFY     C.HAYES RN 8502 06/12/14 E.GADDY   Albumin 1.9 (*) 3.5 - 5.2 g/dL   Comment: QUESTIONABLE RESULTS, RECOMMEND RECOLLECT TO VERIFY     C.HAYES RN 7741 06/12/14 E.GADDY   AST 20  0 - 37 U/L   Comment: QUESTIONABLE RESULTS, RECOMMEND RECOLLECT TO VERIFY     C.HAYES RN 2878 06/12/14 E.GADDY   ALT 15  0 - 53 U/L   Comment: QUESTIONABLE RESULTS, RECOMMEND RECOLLECT TO VERIFY     C.HAYES RN 6767 06/12/14 E.GADDY   Alkaline Phosphatase 90  39 - 117 U/L   Comment: QUESTIONABLE RESULTS, RECOMMEND RECOLLECT TO VERIFY     C.HAYES RN 2094 06/12/14 E.GADDY   Total Bilirubin 0.2 (*) 0.3 - 1.2 mg/dL   Comment: QUESTIONABLE RESULTS, RECOMMEND RECOLLECT TO VERIFY     C.HAYES RN 7096 06/12/14 E.GADDY   GFR calc non Af Amer 18 (*) >90 mL/min   Comment: QUESTIONABLE RESULTS, RECOMMEND RECOLLECT TO VERIFY     C.HAYES RN 2836 06/12/14 E.GADDY   GFR calc Af Amer 20 (*) >90 mL/min   Comment: QUESTIONABLE RESULTS, RECOMMEND RECOLLECT TO VERIFY     C.HAYES RN 6294 06/12/14 E.GADDY     (NOTE)     The eGFR has been calculated using the CKD EPI equation.     This calculation has not been validated in all clinical situations.     eGFR's persistently <90 mL/min signify possible Chronic  Kidney     Disease.   Anion gap 13  5 - 15   Comment: QUESTIONABLE RESULTS, RECOMMEND RECOLLECT TO VERIFY     C.HAYES RN 8127 06/12/14 E.GADDY  CBC WITH DIFFERENTIAL     Status: Abnormal   Collection Time     06/12/14  4:45 AM      Result Value Ref Range   WBC 12.2 (*) 4.0 - 10.5 K/uL   Comment: QUESTIONABLE RESULTS, RECOMMEND RECOLLECT TO VERIFY     C.HAYES RN 5170 06/12/14 E.GADDY   RBC 2.43 (*) 4.22 - 5.81 MIL/uL   Comment: QUESTIONABLE RESULTS, RECOMMEND RECOLLECT TO VERIFY     C.HAYES RN 0174 06/12/14 E.GADDY   Hemoglobin 7.3 (*) 13.0 - 17.0 g/dL   Comment: QUESTIONABLE RESULTS, RECOMMEND RECOLLECT TO VERIFY     C.HAYES RN 9449 06/12/14 E.GADDY   HCT 23.9 (*) 39.0 - 52.0 %   Comment: QUESTIONABLE RESULTS, RECOMMEND RECOLLECT TO VERIFY     C.HAYES RN 6759 06/12/14 E.GADDY   MCV 98.4  78.0 - 100.0 fL   Comment: QUESTIONABLE RESULTS, RECOMMEND RECOLLECT TO VERIFY     C.HAYES RN 1638 06/12/14 E.GADDY   MCH 30.0  26.0 - 34.0 pg   Comment: QUESTIONABLE RESULTS, RECOMMEND RECOLLECT TO VERIFY     C.HAYES RN 4665 06/12/14 E.GADDY   MCHC 30.5  30.0 - 36.0 g/dL   Comment: QUESTIONABLE RESULTS, RECOMMEND RECOLLECT TO VERIFY     C.HAYES RN 9935 06/12/14 E.GADDY   RDW 16.6 (*) 11.5 - 15.5 %   Comment: QUESTIONABLE RESULTS, RECOMMEND RECOLLECT TO VERIFY     C.HAYES RN 7017 06/12/14 E.GADDY   Platelets 178  150 - 400 K/uL   Comment: DELTA CHECK NOTED     REPEATED TO VERIFY     SPECIMEN CHECKED FOR CLOTS     QUESTIONABLE RESULTS, RECOMMEND RECOLLECT TO VERIFY     C.HAYES RN 7939 06/12/14 E.GADDY   LUCs, %    0 - 4 %   Value: QUESTIONABLE RESULTS, RECOMMEND RECOLLECT TO VERIFY   Comment: C.HAYES RN 0300 06/12/14 E.GADDY   LUC, Absolute    0.0 - 0.5 K/uL   Value: QUESTIONABLE RESULTS, RECOMMEND RECOLLECT TO VERIFY   Comment: C.HAYES RN 9233 06/12/14 E.GADDY   Other       Value: QUESTIONABLE RESULTS, RECOMMEND RECOLLECT TO VERIFY   Comment: C.HAYES RN 0076 06/12/14 E.GADDY   Other 2       Value: QUESTIONABLE RESULTS, RECOMMEND RECOLLECT TO VERIFY   Comment: C.HAYES RN 2263 06/12/14 E.GADDY   Neutrophils Relative % 86 (*) 43 - 77 %   Comment: QUESTIONABLE RESULTS, RECOMMEND RECOLLECT TO VERIFY      C.HAYES RN 3354 06/12/14 E.GADDY   Neutro Abs 10.5 (*) 1.7 - 7.7 K/uL   Comment: QUESTIONABLE RESULTS, RECOMMEND RECOLLECT TO VERIFY     C.HAYES RN 5625 06/12/14 E.GADDY   Lymphocytes Relative 8 (*) 12 - 46 %   Comment: QUESTIONABLE RESULTS, RECOMMEND RECOLLECT TO VERIFY     C.HAYES RN 6389 06/12/14 E.GADDY   Lymphs Abs 1.0  0.7 - 4.0 K/uL   Comment: QUESTIONABLE RESULTS, RECOMMEND RECOLLECT TO VERIFY     C.HAYES RN 3734 06/12/14 E.GADDY   Monocytes Relative 3  3 - 12 %   Comment: QUESTIONABLE RESULTS, RECOMMEND RECOLLECT TO VERIFY     C.HAYES RN 2876 06/12/14 E.GADDY   Monocytes Absolute 0.4  0.1 - 1.0 K/uL   Comment: QUESTIONABLE RESULTS, RECOMMEND RECOLLECT TO VERIFY     C.HAYES  RN [phone] 06/12/14 E.GADDY   Eosinophils Relative 3  0 - 5 %   Comment: QUESTIONABLE RESULTS, RECOMMEND RECOLLECT TO VERIFY     C.HAYES RN 6237 06/12/14 E.GADDY   Eosinophils Absolute 0.3  0.0 - 0.7 K/uL   Comment: QUESTIONABLE RESULTS, RECOMMEND RECOLLECT TO VERIFY     C.HAYES RN 6283 06/12/14 E.GADDY   Basophils Relative 0  0 - 1 %   Comment: QUESTIONABLE RESULTS, RECOMMEND RECOLLECT TO VERIFY     C.HAYES RN 1517 06/12/14 E.GADDY   Basophils Absolute 0.0  0.0 - 0.1 K/uL   Comment: QUESTIONABLE RESULTS, RECOMMEND RECOLLECT TO VERIFY     C.HAYES RN 6160 06/12/14 E.GADDY  GLUCOSE, CAPILLARY     Status: None   Collection Time    06/12/14  7:40 AM      Result Value Ref Range   Glucose-Capillary 85  70 - 99 mg/dL  GLUCOSE, CAPILLARY     Status: None   Collection Time    06/12/14 12:04 PM      Result Value Ref Range   Glucose-Capillary 92  70 - 99 mg/dL  CBC WITH DIFFERENTIAL     Status: Abnormal   Collection Time    06/12/14  3:30 PM      Result Value Ref Range   WBC 7.5  4.0 - 10.5 K/uL   RBC 2.24 (*) 4.22 - 5.81 MIL/uL   Hemoglobin 7.0 (*) 13.0 - 17.0 g/dL   HCT 22.9 (*) 39.0 - 52.0 %   MCV 102.2 (*) 78.0 - 100.0 fL   MCH 31.3  26.0 - 34.0 pg   MCHC 30.6  30.0 - 36.0 g/dL   RDW 17.3 (*) 11.5 - 15.5 %    Platelets 256  150 - 400 K/uL   Comment: PLATELET COUNT CONFIRMED BY SMEAR   Neutrophils Relative % 81 (*) 43 - 77 %   Neutro Abs 6.0  1.7 - 7.7 K/uL   Lymphocytes Relative 11 (*) 12 - 46 %   Lymphs Abs 0.8  0.7 - 4.0 K/uL   Monocytes Relative 7  3 - 12 %   Monocytes Absolute 0.5  0.1 - 1.0 K/uL   Eosinophils Relative 2  0 - 5 %   Eosinophils Absolute 0.1  0.0 - 0.7 K/uL   Basophils Relative 0  0 - 1 %   Basophils Absolute 0.0  0.0 - 0.1 K/uL  COMPREHENSIVE METABOLIC PANEL     Status: Abnormal   Collection Time    06/12/14  3:30 PM      Result Value Ref Range   Sodium 138  137 - 147 mEq/L   Potassium 3.4 (*) 3.7 - 5.3 mEq/L   Comment: DELTA CHECK NOTED   Chloride 103  96 - 112 mEq/L   CO2 23  19 - 32 mEq/L   Glucose, Bld 82  70 - 99 mg/dL   BUN 13  6 - 23 mg/dL   Creatinine, Ser 0.63  0.50 - 1.35 mg/dL   Comment: DELTA CHECK NOTED   Calcium 8.0 (*) 8.4 - 10.5 mg/dL   Total Protein 5.5 (*) 6.0 - 8.3 g/dL   Albumin 1.7 (*) 3.5 - 5.2 g/dL   AST 20  0 - 37 U/L   ALT 12  0 - 53 U/L   Alkaline Phosphatase 109  39 - 117 U/L   Total Bilirubin <0.2 (*) 0.3 - 1.2 mg/dL   GFR calc non Af Amer >90  >90 mL/min   GFR calc  Af Amer >90  >90 mL/min   Comment: (NOTE)     The eGFR has been calculated using the CKD EPI equation.     This calculation has not been validated in all clinical situations.     eGFR's persistently <90 mL/min signify possible Chronic Kidney     Disease.   Anion gap 12  5 - 15  GLUCOSE, CAPILLARY     Status: None   Collection Time    06/12/14  4:07 PM      Result Value Ref Range   Glucose-Capillary 85  70 - 99 mg/dL  GLUCOSE, CAPILLARY     Status: Abnormal   Collection Time    06/12/14  7:54 PM      Result Value Ref Range   Glucose-Capillary 68 (*) 70 - 99 mg/dL   Comment 1 Notify RN     Comment 2 Documented in Chart    GLUCOSE, CAPILLARY     Status: Abnormal   Collection Time    06/12/14  8:21 PM      Result Value Ref Range   Glucose-Capillary 123 (*) 70 -  99 mg/dL  GLUCOSE, CAPILLARY     Status: None   Collection Time    06/12/14 10:19 PM      Result Value Ref Range   Glucose-Capillary 70  70 - 99 mg/dL  GLUCOSE, CAPILLARY     Status: Abnormal   Collection Time    06/12/14 10:47 PM      Result Value Ref Range   Glucose-Capillary 132 (*) 70 - 99 mg/dL   Comment 1 Notify RN     Comment 2 Documented in Chart    GLUCOSE, CAPILLARY     Status: None   Collection Time    06/12/14 11:50 PM      Result Value Ref Range   Glucose-Capillary 87  70 - 99 mg/dL   Comment 1 Notify RN     Comment 2 Documented in Chart    GLUCOSE, CAPILLARY     Status: None   Collection Time    06/13/14  1:22 AM      Result Value Ref Range   Glucose-Capillary 76  70 - 99 mg/dL  GLUCOSE, CAPILLARY     Status: None   Collection Time    06/13/14  2:34 AM      Result Value Ref Range   Glucose-Capillary 87  70 - 99 mg/dL  GLUCOSE, CAPILLARY     Status: None   Collection Time    06/13/14  3:57 AM      Result Value Ref Range   Glucose-Capillary 86  70 - 99 mg/dL   Comment 1 Documented in Chart     Comment 2 Notify RN    GLUCOSE, CAPILLARY     Status: None   Collection Time    06/13/14  5:33 AM      Result Value Ref Range   Glucose-Capillary 88  70 - 99 mg/dL   Comment 1 Notify RN     Comment 2 Documented in Chart    CBC     Status: Abnormal   Collection Time    06/13/14  6:04 AM      Result Value Ref Range   WBC 7.5  4.0 - 10.5 K/uL   RBC 2.49 (*) 4.22 - 5.81 MIL/uL   Hemoglobin 7.4 (*) 13.0 - 17.0 g/dL   HCT 25.1 (*) 39.0 - 52.0 %   MCV 100.8 (*) 78.0 - 100.0 fL  MCH 29.7  26.0 - 34.0 pg   MCHC 29.5 (*) 30.0 - 36.0 g/dL   RDW 17.2 (*) 11.5 - 15.5 %   Platelets 305  150 - 400 K/uL  COMPREHENSIVE METABOLIC PANEL     Status: Abnormal   Collection Time    06/13/14  6:04 AM      Result Value Ref Range   Sodium 140  137 - 147 mEq/L   Potassium 3.2 (*) 3.7 - 5.3 mEq/L   Chloride 104  96 - 112 mEq/L   CO2 24  19 - 32 mEq/L   Glucose, Bld 86  70 - 99  mg/dL   BUN 9  6 - 23 mg/dL   Creatinine, Ser 0.64  0.50 - 1.35 mg/dL   Calcium 8.2 (*) 8.4 - 10.5 mg/dL   Total Protein 5.7 (*) 6.0 - 8.3 g/dL   Albumin 1.8 (*) 3.5 - 5.2 g/dL   AST 14  0 - 37 U/L   ALT 11  0 - 53 U/L   Alkaline Phosphatase 108  39 - 117 U/L   Total Bilirubin 0.2 (*) 0.3 - 1.2 mg/dL   GFR calc non Af Amer >90  >90 mL/min   GFR calc Af Amer >90  >90 mL/min   Comment: (NOTE)     The eGFR has been calculated using the CKD EPI equation.     This calculation has not been validated in all clinical situations.     eGFR's persistently <90 mL/min signify possible Chronic Kidney     Disease.   Anion gap 12  5 - 15  GLUCOSE, CAPILLARY     Status: None   Collection Time    06/13/14  7:37 AM      Result Value Ref Range   Glucose-Capillary 96  70 - 99 mg/dL    Imaging / Studies: Dg Chest Port 1 View  06/12/2014   CLINICAL DATA:  Evaluate atelectasis.  EXAM: PORTABLE CHEST - 1 VIEW  COMPARISON:  Chest x-ray 06/11/2014.  FINDINGS: A nasogastric tube is seen extending into the stomach, however, the tip of the nasogastric tube extends below the lower margin of the image. Lung volumes are low. No consolidative airspace disease. No pleural effusions. Improved aeration at the left base, compatible with resolution of atelectasis noted on the prior examination. No evidence of pulmonary edema. Heart size is normal. Upper mediastinal contours are within normal limits. Multiple old healed right-sided rib fractures are incidentally noted.  IMPRESSION: 1. Support apparatus, as above. 2. Low lung volumes with improving bibasilar aeration, particularly in the left.   Electronically Signed   By: Vinnie Langton M.D.   On: 06/12/2014 08:37    Medications / Allergies:  Scheduled Meds: . antiseptic oral rinse  7 mL Mouth Rinse QID  . chlorhexidine  15 mL Mouth Rinse BID  . Chlorhexidine Gluconate Cloth  6 each Topical Q0600  . fluconazole (DIFLUCAN) IV  200 mg Intravenous Q24H  . insulin aspart   0-9 Units Subcutaneous 6 times per day  . mupirocin ointment  1 application Nasal BID  . pantoprazole (PROTONIX) IV  40 mg Intravenous Q12H  . piperacillin-tazobactam (ZOSYN)  IV  3.375 g Intravenous Q8H   Continuous Infusions: . dextrose 5 % and 0.9% NaCl 75 mL/hr at 06/12/14 2212  . fentaNYL infusion INTRAVENOUS Stopped (06/11/14 0823)   PRN Meds:.fentaNYL, hydrALAZINE, HYDROmorphone (DILAUDID) injection, LORazepam, metoprolol, ondansetron (ZOFRAN) IV, ondansetron  Antibiotics: Anti-infectives   Start     Dose/Rate Route  Frequency Ordered Stop   06/11/14 0900  fluconazole (DIFLUCAN) IVPB 200 mg     200 mg 100 mL/hr over 60 Minutes Intravenous Every 24 hours 06/11/14 0832 06/18/14 0859   06/10/14 1900  vancomycin (VANCOCIN) 1,250 mg in sodium chloride 0.9 % 250 mL IVPB  Status:  Discontinued     1,250 mg 166.7 mL/hr over 90 Minutes Intravenous Every 8 hours 06/10/14 0927 06/12/14 0954   06/10/14 1700  piperacillin-tazobactam (ZOSYN) IVPB 3.375 g     3.375 g 12.5 mL/hr over 240 Minutes Intravenous Every 8 hours 06/10/14 0927     06/10/14 1100  vancomycin (VANCOCIN) 1,750 mg in sodium chloride 0.9 % 500 mL IVPB     1,750 mg 250 mL/hr over 120 Minutes Intravenous  Once 06/10/14 0927 06/10/14 1256   06/10/14 1030  piperacillin-tazobactam (ZOSYN) IVPB 3.375 g     3.375 g 100 mL/hr over 30 Minutes Intravenous  Once 06/10/14 0927 06/10/14 1030   06/10/14 0300  piperacillin-tazobactam (ZOSYN) IVPB 3.375 g     3.375 g 12.5 mL/hr over 240 Minutes Intravenous  Once 06/10/14 0249 06/10/14 0444       Assessment/Plan Exploratory laparotomy, repair of perforated pyloric channel ulcer---Dr. Barry Dienes 06/10/14 POD#3 -continue with NGT and bowel rest -will plan to do a UGI on Wednesday to evaluate for a leak -DC foley -pain control/antiemetics -PPI -fluconazole/zosyn 9/11---> -SCD, hold chemical anticoagulation until h&h stabilizes -needs to mobilize -IS -place VAC Diabetes mellitus type  II -q4h CBGs, SSI, D5NS ABL anemia -improved Chronic back pains/p surgery Open back wound -consult to Dr. Ellene Route -BID dry dressing changes Hypokalemia -give [place], ANP-BC [facility] Pager [phone](7A-4:30P) For consults and floor pages call [phone](7A-4:30P)  06/13/2014 7:59 AM

## 2014-06-13 NOTE — Progress Notes (Signed)
ANTIBIOTIC CONSULT NOTE   Pharmacy Consult for zosyn Indication: peritonitis  Allergies  Allergen Reactions  . Codeine Other (See Comments)    Skin "crawl"  . Hydrocodone Other (See Comments)    Skin "crawl"  . Other Nausea Only and Other (See Comments)    Darvocet    Patient Measurements: Height:  (177.8 cm) Weight: 229 lb 0.9 oz (103.9 kg) IBW/kg (Calculated) : 73  Vital Signs: Temp: 99.4 F (37.4 C) (09/14 0535) Temp src: Oral (09/14 0535) BP: 139/80 mmHg (09/14 0535) Pulse Rate: 112 (09/14 0535) Intake/Output from previous day: 09/13 0701 - 09/14 0700 In: 662.5 [I.V.:412.5; IV Piggyback:250] Out: 2680 [Urine:1450; Emesis/NG output:1230] Intake/Output from this shift:    Labs:  Recent Labs  06/12/14 0445 06/12/14 1530 06/13/14 0604  WBC 12.2* 7.5 7.5  HGB 7.3* 7.0* 7.4*  PLT 178 256 305  CREATININE 3.84* 0.63 0.64   Estimated Creatinine Clearance: 140.9 ml/min (by C-G formula based on Cr of 0.64). No results found for this basename: VANCOTROUGH, VANCOPEAK, VANCORANDOM, GENTTROUGH, GENTPEAK, GENTRANDOM, TOBRATROUGH, TOBRAPEAK, TOBRARND, AMIKACINPEAK, AMIKACINTROU, AMIKACIN,  in the last 72 hours   Microbiology: Recent Results (from the past 720 hour(s))  CULTURE, ROUTINE-ABSCESS     Status: None   Collection Time    05/16/14 12:38 PM      Result Value Ref Range Status   Specimen Description ABSCESS BACK   Final   Special Requests NONE   Final   Gram Stain     Final   Value: RARE WBC PRESENT,BOTH PMN AND MONONUCLEAR     NO SQUAMOUS EPITHELIAL CELLS SEEN     NO ORGANISMS SEEN     Performed at [facility]   Culture     Final   Value: NO GROWTH 3 DAYS     Performed at [facility]   Report Status 05/20/2014 FINAL   Final  ANAEROBIC CULTURE     Status: None   Collection Time    05/16/14 12:38 PM      Result Value Ref Range Status   Specimen Description ABSCESS BACK   Final   Special Requests NONE   Final   Gram Stain     Final    Value: RARE WBC PRESENT,BOTH PMN AND MONONUCLEAR     NO SQUAMOUS EPITHELIAL CELLS SEEN     NO ORGANISMS SEEN     Performed at [facility]   Culture     Final   Value: NO ANAEROBES ISOLATED     Performed at [facility]   Report Status 05/21/2014 FINAL   Final  MRSA PCR SCREENING     Status: Abnormal   Collection Time    06/10/14  9:14 AM      Result Value Ref Range Status   MRSA by PCR POSITIVE (*) NEGATIVE Final   Comment:            The GeneXpert MRSA Assay (FDA     approved for NASAL specimens     only), is one component of a     comprehensive MRSA colonization     surveillance program. It is not     intended to diagnose MRSA     infection nor to guide or     monitor treatment for     MRSA infections.     RESULT CALLED TO, READ BACK BY AND VERIFIED WITH:     Ricci Barker RN 11:30 06/10/14 ([email or web address])  URINE CULTURE  Status: None   Collection Time    06/10/14  9:14 AM      Result Value Ref Range Status   Specimen Description URINE, CATHETERIZED   Final   Special Requests Normal   Final   Culture  Setup Time     Final   Value: 06/10/2014 17:14     Performed at [facility] Count     Final   Value: NO GROWTH     Performed at [facility]   Culture     Final   Value: NO GROWTH     Performed at [facility]   Report Status 06/11/2014 FINAL   Final    Medical History: Past Medical History  Diagnosis Date  . Diabetes mellitus without complication     diet contolled  only  . Hypertension   . Anxiety     Medications:  Prescriptions prior to admission  Medication Sig Dispense Refill  . acetaminophen (TYLENOL) 500 MG tablet Take 2,000 mg by mouth every 6 (six) hours as needed for moderate pain or headache.       . lisinopril (PRINIVIL,ZESTRIL) 20 MG tablet Take 1 tablet (20 mg total) by mouth daily.  30 tablet  0  . naproxen (NAPROSYN) 500 MG tablet Take 500 mg by mouth 2 (two) times daily as needed for moderate pain.        Marland Kitchen oxyCODONE (OXY IR/ROXICODONE) 5 MG immediate release tablet Take 1 tablet (5 mg total) by mouth every 4 (four) hours as needed for moderate pain.  60 tablet  0  . OxyCODONE (OXYCONTIN) 15 mg T12A 12 hr tablet Take 1 tablet (15 mg total) by mouth every 12 (twelve) hours.  60 tablet  0  . PARoxetine (PAXIL) 40 MG tablet Take 1 tablet (40 mg total) by mouth every morning.  30 tablet  0   Scheduled:  . antiseptic oral rinse  7 mL Mouth Rinse QID  . chlorhexidine  15 mL Mouth Rinse BID  . Chlorhexidine Gluconate Cloth  6 each Topical Q0600  . fluconazole (DIFLUCAN) IV  200 mg Intravenous Q24H  . insulin aspart  0-9 Units Subcutaneous 6 times per day  . mupirocin ointment  1 application Nasal BID  . pantoprazole (PROTONIX) IV  40 mg Intravenous Q12H  . piperacillin-tazobactam (ZOSYN)  IV  3.375 g Intravenous Q8H  . potassium chloride  10 mEq Intravenous Q1 Hr x 4     Assessment: 43 yo male here with perforated gastric ulcer s/p ex lap.  Patient also with concern for peritonitis to start antibiotics  9/11 vanc>>9/13 9/11 zosyn>> Diflucan  9/12>>7 days  9/11 urine>>ng final  Goal of Therapy:  Eradication of infection  Plan:  -Zosyn 3.375gm IV q8h -Will follow renal function, cultures and clinical progress  Talbert Cage, Pharm D 06/13/2014 9:11 AM

## 2014-06-14 ENCOUNTER — Inpatient Hospital Stay (HOSPITAL_COMMUNITY): Payer: BC Managed Care – PPO

## 2014-06-14 LAB — CBC
HCT: 26.6 % — ABNORMAL LOW (ref 39.0–52.0)
HEMOGLOBIN: 7.9 g/dL — AB (ref 13.0–17.0)
MCH: 29.6 pg (ref 26.0–34.0)
MCHC: 29.7 g/dL — AB (ref 30.0–36.0)
MCV: 99.6 fL (ref 78.0–100.0)
Platelets: 296 10*3/uL (ref 150–400)
RBC: 2.67 MIL/uL — ABNORMAL LOW (ref 4.22–5.81)
RDW: 17.4 % — ABNORMAL HIGH (ref 11.5–15.5)
WBC: 7.1 10*3/uL (ref 4.0–10.5)

## 2014-06-14 LAB — TYPE AND SCREEN
ABO/RH(D): O POS
ANTIBODY SCREEN: NEGATIVE
Unit division: 0
Unit division: 0

## 2014-06-14 LAB — GLUCOSE, CAPILLARY
GLUCOSE-CAPILLARY: 80 mg/dL (ref 70–99)
GLUCOSE-CAPILLARY: 82 mg/dL (ref 70–99)
Glucose-Capillary: 73 mg/dL (ref 70–99)
Glucose-Capillary: 83 mg/dL (ref 70–99)
Glucose-Capillary: 84 mg/dL (ref 70–99)
Glucose-Capillary: 94 mg/dL (ref 70–99)
Glucose-Capillary: 96 mg/dL (ref 70–99)

## 2014-06-14 LAB — BASIC METABOLIC PANEL
Anion gap: 12 (ref 5–15)
BUN: 6 mg/dL (ref 6–23)
CHLORIDE: 104 meq/L (ref 96–112)
CO2: 25 meq/L (ref 19–32)
Calcium: 8 mg/dL — ABNORMAL LOW (ref 8.4–10.5)
Creatinine, Ser: 0.6 mg/dL (ref 0.50–1.35)
GFR calc Af Amer: >90 mL/min (ref >90)
GLUCOSE: 89 mg/dL (ref 70–99)
POTASSIUM: 3 meq/L — AB (ref 3.7–5.3)
SODIUM: 141 meq/L (ref 137–147)

## 2014-06-14 LAB — MAGNESIUM: Magnesium: 2.1 mg/dL (ref 1.5–2.5)

## 2014-06-14 MED ORDER — POTASSIUM CHLORIDE 10 MEQ/100ML IV SOLN
10.0000 meq | INTRAVENOUS | Status: AC
  Administered 2014-06-14 (×2): 10 meq via INTRAVENOUS
  Filled 2014-06-14 (×2): qty 100

## 2014-06-14 MED ORDER — IOHEXOL 300 MG/ML  SOLN
150.0000 mL | Freq: Once | INTRAMUSCULAR | Status: AC | PRN
  Administered 2014-06-14: 150 mL via ORAL

## 2014-06-14 MED ORDER — POTASSIUM CHLORIDE 10 MEQ/100ML IV SOLN
10.0000 meq | INTRAVENOUS | Status: AC
  Administered 2014-06-14 (×4): 10 meq via INTRAVENOUS
  Filled 2014-06-14 (×4): qty 100

## 2014-06-14 MED ORDER — ENOXAPARIN SODIUM 40 MG/0.4ML ~~LOC~~ SOLN
40.0000 mg | SUBCUTANEOUS | Status: DC
  Administered 2014-06-14 – 2014-06-15 (×2): 40 mg via SUBCUTANEOUS
  Filled 2014-06-14 (×3): qty 0.4

## 2014-06-14 NOTE — Progress Notes (Signed)
Start lovenox today, check ugi, ng output high but is nonbilious now.

## 2014-06-14 NOTE — Progress Notes (Signed)
Patient ID: Devin [name], male   DOB: [birth year], 43 y.o.   MRN: [id number] Reviewed CTs with Dr. Constance Goltz of radiology. Persistent concern for possibility of infection.. Patient has been on zosyn and diflucan. Sed rate 115 and CRP is 6.  Today after sterile prep with betadine swabs x 3 inserted 18 gauge spinal needle in subcutaneous space in lumbar region. Aspirated 110 cc of purulent fluid. Sent for culture.   He will need formal surgical debridement and probable hardware revision.  Will coordinate surgery with General surgery.

## 2014-06-14 NOTE — Progress Notes (Signed)
NUTRITION FOLLOW-UP  DOCUMENTATION CODES Per approved criteria  -Obesity Unspecified   INTERVENTION:  If unable to advance diet and able to use gut for nutrition, recommend initiate TF via OGT with Vital  at 25 ml/h and Prostat 30 ml BID;  increase  by 10ml every 4 hours until goal rate is reached at 75 ml/h (1800 ml per day) to provide 1800 kcals (24 kcals/kg ideal weight), 158 gm protein, 1505 ml free water daily.  If expected to need prolonged bowel rest >/= 7-10 days, consider TPN.   Will continue to monitor  NUTRITION DIAGNOSIS: Inadequate oral intake related to inability to eat with altered GI function as evidenced by NPO status, ongoing.  Goal: Pt to meet >/= 90% of their estimated nutrition needs, unmet   Monitor:  Bowel rest, TPN vs. TF vs. Diet advancement, weight trend, labs, vent status.  Admitting Dx: Perforated gastric ulcer  ASSESSMENT: 43 yo M who underwent lumbar fusion earlier in the summer and had several evacuations of hematomas. Went to [facility] on 9/11, then transferred to [facility]. In ED, CT demonstrated free air and a perforated ulcer.  [name] with repair of perforated pyloric channel ulcer on 9/11.  Pt was extubated 9/12  Pt is NPO. Per NP, bowel rest is to continue today.   Labs reviewed: Low K  Height: Ht Readings from Last 1 Encounters:  06/10/14  (1.778 m)    Weight: Wt Readings from Last 1 Encounters:  06/12/14 229 lb 0.9 oz (103.9 kg)    Ideal Body Weight: 75.5 kg  % Ideal Body Weight: 131%   BMI:  Body mass index is 32.87 kg/(m^2). class 1 obesity  Re-estimated Nutritional Needs: Kcal: 1900-2000 Protein: >/= 150 gm Fluid: 1.9 L/day  Skin: surgical abdominal incision, lumbar spine wound  Diet Order: NPO  EDUCATION NEEDS: -Education not appropriate at this time   Intake/Output Summary (Last 24 hours) at 06/14/14 1151 Last data filed at 06/14/14 0829  Gross per 24 hour  Intake    400 ml   Output   2350 ml  Net  -1950 ml    Last BM: 9/9   Labs:   Recent Labs Lab 06/12/14 1530 06/13/14 0604 06/14/14 0403  NA 138 140 141  K 3.4* 3.2* 3.0*  CL 103 104 104  CO2 BUN CREATININE 0.63 0.64 0.60  CALCIUM 8.0* 8.2* 8.0*  MG  --   --  2.1  GLUCOSE 82 86 89    CBG (last 3)   Recent Labs  06/13/14 2349 06/14/14 0403 06/14/14 0801  GLUCAP 80 96 83    Scheduled Meds: . antiseptic oral rinse  7 mL Mouth Rinse QID  . chlorhexidine  15 mL Mouth Rinse BID  . Chlorhexidine Gluconate Cloth  6 each Topical Q0600  . fluconazole (DIFLUCAN) IV  200 mg Intravenous Q24H  . insulin aspart  0-9 Units Subcutaneous 6 times per day  . mupirocin ointment  1 application Nasal BID  . pantoprazole (PROTONIX) IV  40 mg Intravenous Q12H  . piperacillin-tazobactam (ZOSYN)  IV  3.375 g Intravenous Q8H  . potassium chloride  10 mEq Intravenous Q1 Hr x 6    Continuous Infusions: . dextrose 5 % and 0.9% NaCl 75 mL/hr at 06/14/14 0450    Past Medical History  Diagnosis Date  . Diabetes mellitus without complication     diet contolled  only  . Hypertension   . Anxiety  Past Surgical History  Procedure Laterality Date  . Back surgery      x2 surgeries  . Nasal dilation    . Rotator cuff repair    . Posterior lumbar fusion 4 level N/A 04/19/2014    Procedure: LUMBAR TWO-THREE,LUMBAR THREE-FOUR,LUMBAR FOUR-FIVE,LUMBAR FIVE-SACRAL-ONE POSTERIOR LUMBAR INTERBODY FUSION/ADD ON TO THORACIC TEN-LUMBAT TWO FUSION;  Surgeon: Barnett Abu, MD;  Location: [facility] NEURO ORS;  Service: Neurosurgery;  Laterality: N/A;  . Wound exploration N/A 05/11/2014    Procedure: Lumbar WOUND EXPLORATION, evcuation of hematoma;  Surgeon: Temple Pacini, MD;  Location: [facility] NEURO ORS;  Service: Neurosurgery;  Laterality: N/A;  . Lumbar laminectomy/decompression microdiscectomy N/A 05/16/2014    Procedure: Repeat evacuation of lumbar hematoma;  Surgeon: Barnett Abu, MD;  Location: [facility]S;   Service: Neurosurgery;  Laterality: N/A;  Repeat evacuation of lumbar hematoma  . Repair of perforated ulcer  06/10/2014  . Laparotomy N/A 06/10/2014    Procedure: EXPLORATORY LAPAROTOMY;  Surgeon: Almond Lint, MD;  Location: [facility] OR;  Service: General;  Laterality: N/A;  patch repair of perforated pyloric ulcer    Tilda Franco, MS, PLDN Provisionally Licensed Dietitian Nutritionist Pager: [phone]

## 2014-06-14 NOTE — Progress Notes (Signed)
Chart and note reviewed. Agree with note.  Kimberly Harris, RD, LDN, CNSC Pager [phone] After Hours Pager [phone]   

## 2014-06-14 NOTE — Progress Notes (Signed)
Patient ID: Devin [name], male   DOB: 02-Mar-1971, 43 y.o.   MRN: [id number]     [place]      [address] [place], [place] [address]    Phone: [phone] FAX: [phone]     Subjective: VSS.  Afebrile.  Mobile.  H&h improved. Had a BM, some nausea, no vomiting.   Objective:  Vital signs:  Filed Vitals:   06/13/14 0535 06/13/14 1321 06/13/14 2108 06/14/14 0622  BP: 139/80 147/77 152/82 155/89  Pulse: 112 105 93 89  Temp: 99.4 F (37.4 C) 98.1 F (36.7 C) 98 F (36.7 C) 97.9 F (36.6 C)  TempSrc: Oral Oral Oral Oral  Resp: _0 Height:      Weight:      SpO2: 92% 99% 98% 100%    Last BM Date: 06/13/14  Intake/Output   Yesterday:  09/14 0701 - 09/15 0700 In: 460 [P.O.:260; IV Piggyback:200] Out: 2150 [Urine:550; Emesis/NG output:1600] This shift:  Total I/O In: -  Out: 200 [Urine:200]  Physical Exam:  General: Pt awake/alert/oriented x4 in no acute distress  Chest: cta. No chest wall pain w good excursion  CV: Pulses intact. Regular rhythm  Abdomen: Soft. Nondistended. Appropriately tender. Midline wound-VAC in place.  No evidence of peritonitis. No incarcerated hernias.  Ext: SCDs BLE. No mjr edema. No cyanosis  Skin: low back wound approx 1cm opening without surrounding erythema or area of fluctuance, moderate amount of purulent drainage.    Problem List:   Active Problems:   Perforated gastric ulcer   Acute respiratory failure, unspecified whether with hypoxia or hypercapnia   Peritonitis (acute) generalized   Pain   AKI (acute kidney injury)   DM type 2 (diabetes mellitus, type 2)    Results:   Labs: Results for orders placed during the hospital encounter of 06/10/14 (from the past 48 hour(s))  GLUCOSE, CAPILLARY     Status: None   Collection Time    06/12/14 12:04 PM      Result Value Ref Range   Glucose-Capillary 92  70 - 99 mg/dL  CBC WITH DIFFERENTIAL     Status: Abnormal    Collection Time    06/12/14  3:30 PM      Result Value Ref Range   WBC 7.5  4.0 - 10.5 K/uL   RBC 2.24 (*) 4.22 - 5.81 MIL/uL   Hemoglobin 7.0 (*) 13.0 - 17.0 g/dL   HCT 22.9 (*) 39.0 - 52.0 %   MCV 102.2 (*) 78.0 - 100.0 fL   MCH 31.3  26.0 - 34.0 pg   MCHC 30.6  30.0 - 36.0 g/dL   RDW 17.3 (*) 11.5 - 15.5 %   Platelets 256  150 - 400 K/uL   Comment: PLATELET COUNT CONFIRMED BY SMEAR   Neutrophils Relative % 81 (*) 43 - 77 %   Neutro Abs 6.0  1.7 - 7.7 K/uL   Lymphocytes Relative 11 (*) 12 - 46 %   Lymphs Abs 0.8  0.7 - 4.0 K/uL   Monocytes Relative 7  3 - 12 %   Monocytes Absolute 0.5  0.1 - 1.0 K/uL   Eosinophils Relative 2  0 - 5 %   Eosinophils Absolute 0.1  0.0 - 0.7 K/uL   Basophils Relative 0  0 - 1 %   Basophils Absolute 0.0  0.0 - 0.1 K/uL  COMPREHENSIVE METABOLIC PANEL     Status: Abnormal   Collection  Time    06/12/14  3:30 PM      Result Value Ref Range   Sodium 138  137 - 147 mEq/L   Potassium 3.4 (*) 3.7 - 5.3 mEq/L   Comment: DELTA CHECK NOTED   Chloride 103  96 - 112 mEq/L   CO2 23  19 - 32 mEq/L   Glucose, Bld 82  70 - 99 mg/dL   BUN 13  6 - 23 mg/dL   Creatinine, Ser 0.63  0.50 - 1.35 mg/dL   Comment: DELTA CHECK NOTED   Calcium 8.0 (*) 8.4 - 10.5 mg/dL   Total Protein 5.5 (*) 6.0 - 8.3 g/dL   Albumin 1.7 (*) 3.5 - 5.2 g/dL   AST 20  0 - 37 U/L   ALT 12  0 - 53 U/L   Alkaline Phosphatase 109  39 - 117 U/L   Total Bilirubin <0.2 (*) 0.3 - 1.2 mg/dL   GFR calc non Af Amer >90  >90 mL/min   GFR calc Af Amer >90  >90 mL/min   Comment: (NOTE)     The eGFR has been calculated using the CKD EPI equation.     This calculation has not been validated in all clinical situations.     eGFR's persistently <90 mL/min signify possible Chronic Kidney     Disease.   Anion gap 12  5 - 15  GLUCOSE, CAPILLARY     Status: None   Collection Time    06/12/14  4:07 PM      Result Value Ref Range   Glucose-Capillary 85  70 - 99 mg/dL  GLUCOSE, CAPILLARY     Status:  Abnormal   Collection Time    06/12/14  7:54 PM      Result Value Ref Range   Glucose-Capillary 68 (*) 70 - 99 mg/dL   Comment 1 Notify RN     Comment 2 Documented in Chart    GLUCOSE, CAPILLARY     Status: Abnormal   Collection Time    06/12/14  8:21 PM      Result Value Ref Range   Glucose-Capillary 123 (*) 70 - 99 mg/dL  GLUCOSE, CAPILLARY     Status: None   Collection Time    06/12/14 10:19 PM      Result Value Ref Range   Glucose-Capillary 70  70 - 99 mg/dL  GLUCOSE, CAPILLARY     Status: Abnormal   Collection Time    06/12/14 10:47 PM      Result Value Ref Range   Glucose-Capillary 132 (*) 70 - 99 mg/dL   Comment 1 Notify RN     Comment 2 Documented in Chart    GLUCOSE, CAPILLARY     Status: None   Collection Time    06/12/14 11:50 PM      Result Value Ref Range   Glucose-Capillary 87  70 - 99 mg/dL   Comment 1 Notify RN     Comment 2 Documented in Chart    GLUCOSE, CAPILLARY     Status: None   Collection Time    06/13/14  1:22 AM      Result Value Ref Range   Glucose-Capillary 76  70 - 99 mg/dL  GLUCOSE, CAPILLARY     Status: None   Collection Time    06/13/14  2:34 AM      Result Value Ref Range   Glucose-Capillary 87  70 - 99 mg/dL  GLUCOSE, CAPILLARY     Status:  None   Collection Time    06/13/14  3:57 AM      Result Value Ref Range   Glucose-Capillary 86  70 - 99 mg/dL   Comment 1 Documented in Chart     Comment 2 Notify RN    GLUCOSE, CAPILLARY     Status: None   Collection Time    06/13/14  5:33 AM      Result Value Ref Range   Glucose-Capillary 88  70 - 99 mg/dL   Comment 1 Notify RN     Comment 2 Documented in Chart    CBC     Status: Abnormal   Collection Time    06/13/14  6:04 AM      Result Value Ref Range   WBC 7.5  4.0 - 10.5 K/uL   RBC 2.49 (*) 4.22 - 5.81 MIL/uL   Hemoglobin 7.4 (*) 13.0 - 17.0 g/dL   HCT 25.1 (*) 39.0 - 52.0 %   MCV 100.8 (*) 78.0 - 100.0 fL   MCH 29.7  26.0 - 34.0 pg   MCHC 29.5 (*) 30.0 - 36.0 g/dL   RDW 17.2  (*) 11.5 - 15.5 %   Platelets 305  150 - 400 K/uL  COMPREHENSIVE METABOLIC PANEL     Status: Abnormal   Collection Time    06/13/14  6:04 AM      Result Value Ref Range   Sodium 140  137 - 147 mEq/L   Potassium 3.2 (*) 3.7 - 5.3 mEq/L   Chloride 104  96 - 112 mEq/L   CO2 24  19 - 32 mEq/L   Glucose, Bld 86  70 - 99 mg/dL   BUN 9  6 - 23 mg/dL   Creatinine, Ser 0.64  0.50 - 1.35 mg/dL   Calcium 8.2 (*) 8.4 - 10.5 mg/dL   Total Protein 5.7 (*) 6.0 - 8.3 g/dL   Albumin 1.8 (*) 3.5 - 5.2 g/dL   AST 14  0 - 37 U/L   ALT 11  0 - 53 U/L   Alkaline Phosphatase 108  39 - 117 U/L   Total Bilirubin 0.2 (*) 0.3 - 1.2 mg/dL   GFR calc non Af Amer >90  >90 mL/min   GFR calc Af Amer >90  >90 mL/min   Comment: (NOTE)     The eGFR has been calculated using the CKD EPI equation.     This calculation has not been validated in all clinical situations.     eGFR's persistently <90 mL/min signify possible Chronic Kidney     Disease.   Anion gap 12  5 - 15  GLUCOSE, CAPILLARY     Status: None   Collection Time    06/13/14  7:37 AM      Result Value Ref Range   Glucose-Capillary 96  70 - 99 mg/dL  C-REACTIVE PROTEIN     Status: Abnormal   Collection Time    06/13/14 11:30 AM      Result Value Ref Range   CRP 22.0 (*) <0.60 mg/dL   Comment: Performed at [facility]     Status: Abnormal   Collection Time    06/13/14 11:30 AM      Result Value Ref Range   Sed Rate 115 (*) 0 - 16 mm/hr  GLUCOSE, CAPILLARY     Status: None   Collection Time    06/13/14 11:36 AM      Result Value Ref Range  Glucose-Capillary 94  70 - 99 mg/dL  URINALYSIS, ROUTINE W REFLEX MICROSCOPIC     Status: Abnormal   Collection Time    06/13/14  2:34 PM      Result Value Ref Range   Color, Urine YELLOW  YELLOW   APPearance TURBID (*) CLEAR   Specific Gravity, Urine 1.022  1.005 - 1.030   pH 6.0  5.0 - 8.0   Glucose, UA NEGATIVE  NEGATIVE mg/dL   Hgb urine dipstick MODERATE (*) NEGATIVE    Bilirubin Urine NEGATIVE  NEGATIVE   Ketones, ur 15 (*) NEGATIVE mg/dL   Protein, ur 30 (*) NEGATIVE mg/dL   Urobilinogen, UA 0.2  0.0 - 1.0 mg/dL   Nitrite NEGATIVE  NEGATIVE   Leukocytes, UA NEGATIVE  NEGATIVE  URINE MICROSCOPIC-ADD ON     Status: Abnormal   Collection Time    06/13/14  2:34 PM      Result Value Ref Range   Squamous Epithelial / LPF RARE  RARE   WBC, UA 0-2  <3 WBC/hpf   RBC / HPF 0-2  <3 RBC/hpf   Bacteria, UA FEW (*) RARE   Casts HYALINE CASTS (*) NEGATIVE   Crystals CA OXALATE CRYSTALS (*) NEGATIVE  GLUCOSE, CAPILLARY     Status: None   Collection Time    06/13/14  3:51 PM      Result Value Ref Range   Glucose-Capillary 85  70 - 99 mg/dL  GLUCOSE, CAPILLARY     Status: None   Collection Time    06/13/14  8:05 PM      Result Value Ref Range   Glucose-Capillary 80  70 - 99 mg/dL   Comment 1 Notify RN     Comment 2 Documented in Chart    GLUCOSE, CAPILLARY     Status: None   Collection Time    06/13/14 11:49 PM      Result Value Ref Range   Glucose-Capillary 80  70 - 99 mg/dL   Comment 1 Notify RN     Comment 2 Documented in Chart    CBC     Status: Abnormal   Collection Time    06/14/14  4:03 AM      Result Value Ref Range   WBC 7.1  4.0 - 10.5 K/uL   RBC 2.67 (*) 4.22 - 5.81 MIL/uL   Hemoglobin 7.9 (*) 13.0 - 17.0 g/dL   HCT 26.6 (*) 39.0 - 52.0 %   MCV 99.6  78.0 - 100.0 fL   MCH 29.6  26.0 - 34.0 pg   MCHC 29.7 (*) 30.0 - 36.0 g/dL   RDW 17.4 (*) 11.5 - 15.5 %   Platelets 296  150 - 400 K/uL  BASIC METABOLIC PANEL     Status: Abnormal   Collection Time    06/14/14  4:03 AM      Result Value Ref Range   Sodium 141  137 - 147 mEq/L   Potassium 3.0 (*) 3.7 - 5.3 mEq/L   Chloride 104  96 - 112 mEq/L   CO2 25  19 - 32 mEq/L   Glucose, Bld 89  70 - 99 mg/dL   BUN 6  6 - 23 mg/dL   Creatinine, Ser 0.60  0.50 - 1.35 mg/dL   Calcium 8.0 (*) 8.4 - 10.5 mg/dL   GFR calc non Af Amer >90  >90 mL/min   GFR calc Af Amer >90  >90 mL/min    Comment: (NOTE)  The eGFR has been calculated using the CKD EPI equation.     This calculation has not been validated in all clinical situations.     eGFR's persistently <90 mL/min signify possible Chronic Kidney     Disease.   Anion gap 12  5 - 15  MAGNESIUM     Status: None   Collection Time    06/14/14  4:03 AM      Result Value Ref Range   Magnesium 2.1  1.5 - 2.5 mg/dL  GLUCOSE, CAPILLARY     Status: None   Collection Time    06/14/14  4:03 AM      Result Value Ref Range   Glucose-Capillary 96  70 - 99 mg/dL   Comment 1 Notify RN     Comment 2 Documented in Chart    GLUCOSE, CAPILLARY     Status: None   Collection Time    06/14/14  8:01 AM      Result Value Ref Range   Glucose-Capillary 83  70 - 99 mg/dL    Imaging / Studies: No results found.  Medications / Allergies:  Scheduled Meds: . antiseptic oral rinse  7 mL Mouth Rinse QID  . chlorhexidine  15 mL Mouth Rinse BID  . Chlorhexidine Gluconate Cloth  6 each Topical Q0600  . fluconazole (DIFLUCAN) IV  200 mg Intravenous Q24H  . insulin aspart  0-9 Units Subcutaneous 6 times per day  . mupirocin ointment  1 application Nasal BID  . pantoprazole (PROTONIX) IV  40 mg Intravenous Q12H  . piperacillin-tazobactam (ZOSYN)  IV  3.375 g Intravenous Q8H  . potassium chloride  10 mEq Intravenous Q1 Hr x 6   Continuous Infusions: . dextrose 5 % and 0.9% NaCl 75 mL/hr at 06/14/14 0450   PRN Meds:.hydrALAZINE, HYDROmorphone (DILAUDID) injection, LORazepam, metoprolol, ondansetron (ZOFRAN) IV, ondansetron  Antibiotics: Anti-infectives   Start     Dose/Rate Route Frequency Ordered Stop   06/11/14 0900  fluconazole (DIFLUCAN) IVPB 200 mg     200 mg 100 mL/hr over 60 Minutes Intravenous Every 24 hours 06/11/14 0832 06/18/14 0859   06/10/14 1900  vancomycin (VANCOCIN) 1,250 mg in sodium chloride 0.9 % 250 mL IVPB  Status:  Discontinued     1,250 mg 166.7 mL/hr over 90 Minutes Intravenous Every 8 hours 06/10/14 0927  06/12/14 0954   06/10/14 1700  piperacillin-tazobactam (ZOSYN) IVPB 3.375 g     3.375 g 12.5 mL/hr over 240 Minutes Intravenous Every 8 hours 06/10/14 0927     06/10/14 1100  vancomycin (VANCOCIN) 1,750 mg in sodium chloride 0.9 % 500 mL IVPB     1,750 mg 250 mL/hr over 120 Minutes Intravenous  Once 06/10/14 0927 06/10/14 1256   06/10/14 1030  piperacillin-tazobactam (ZOSYN) IVPB 3.375 g     3.375 g 100 mL/hr over 30 Minutes Intravenous  Once 06/10/14 0927 06/10/14 1030   06/10/14 0300  piperacillin-tazobactam (ZOSYN) IVPB 3.375 g     3.375 g 12.5 mL/hr over 240 Minutes Intravenous  Once 06/10/14 0249 06/10/14 0444      Assessment/Plan  Exploratory laparotomy, repair of perforated pyloric channel ulcer---Dr. Barry Dienes 06/10/14  POD#4  -continue with NGT and bowel rest  -UGI today -pain control/antiemetics  -PPI  -fluconazole/zosyn D#4/7 -SCD, hold chemical anticoagulation until h&h stabilizes  -needs to mobilize  -IS  -VAC M-W-F Diabetes mellitus type II  -q4h CBGs, SSI, D5NS, stable ABL anemia  -improved  Chronic back pains/p surgery  Open back wound  -Dr.  Elsner following, appreciate assistance -BID dry dressing changes  Hypokalemia  -give 60 KCL IV, add Mg level -repeat labs in AM Dispo-lives alone, no family, has neighbors.  DC with VAC, CM on board for insurance approval. DC if UGI is negative and tolerating PO, anticipate 2-3 days.   Erby Pian, [facility] Pager [phone]) For consults and floor pages call [phone](7A-4:30P)  06/14/2014 9:29 AM

## 2014-06-15 LAB — GLUCOSE, CAPILLARY
GLUCOSE-CAPILLARY: 89 mg/dL (ref 70–99)
GLUCOSE-CAPILLARY: 87 mg/dL (ref 70–99)
GLUCOSE-CAPILLARY: 69 mg/dL — AB (ref 70–99)
GLUCOSE-CAPILLARY: 87 mg/dL (ref 70–99)
GLUCOSE-CAPILLARY: 94 mg/dL (ref 70–99)
Glucose-Capillary: 88 mg/dL (ref 70–99)

## 2014-06-15 LAB — BASIC METABOLIC PANEL
Anion gap: 14 (ref 5–15)
BUN: 4 mg/dL — ABNORMAL LOW (ref 6–23)
CO2: 24 mEq/L (ref 19–32)
Calcium: 8.1 mg/dL — ABNORMAL LOW (ref 8.4–10.5)
Chloride: 104 mEq/L (ref 96–112)
Creatinine, Ser: 0.62 mg/dL (ref 0.50–1.35)
Glucose, Bld: 87 mg/dL (ref 70–99)
POTASSIUM: 3.3 meq/L — AB (ref 3.7–5.3)
Sodium: 142 mEq/L (ref 137–147)

## 2014-06-15 LAB — CBC
HCT: 23.6 % — ABNORMAL LOW (ref 39.0–52.0)
HEMOGLOBIN: 7.1 g/dL — AB (ref 13.0–17.0)
MCH: 29.7 pg (ref 26.0–34.0)
MCHC: 30.1 g/dL (ref 30.0–36.0)
MCV: 98.7 fL (ref 78.0–100.0)
Platelets: 389 10*3/uL (ref 150–400)
RBC: 2.39 MIL/uL — ABNORMAL LOW (ref 4.22–5.81)
RDW: 17.6 % — AB (ref 11.5–15.5)
WBC: 6.9 10*3/uL (ref 4.0–10.5)

## 2014-06-15 MED ORDER — POTASSIUM CHLORIDE 10 MEQ/100ML IV SOLN
10.0000 meq | INTRAVENOUS | Status: AC
  Administered 2014-06-15 (×4): 10 meq via INTRAVENOUS
  Filled 2014-06-15 (×3): qty 100

## 2014-06-15 NOTE — Progress Notes (Signed)
Agree with above 

## 2014-06-15 NOTE — Progress Notes (Signed)
Patient Devin [name] 73, patient is asymtomatic, alert and oriented. Md on call made ware and patient to start on clear liquids and will monitor Devin [name].Orange juice given.

## 2014-06-15 NOTE — Progress Notes (Signed)
Patient ID: Devin [name], male   DOB: September 09, 1971, 43 y.o.   MRN: [id number]     [address] [place], [place] [address]    Phone: [phone] FAX: [phone]     Subjective: Walking.  No n/v.  422m out.  Had a BM today.    Objective:  Vital signs:  Filed Vitals:   06/14/14 0622 06/14/14 1320 06/14/14 2057 06/15/14 0434  BP: 155/89 148/80 159/88 152/91  Pulse: 89 92 88 89  Temp: 97.9 F (36.6 C) 98.1 F (36.7 C) 97.8 F (36.6 C) 98.5 F (36.9 C)  TempSrc: Oral Oral Oral Oral  Resp: 16 16 18 20   Height:      Weight:      SpO2: 100% 100% 98% 97%    Last BM Date: 06/13/14  Intake/Output   Yesterday:  09/15 0701 - 09/16 0700 In: 900 [I.V.:900] Out: 1275 [Urine:775; Emesis/NG output:450; Drains:50] This shift:    I/O last 3 completed shifts: In: 900 [I.V.:900] Out: 2150 [Urine:900; Emesis/NG output:1200; Drains:50]     Physical Exam:  General: Pt awake/alert/oriented x4 in no acute distress  Chest: cta. No chest wall pain w good excursion  CV: Pulses intact. Regular rhythm  Abdomen: Soft. Nondistended. Appropriately tender. Midline wound-VAC in place. No evidence of peritonitis. No incarcerated hernias.  Ext: SCDs BLE. No mjr edema. No cyanosis  Skin: low back wound dressing is c/d/i.    Problem List:   Active Problems:   Perforated gastric ulcer   Acute respiratory failure, unspecified whether with hypoxia or hypercapnia   Peritonitis (acute) generalized   Pain   AKI (acute kidney injury)   DM type 2 (diabetes mellitus, type 2)    Results:   Labs: Results for orders placed during the hospital encounter of 06/10/14 (from the past 48 hour(s))  C-REACTIVE PROTEIN     Status: Abnormal   Collection Time    06/13/14 11:30 AM      Result Value Ref Range   CRP 22.0 (*) <0.60 mg/dL   Comment[facility]    Status: Abnormal   Collection Time   06/13/14 11:30 AM      Result Value Ref Range   Sed Rate 115 (*) 0 - 16 mm/hr  GLUCOSE, CAPILLARY     Status: None   Collection Time    06/13/14 11:36 AM      Result Value Ref Range   Glucose-Capillary 94  70 - 99 mg/dL  URINALYSIS, ROUTINE W REFLEX MICROSCOPIC     Status: Abnormal   Collection Time    06/13/14  2:34 PM      Result Value Ref Range   Color, Urine YELLOW  YELLOW   APPearance TURBID (*) CLEAR   Specific Gravity, Urine 1.022  1.005 - 1.030   pH 6.0  5.0 - 8.0   Glucose, UA NEGATIVE  NEGATIVE mg/dL   Hgb urine dipstick MODERATE (*) NEGATIVE   Bilirubin Urine NEGATIVE  NEGATIVE   Ketones, ur 15 (*) NEGATIVE mg/dL   Protein, ur 30 (*) NEGATIVE mg/dL   Urobilinogen, UA 0.2  0.0 - 1.0 mg/dL   Nitrite NEGATIVE  NEGATIVE   Leukocytes, UA NEGATIVE  NEGATIVE  URINE MICROSCOPIC-ADD ON     Status: Abnormal   Collection Time    06/13/14  2:34 PM      Result Value Ref Range   Squamous Epithelial /  LPF RARE  RARE   WBC, UA 0-2  <3 WBC/hpf   RBC / HPF 0-2  <3 RBC/hpf   Bacteria, UA FEW (*) RARE   Casts HYALINE CASTS (*) NEGATIVE   Crystals CA OXALATE CRYSTALS (*) NEGATIVE  GLUCOSE, CAPILLARY     Status: None   Collection Time    06/13/14  3:51 PM      Result Value Ref Range   Glucose-Capillary 85  70 - 99 mg/dL  GLUCOSE, CAPILLARY     Status: None   Collection Time    06/13/14  8:05 PM      Result Value Ref Range   Glucose-Capillary 80  70 - 99 mg/dL   Comment 1 Notify RN     Comment 2 Documented in Chart    GLUCOSE, CAPILLARY     Status: None   Collection Time    06/13/14 11:49 PM      Result Value Ref Range   Glucose-Capillary 80  70 - 99 mg/dL   Comment 1 Notify RN     Comment 2 Documented in Chart    CBC     Status: Abnormal   Collection Time    06/14/14  4:03 AM      Result Value Ref Range   WBC 7.1  4.0 - 10.5 K/uL   RBC 2.67 (*) 4.22 - 5.81 MIL/uL   Hemoglobin 7.9 (*) 13.0 - 17.0 g/dL   HCT 26.6 (*) 39.0 - 52.0 %   MCV 99.6  78.0 - 100.0 fL   MCH  29.6  26.0 - 34.0 pg   MCHC 29.7 (*) 30.0 - 36.0 g/dL   RDW 17.4 (*) 11.5 - 15.5 %   Platelets 296  150 - 400 K/uL  BASIC METABOLIC PANEL     Status: Abnormal   Collection Time    06/14/14  4:03 AM      Result Value Ref Range   Sodium 141  137 - 147 mEq/L   Potassium 3.0 (*) 3.7 - 5.3 mEq/L   Chloride 104  96 - 112 mEq/L   CO2 25  19 - 32 mEq/L   Glucose, Bld 89  70 - 99 mg/dL   BUN 6  6 - 23 mg/dL   Creatinine, Ser 0.60  0.50 - 1.35 mg/dL   Calcium 8.0 (*) 8.4 - 10.5 mg/dL   GFR calc non Af Amer >90  >90 mL/min   GFR calc Af Amer >90  >90 mL/min   Comment: (NOTE)     The eGFR has been calculated using the CKD EPI equation.     This calculation has not been validated in all clinical situations.     eGFR's persistently <90 mL/min signify possible Chronic Kidney     Disease.   Anion gap 12  5 - 15  MAGNESIUM     Status: None   Collection Time    06/14/14  4:03 AM      Result Value Ref Range   Magnesium 2.1  1.5 - 2.5 mg/dL  GLUCOSE, CAPILLARY     Status: None   Collection Time    06/14/14  4:03 AM      Result Value Ref Range   Glucose-Capillary 96  70 - 99 mg/dL   Comment 1 Notify RN     Comment 2 Documented in Chart    GLUCOSE, CAPILLARY     Status: None   Collection Time    06/14/14  8:01 AM  Result Value Ref Range   Glucose-Capillary 83  70 - 99 mg/dL  GLUCOSE, CAPILLARY     Status: None   Collection Time    06/14/14 12:26 PM      Result Value Ref Range   Glucose-Capillary 94  70 - 99 mg/dL  GLUCOSE, CAPILLARY     Status: None   Collection Time    06/14/14  4:05 PM      Result Value Ref Range   Glucose-Capillary 82  70 - 99 mg/dL  CSF CULTURE     Status: None   Collection Time    06/14/14  6:40 PM      Result Value Ref Range   Specimen Description CSF     Special Requests SPINAL FLUID IN CUP     Gram Stain       Value: ABUNDANT WBC PRESENT,BOTH PMN AND MONONUCLEAR     NO ORGANISMS SEEN     Performed at [facility]     Performed at [facility]   Culture PENDING     Report Status PENDING    GRAM STAIN     Status: None   Collection Time    06/14/14  6:40 PM      Result Value Ref Range   Specimen Description CSF     Special Requests SPINAL FLUID IN CUP     Gram Stain       Value: ABUNDANT WBC PRESENT,BOTH PMN AND MONONUCLEAR     NO ORGANISMS SEEN     DIRECT SMEAR   Report Status 06/14/2014 FINAL    GLUCOSE, CAPILLARY     Status: None   Collection Time    06/14/14  8:56 PM      Result Value Ref Range   Glucose-Capillary 73  70 - 99 mg/dL   Comment 1 Notify RN    GLUCOSE, CAPILLARY     Status: None   Collection Time    06/14/14 11:54 PM      Result Value Ref Range   Glucose-Capillary 84  70 - 99 mg/dL   Comment 1 Notify RN    GLUCOSE, CAPILLARY     Status: None   Collection Time    06/15/14  4:11 AM      Result Value Ref Range   Glucose-Capillary 88  70 - 99 mg/dL  CBC     Status: Abnormal   Collection Time    06/15/14  4:49 AM      Result Value Ref Range   WBC 6.9  4.0 - 10.5 K/uL   RBC 2.39 (*) 4.22 - 5.81 MIL/uL   Hemoglobin 7.1 (*) 13.0 - 17.0 g/dL   HCT 23.6 (*) 39.0 - 52.0 %   MCV 98.7  78.0 - 100.0 fL   MCH 29.7  26.0 - 34.0 pg   MCHC 30.1  30.0 - 36.0 g/dL   RDW 17.6 (*) 11.5 - 15.5 %   Platelets 389  150 - 400 K/uL  BASIC METABOLIC PANEL     Status: Abnormal   Collection Time    06/15/14  4:49 AM      Result Value Ref Range   Sodium 142  137 - 147 mEq/L   Potassium 3.3 (*) 3.7 - 5.3 mEq/L   Chloride 104  96 - 112 mEq/L   CO2 24  19 - 32 mEq/L   Glucose, Bld 87  70 - 99 mg/dL   BUN 4 (*) 6 - 23 mg/dL   Creatinine,  Ser 0.62  0.50 - 1.35 mg/dL   Calcium 8.1 (*) 8.4 - 10.5 mg/dL   GFR calc non Af Amer >90  >90 mL/min   GFR calc Af Amer >90  >90 mL/min   Comment: (NOTE)     The eGFR has been calculated using the CKD EPI equation.     This calculation has not been validated in all clinical situations.     eGFR's persistently <90 mL/min signify possible Chronic Kidney     Disease.    Anion gap 14  5 - 15  GLUCOSE, CAPILLARY     Status: None   Collection Time    06/15/14  7:51 AM      Result Value Ref Range   Glucose-Capillary 89  70 - 99 mg/dL    Imaging / Studies: Dg Ugi W/water Sol Cm  06/14/2014   CLINICAL DATA:  Gastric perforation status post repair  EXAM: WATER SOLUBLE UPPER GI SERIES  TECHNIQUE: Single-column upper GI series was performed using water soluble contrast.  CONTRAST:  Water-soluble contrast.  150 cc Omnipaque  COMPARISON:  CT 06/10/2014  FLUOROSCOPY TIME:  1 min 7 seconds  FINDINGS: Water-soluble contrast was hand injected through the nasogastric tube. Contrast fills the gastric fundus and body without evidence of extraluminal leakage of the contrast material. Contrast flows into the proximal small bowel.  IMPRESSION: No evidence of leakage of the contrast with  filling of the stomach.   Electronically Signed   By: Suzy Bouchard M.D.   On: 06/14/2014 15:35    Medications / Allergies:  Scheduled Meds: . antiseptic oral rinse  7 mL Mouth Rinse QID  . chlorhexidine  15 mL Mouth Rinse BID  . Chlorhexidine Gluconate Cloth  6 each Topical Q0600  . enoxaparin (LOVENOX) injection  40 mg Subcutaneous Q24H  . fluconazole (DIFLUCAN) IV  200 mg Intravenous Q24H  . insulin aspart  0-9 Units Subcutaneous 6 times per day  . pantoprazole (PROTONIX) IV  40 mg Intravenous Q12H  . piperacillin-tazobactam (ZOSYN)  IV  3.375 g Intravenous Q8H   Continuous Infusions: . dextrose 5 % and 0.9% NaCl 75 mL/hr at 06/14/14 2111   PRN Meds:.hydrALAZINE, HYDROmorphone (DILAUDID) injection, LORazepam, metoprolol, ondansetron (ZOFRAN) IV, ondansetron  Antibiotics: Anti-infectives   Start     Dose/Rate Route Frequency Ordered Stop   06/11/14 0900  fluconazole (DIFLUCAN) IVPB 200 mg     200 mg 100 mL/hr over 60 Minutes Intravenous Every 24 hours 06/11/14 0832 06/18/14 0859   06/10/14 1900  vancomycin (VANCOCIN) 1,250 mg in sodium chloride 0.9 % 250 mL IVPB  Status:   Discontinued     1,250 mg 166.7 mL/hr over 90 Minutes Intravenous Every 8 hours 06/10/14 0927 06/12/14 0954   06/10/14 1700  piperacillin-tazobactam (ZOSYN) IVPB 3.375 g     3.375 g 12.5 mL/hr over 240 Minutes Intravenous Every 8 hours 06/10/14 0927     06/10/14 1100  vancomycin (VANCOCIN) 1,750 mg in sodium chloride 0.9 % 500 mL IVPB     1,750 mg 250 mL/hr over 120 Minutes Intravenous  Once 06/10/14 0927 06/10/14 1256   06/10/14 1030  piperacillin-tazobactam (ZOSYN) IVPB 3.375 g     3.375 g 100 mL/hr over 30 Minutes Intravenous  Once 06/10/14 0927 06/10/14 1030   06/10/14 0300  piperacillin-tazobactam (ZOSYN) IVPB 3.375 g     3.375 g 12.5 mL/hr over 240 Minutes Intravenous  Once 06/10/14 0249 06/10/14 0444      Assessment/Plan  Exploratory laparotomy, repair of perforated  pyloric channel ulcer---Dr. Barry Dienes 06/10/14  POD#5  -clamp NGT and give sips of clears, hopefully dc later today if able to tolerate clamping trial.  -UGI negative for a leak 9/15 -pain control/antiemetics  -PPI  -fluconazole/zosyn D#5/7  -SCD, lovenox -needs to mobilize  -IS  -VAC M-W-F  Diabetes mellitus type II  -q4h CBGs, SSI, D5NS, stable  ABL anemia  -hgb down to 7.1, asymptomatic, VSS.  Will repeat CBC ib AN Chronic back pains/p surgery  Open back wound  -Dr. Ellene Route following, appreciate assistance  -BID dry dressing changes  -if this surgery is urgent, then may proceed.  Otherwise, we recommend waiting until he's more stable and further out from surgery. Hypokalemia  -give 4 KCL IV -repeat labs in AM  Dispo-lives alone, no family, has neighbors. DC home with VAC, CM on board for insurance approval.    Erby Pian, [facility] Pager [phone](7A-4:30P) For consults and floor pages call [phone](7A-4:30P)  06/15/2014 9:03 AM

## 2014-06-15 NOTE — Progress Notes (Signed)
Patient ID: Devin [name], male   DOB: [birth year], 43 y.o.   MRN: [id number] Patient has started some liquids today. Seems to be tolerating this well. Wound aspiration from yesterday revealed pus. You'll require surgical debridement of his wound and possible removal of some of the hardware. We'll plan this when he is stable from a general surgical standpoint. I would like to do this before weeks end.

## 2014-06-16 LAB — GLUCOSE, CAPILLARY
GLUCOSE-CAPILLARY: 95 mg/dL (ref 70–99)
GLUCOSE-CAPILLARY: 97 mg/dL (ref 70–99)
GLUCOSE-CAPILLARY: 84 mg/dL (ref 70–99)
Glucose-Capillary: 69 mg/dL — ABNORMAL LOW (ref 70–99)
Glucose-Capillary: 73 mg/dL (ref 70–99)
Glucose-Capillary: 78 mg/dL (ref 70–99)
Glucose-Capillary: 80 mg/dL (ref 70–99)
Glucose-Capillary: 92 mg/dL (ref 70–99)

## 2014-06-16 LAB — CBC
HCT: 23.4 % — ABNORMAL LOW (ref 39.0–52.0)
Hemoglobin: 7 g/dL — ABNORMAL LOW (ref 13.0–17.0)
MCH: 29.2 pg (ref 26.0–34.0)
MCHC: 29.9 g/dL — AB (ref 30.0–36.0)
MCV: 97.5 fL (ref 78.0–100.0)
PLATELETS: 443 10*3/uL — AB (ref 150–400)
RBC: 2.4 MIL/uL — ABNORMAL LOW (ref 4.22–5.81)
RDW: 17.7 % — AB (ref 11.5–15.5)
WBC: 7.8 10*3/uL (ref 4.0–10.5)

## 2014-06-16 MED ORDER — OXYCODONE-ACETAMINOPHEN 5-325 MG PO TABS
1.0000 | ORAL_TABLET | ORAL | Status: DC | PRN
  Administered 2014-06-16 – 2014-07-02 (×72): 2 via ORAL
  Filled 2014-06-16 (×75): qty 2

## 2014-06-16 NOTE — Progress Notes (Signed)
Patient ID: Devin [name], male   DOB: October 11, 1970, 43 y.o.   MRN: [id number] 6 Days Post-Op  Subjective: Pt on the phone.  Wouldn't get off.  Says he's tolerating clear liquids with no issues.  RN states he can be put to a 3 person assist moving due to pain in his legs, likely from his back  Objective: Vital signs in last 24 hours: Temp:  [98.4 F (36.9 C)-99.3 F (37.4 C)] 99.3 F (37.4 C) (09/17 0603) Pulse Rate:  [90-97] 97 (09/17 0603) Resp:  [17-18] 18 (09/17 0603) BP: (140-149)/(81-90) 140/81 mmHg (09/17 0603) SpO2:  [98 %-100 %] 100 % (09/17 0603) Last BM Date: 06/15/14  Intake/Output from previous day: 09/16 0701 - 09/17 0700 In: 1249.2 [P.O.:240; I.V.:1009.2] Out: 600 [Urine:600] Intake/Output this shift:    PE: Abd: soft, minimally tender, wound VAC in place, +BS, ND Heart: regular Lungs: CTAB  Lab Results:   Recent Labs  06/15/14 0449 06/16/14 0352  WBC 6.9 7.8  HGB 7.1* 7.0*  HCT 23.6* 23.4*  PLT 389 443*   BMET  Recent Labs  06/14/14 0403 06/15/14 0449  NA 141 142  K 3.0* 3.3*  CL 104 104  CO2 25 24  GLUCOSE 89 87  BUN 6 4*  CREATININE 0.60 0.62  CALCIUM 8.0* 8.1*   PT/INR No results found for this basename: LABPROT, INR,  in the last 72 hours CMP     Component Value Date/Time   NA 142 06/15/2014 0449   K 3.3* 06/15/2014 0449   CL 104 06/15/2014 0449   CO2 24 06/15/2014 0449   GLUCOSE 87 06/15/2014 0449   BUN 4* 06/15/2014 0449   CREATININE 0.62 06/15/2014 0449   CALCIUM 8.1* 06/15/2014 0449   PROT 5.7* 06/13/2014 0604   ALBUMIN 1.8* 06/13/2014 0604   AST 14 06/13/2014 0604   ALT 11 06/13/2014 0604   ALKPHOS 108 06/13/2014 0604   BILITOT 0.2* 06/13/2014 0604   GFRNONAA >90 06/15/2014 0449   GFRAA >90 06/15/2014 0449   Lipase  No results found for this basename: lipase       Studies/Results: Dg Ugi W/water Sol Cm  06/14/2014   CLINICAL DATA:  Gastric perforation status post repair  EXAM: WATER SOLUBLE UPPER GI SERIES  TECHNIQUE:  Single-column upper GI series was performed using water soluble contrast.  CONTRAST:  Water-soluble contrast.  150 cc Omnipaque  COMPARISON:  CT 06/10/2014  FLUOROSCOPY TIME:  1 min 7 seconds  FINDINGS: Water-soluble contrast was hand injected through the nasogastric tube. Contrast fills the gastric fundus and body without evidence of extraluminal leakage of the contrast material. Contrast flows into the proximal small bowel.  IMPRESSION: No evidence of leakage of the contrast with  filling of the stomach.   Electronically Signed   By: Devin [name] M.D.   On: 06/14/2014 15:35    Anti-infectives: Anti-infectives   Start     Dose/Rate Route Frequency Ordered Stop   06/11/14 0900  fluconazole (DIFLUCAN) IVPB 200 mg     200 mg 100 mL/hr over 60 Minutes Intravenous Every 24 hours 06/11/14 0832 06/18/14 0859   06/10/14 1900  vancomycin (VANCOCIN) 1,250 mg in sodium chloride 0.9 % 250 mL IVPB  Status:  Discontinued     1,250 mg 166.7 mL/hr over 90 Minutes Intravenous Every 8 hours 06/10/14 0927 06/12/14 0954   06/10/14 1700  piperacillin-tazobactam (ZOSYN) IVPB 3.375 g     3.375 g 12.5 mL/hr over 240 Minutes Intravenous Every 8 hours 06/10/14 8657  06/10/14 1100  vancomycin (VANCOCIN) 1,750 mg in sodium chloride 0.9 % 500 mL IVPB     1,750 mg 250 mL/hr over 120 Minutes Intravenous  Once 06/10/14 0927 06/10/14 1256   06/10/14 1030  piperacillin-tazobactam (ZOSYN) IVPB 3.375 g     3.375 g 100 mL/hr over 30 Minutes Intravenous  Once 06/10/14 0927 06/10/14 1030   06/10/14 0300  piperacillin-tazobactam (ZOSYN) IVPB 3.375 g     3.375 g 12.5 mL/hr over 240 Minutes Intravenous  Once 06/10/14 0249 06/10/14 0444       Assessment/Plan   Exploratory laparotomy, repair of perforated pyloric channel ulcer---Dr. Donell [name] 06/10/14  POD#6  -tolerating clears, will advance to full liquids -convert to oral pain meds  -PPI  -fluconazole/zosyn D#6/7  -SCD, lovenox, may hold due to hgb of 7  -needs to  mobilize, will order PT to evaluate today for deconditioning  -IS  -VAC M-W-F  Diabetes mellitus type II  -q4h CBGs, SSI, D5NS, stable  ABL anemia  -hgb down to 7.0, asymptomatic, VSS. Will repeat CBC in am, may hold lovenox Chronic back pains/p surgery  Open back wound  -Dr. Danielle [name] following, appreciate assistance  -BID dry dressing changes  Hypokalemia  -recheck labs Dispo- once resolved from GI standpoint, Dr. Danielle [name] will take on his service to perform his back operation when able.   LOS: 6 days    Devin [name] 06/16/2014, 9:15 AM Pager: [phone]

## 2014-06-16 NOTE — Progress Notes (Signed)
Patient ID: Devin [name], male   DOB: 1971-07-08, 43 y.o.   MRN: [id number] Patient exhibits significant weakness in legs more so on the right than on the left. This appears to be worsening despite having had needle drainage of his wound infection. I believe it would be best to formally drain and debride his lumbar wound. I'll plan is for more afternoon. He'll be n.p.o. after midnight.

## 2014-06-16 NOTE — Progress Notes (Signed)
ANTIBIOTIC CONSULT NOTE   Pharmacy Consult for zosyn Indication: peritonitis  Allergies  Allergen Reactions  . Codeine Other (See Comments)    Skin "crawl"  . Hydrocodone Other (See Comments)    Skin "crawl"  . Other Nausea Only and Other (See Comments)    Darvocet    Patient Measurements: Height:  (177.8 cm) Weight: 229 lb 0.9 oz (103.9 kg) IBW/kg (Calculated) : 73  Vital Signs: Temp: 98.9 F (37.2 C) (09/17 1016) Temp src: Oral (09/17 1016) BP: 127/76 mmHg (09/17 1016) Pulse Rate: 96 (09/17 1016) Intake/Output from previous day: 09/16 0701 - 09/17 0700 In: 1249.2 [P.O.:240; I.V.:1009.2] Out: 600 [Urine:600] Intake/Output from this shift: Total I/O In: -  Out: 125 [Urine:125]  Labs:  Recent Labs  06/14/14 0403 06/15/14 0449 06/16/14 0352  WBC 7.1 6.9 7.8  HGB 7.9* 7.1* 7.0*  PLT 296 389 443*  CREATININE 0.60 0.62  --    Estimated Creatinine Clearance: 140.9 ml/min (by C-G formula based on Cr of 0.62). No results found for this basename: VANCOTROUGH, Leodis Binet, VANCORANDOM, GENTTROUGH, GENTPEAK, GENTRANDOM, TOBRATROUGH, TOBRAPEAK, TOBRARND, AMIKACINPEAK, AMIKACINTROU, AMIKACIN,  in the last 72 hours   Microbiology: Recent Results (from the past 720 hour(s))  MRSA PCR SCREENING     Status: Abnormal   Collection Time    06/10/14  9:14 AM      Result Value Ref Range Status   MRSA by PCR POSITIVE (*) NEGATIVE Final   Comment:            The GeneXpert MRSA Assay (FDA     approved for NASAL specimens     only), is one component of a     comprehensive MRSA colonization     surveillance program. It is not     intended to diagnose MRSA     infection nor to guide or     monitor treatment for     MRSA infections.     RESULT CALLED TO, READ BACK BY AND VERIFIED WITH:     Ricci Barker RN 11:30 06/10/14 ([email or web address])  URINE CULTURE     Status: None   Collection Time    06/10/14  9:14 AM      Result Value Ref Range Status   Specimen Description URINE,  CATHETERIZED   Final   Special Requests Normal   Final   Culture  Setup Time     Final   Value: 06/10/2014 17:14     Performed at [facility] Count     Final   Value: NO GROWTH     Performed at [facility]   Culture     Final   Value: NO GROWTH     Performed at [facility]   Report Status 06/11/2014 FINAL   Final  CSF CULTURE     Status: None   Collection Time    06/14/14  6:40 PM      Result Value Ref Range Status   Specimen Description CSF   Final   Special Requests SPINAL FLUID IN CUP   Final   Gram Stain     Final   Value: ABUNDANT WBC PRESENT,BOTH PMN AND MONONUCLEAR     NO ORGANISMS SEEN     Performed at [facility]     Performed at [facility]   Culture PENDING   Incomplete   Report Status PENDING   Incomplete  GRAM STAIN     Status: None   Collection  Time    06/14/14  6:40 PM      Result Value Ref Range Status   Specimen Description CSF   Final   Special Requests SPINAL FLUID IN CUP   Final   Gram Stain     Final   Value: ABUNDANT WBC PRESENT,BOTH PMN AND MONONUCLEAR     NO ORGANISMS SEEN     DIRECT SMEAR   Report Status 06/14/2014 FINAL   Final    Medical History: Past Medical History  Diagnosis Date  . Diabetes mellitus without complication     diet contolled  only  . Hypertension   . Anxiety     Medications:  Prescriptions prior to admission  Medication Sig Dispense Refill  . acetaminophen (TYLENOL) 500 MG tablet Take 2,000 mg by mouth every 6 (six) hours as needed for moderate pain or headache.       . lisinopril (PRINIVIL,ZESTRIL) 20 MG tablet Take 1 tablet (20 mg total) by mouth daily.  30 tablet  0  . naproxen (NAPROSYN) 500 MG tablet Take 500 mg by mouth 2 (two) times daily as needed for moderate pain.       Marland Kitchen oxyCODONE (OXY IR/ROXICODONE) 5 MG immediate release tablet Take 1 tablet (5 mg total) by mouth every 4 (four) hours as needed for moderate pain.  60 tablet  0  . OxyCODONE (OXYCONTIN) 15  mg T12A 12 hr tablet Take 1 tablet (15 mg total) by mouth every 12 (twelve) hours.  60 tablet  0  . PARoxetine (PAXIL) 40 MG tablet Take 1 tablet (40 mg total) by mouth every morning.  30 tablet  0   Scheduled:  . antiseptic oral rinse  7 mL Mouth Rinse QID  . chlorhexidine  15 mL Mouth Rinse BID  . fluconazole (DIFLUCAN) IV  200 mg Intravenous Q24H  . insulin aspart  0-9 Units Subcutaneous 6 times per day  . pantoprazole (PROTONIX) IV  40 mg Intravenous Q12H  . piperacillin-tazobactam (ZOSYN)  IV  3.375 g Intravenous Q8H     Assessment: 43 yo male here with perforated gastric ulcer s/p ex lap.  Patient also with concern for peritonitis to start antibiotics  9/11 vanc>>9/13 9/11 zosyn>> Diflucan  9/12>>7 days  9/11 urine>>ng final  Goal of Therapy:  Eradication of infection  Plan:  -Zosyn 3.375gm IV q8h -Will follow renal function, cultures and clinical progress -Please resume home med paxil.  Talbert Cage, Pharm D 06/16/2014 10:44 AM

## 2014-06-16 NOTE — Progress Notes (Signed)
Hypoglycemic Event  CBG: 69  Treatment: 15 GM carbohydrate snack  Symptoms: None  Follow-up CBG: Time:2046 CBG Result:80  Possible Reasons for Event: Inadequate meal intake  Comments/MD notified:Paul Lorelei Pont, Mariane Masters C  Remember to initiate Hypoglycemia Order Set & complete

## 2014-06-16 NOTE — Progress Notes (Signed)
PROGRESSING ON CLEARS.  TAKES A LOT OF PEOPLE TO MOVE HIM.  WILL HAVE BACK ADDRESSED ON GI ISSUES RESOLVED.  ADV DIET Friday HOPEFULLY

## 2014-06-17 ENCOUNTER — Encounter (HOSPITAL_COMMUNITY): Payer: BC Managed Care – PPO | Admitting: Anesthesiology

## 2014-06-17 ENCOUNTER — Encounter (HOSPITAL_COMMUNITY): Payer: Self-pay | Admitting: Anesthesiology

## 2014-06-17 ENCOUNTER — Encounter (HOSPITAL_COMMUNITY)
Admission: EM | Disposition: A | Discharge status: Home Health | Payer: Self-pay | Source: Home / Self Care | Attending: Neurological Surgery

## 2014-06-17 ENCOUNTER — Inpatient Hospital Stay (HOSPITAL_COMMUNITY): Payer: BC Managed Care – PPO | Admitting: Anesthesiology

## 2014-06-17 HISTORY — PX: LUMBAR WOUND DEBRIDEMENT: SHX1988

## 2014-06-17 LAB — GLUCOSE, CAPILLARY
GLUCOSE-CAPILLARY: 120 mg/dL — AB (ref 70–99)
Glucose-Capillary: 87 mg/dL (ref 70–99)
Glucose-Capillary: 86 mg/dL (ref 70–99)
Glucose-Capillary: 60 mg/dL — ABNORMAL LOW (ref 70–99)
Glucose-Capillary: 73 mg/dL (ref 70–99)
Glucose-Capillary: 115 mg/dL — ABNORMAL HIGH (ref 70–99)

## 2014-06-17 LAB — BASIC METABOLIC PANEL
ANION GAP: 9 (ref 5–15)
BUN: 4 mg/dL — ABNORMAL LOW (ref 6–23)
CHLORIDE: 104 meq/L (ref 96–112)
CO2: 29 mEq/L (ref 19–32)
CREATININE: 0.71 mg/dL (ref 0.50–1.35)
Calcium: 8 mg/dL — ABNORMAL LOW (ref 8.4–10.5)
GFR calc non Af Amer: >90 mL/min (ref >90)
Glucose, Bld: 89 mg/dL (ref 70–99)
Potassium: 3.2 mEq/L — ABNORMAL LOW (ref 3.7–5.3)
SODIUM: 142 meq/L (ref 137–147)

## 2014-06-17 LAB — CBC
HCT: 27.7 % — ABNORMAL LOW (ref 39.0–52.0)
Hemoglobin: 8.2 g/dL — ABNORMAL LOW (ref 13.0–17.0)
MCH: 29.7 pg (ref 26.0–34.0)
MCHC: 29.6 g/dL — ABNORMAL LOW (ref 30.0–36.0)
MCV: 100.4 fL — ABNORMAL HIGH (ref 78.0–100.0)
Platelets: 602 10*3/uL — ABNORMAL HIGH (ref 150–400)
RBC: 2.76 MIL/uL — ABNORMAL LOW (ref 4.22–5.81)
RDW: 18.2 % — ABNORMAL HIGH (ref 11.5–15.5)
WBC: 10 10*3/uL (ref 4.0–10.5)

## 2014-06-17 LAB — PREPARE RBC (CROSSMATCH)

## 2014-06-17 SURGERY — LUMBAR WOUND DEBRIDEMENT
Anesthesia: General

## 2014-06-17 MED ORDER — FENTANYL CITRATE 0.05 MG/ML IJ SOLN
INTRAMUSCULAR | Status: DC | PRN
  Administered 2014-06-17 (×3): 50 ug via INTRAVENOUS

## 2014-06-17 MED ORDER — DEXTROSE 50 % IV SOLN
50.0000 mL | Freq: Once | INTRAVENOUS | Status: DC
  Filled 2014-06-17: qty 50

## 2014-06-17 MED ORDER — PROMETHAZINE HCL 25 MG/ML IJ SOLN: 6.2500 mg | INTRAMUSCULAR | Status: DC | PRN

## 2014-06-17 MED ORDER — 0.9 % SODIUM CHLORIDE (POUR BTL) OPTIME
TOPICAL | Status: DC | PRN
  Administered 2014-06-17: 1000 mL

## 2014-06-17 MED ORDER — DEXAMETHASONE SODIUM PHOSPHATE 4 MG/ML IJ SOLN
INTRAMUSCULAR | Status: AC
  Filled 2014-06-17: qty 1

## 2014-06-17 MED ORDER — SODIUM CHLORIDE 0.9 % IR SOLN
Status: DC | PRN
  Administered 2014-06-17: 3000 mL

## 2014-06-17 MED ORDER — ONDANSETRON HCL 4 MG/2ML IJ SOLN
4.0000 mg | INTRAMUSCULAR | Status: DC | PRN
  Administered 2014-06-18 – 2014-06-21 (×2): 4 mg via INTRAVENOUS
  Filled 2014-06-17 (×2): qty 2

## 2014-06-17 MED ORDER — SODIUM CHLORIDE 0.9 % IV SOLN: Freq: Once | INTRAVENOUS | Status: DC

## 2014-06-17 MED ORDER — ACETAMINOPHEN 325 MG PO TABS
650.0000 mg | ORAL_TABLET | ORAL | Status: DC | PRN
  Filled 2014-06-17: qty 2

## 2014-06-17 MED ORDER — PHENYLEPHRINE HCL 10 MG/ML IJ SOLN
INTRAMUSCULAR | Status: AC
  Filled 2014-06-17: qty 1

## 2014-06-17 MED ORDER — GLYCOPYRROLATE 0.2 MG/ML IJ SOLN
INTRAMUSCULAR | Status: AC
  Filled 2014-06-17: qty 3

## 2014-06-17 MED ORDER — MIDAZOLAM HCL 5 MG/5ML IJ SOLN
INTRAMUSCULAR | Status: DC | PRN
  Administered 2014-06-17 (×2): 1 mg via INTRAVENOUS

## 2014-06-17 MED ORDER — NEOSTIGMINE METHYLSULFATE 10 MG/10ML IV SOLN
INTRAVENOUS | Status: DC | PRN
  Administered 2014-06-17: 4 mg via INTRAVENOUS

## 2014-06-17 MED ORDER — FENTANYL CITRATE 0.05 MG/ML IJ SOLN
INTRAMUSCULAR | Status: AC
  Filled 2014-06-17: qty 5

## 2014-06-17 MED ORDER — SODIUM CHLORIDE 0.9 % IJ SOLN
INTRAMUSCULAR | Status: AC
  Filled 2014-06-17: qty 10

## 2014-06-17 MED ORDER — GLUCAGON HCL RDNA (DIAGNOSTIC) 1 MG IJ SOLR
INTRAMUSCULAR | Status: AC
  Administered 2014-06-17: 1 mg
  Filled 2014-06-17: qty 1

## 2014-06-17 MED ORDER — SODIUM CHLORIDE 0.9 % IV SOLN
INTRAVENOUS | Status: DC
  Administered 2014-06-17 – 2014-06-18 (×2): via INTRAVENOUS

## 2014-06-17 MED ORDER — GLUCAGON HCL RDNA (DIAGNOSTIC) 1 MG IJ SOLR: 1.0000 mg | Freq: Once | INTRAMUSCULAR | Status: DC

## 2014-06-17 MED ORDER — PHENOL 1.4 % MT LIQD: 1.0000 | OROMUCOSAL | Status: DC | PRN

## 2014-06-17 MED ORDER — ROCURONIUM BROMIDE 100 MG/10ML IV SOLN
INTRAVENOUS | Status: DC | PRN
  Administered 2014-06-17: 50 mg via INTRAVENOUS

## 2014-06-17 MED ORDER — PROPOFOL 10 MG/ML IV BOLUS
INTRAVENOUS | Status: DC | PRN
  Administered 2014-06-17: 130 mg via INTRAVENOUS

## 2014-06-17 MED ORDER — HYDROMORPHONE HCL 1 MG/ML IJ SOLN
0.5000 mg | INTRAMUSCULAR | Status: DC | PRN
  Administered 2014-06-17 – 2014-06-18 (×6): 1 mg via INTRAVENOUS
  Filled 2014-06-17 (×6): qty 1

## 2014-06-17 MED ORDER — POTASSIUM CHLORIDE CRYS ER 20 MEQ PO TBCR
40.0000 meq | EXTENDED_RELEASE_TABLET | Freq: Two times a day (BID) | ORAL | Status: DC
  Administered 2014-06-17: 40 meq via ORAL
  Filled 2014-06-17: qty 2

## 2014-06-17 MED ORDER — ALUM & MAG HYDROXIDE-SIMETH 200-200-20 MG/5ML PO SUSP: 30.0000 mL | Freq: Four times a day (QID) | ORAL | Status: DC | PRN

## 2014-06-17 MED ORDER — SODIUM CHLORIDE 0.9 % IV SOLN
250.0000 mL | INTRAVENOUS | Status: DC
  Administered 2014-06-20: 250 mL via INTRAVENOUS

## 2014-06-17 MED ORDER — LACTATED RINGERS IV SOLN
INTRAVENOUS | Status: DC | PRN
  Administered 2014-06-17 (×2): via INTRAVENOUS

## 2014-06-17 MED ORDER — SODIUM CHLORIDE 0.9 % IJ SOLN
3.0000 mL | INTRAMUSCULAR | Status: DC | PRN
  Administered 2014-06-18: 3 mL via INTRAVENOUS

## 2014-06-17 MED ORDER — FENTANYL CITRATE 0.05 MG/ML IJ SOLN
25.0000 ug | INTRAMUSCULAR | Status: DC | PRN
  Administered 2014-06-17 (×2): 50 ug via INTRAVENOUS

## 2014-06-17 MED ORDER — SODIUM CHLORIDE 0.9 % IR SOLN
Status: DC | PRN
  Administered 2014-06-17: 16:00:00

## 2014-06-17 MED ORDER — MEPERIDINE HCL 25 MG/ML IJ SOLN: 6.2500 mg | INTRAMUSCULAR | Status: DC | PRN

## 2014-06-17 MED ORDER — FENTANYL CITRATE 0.05 MG/ML IJ SOLN
INTRAMUSCULAR | Status: AC
  Filled 2014-06-17: qty 2

## 2014-06-17 MED ORDER — MIDAZOLAM HCL 2 MG/2ML IJ SOLN
INTRAMUSCULAR | Status: AC
  Filled 2014-06-17: qty 2

## 2014-06-17 MED ORDER — SODIUM CHLORIDE 0.9 % IJ SOLN
3.0000 mL | Freq: Two times a day (BID) | INTRAMUSCULAR | Status: DC
  Administered 2014-06-17 – 2014-06-30 (×8): 3 mL via INTRAVENOUS

## 2014-06-17 MED ORDER — ONDANSETRON HCL 4 MG/2ML IJ SOLN
INTRAMUSCULAR | Status: DC | PRN
  Administered 2014-06-17: 4 mg via INTRAVENOUS

## 2014-06-17 MED ORDER — DEXTROSE 50 % IV SOLN: 1.0000 | Freq: Once | INTRAVENOUS | Status: DC

## 2014-06-17 MED ORDER — LIDOCAINE HCL (CARDIAC) 20 MG/ML IV SOLN
INTRAVENOUS | Status: DC | PRN
  Administered 2014-06-17: 60 mg via INTRAVENOUS

## 2014-06-17 MED ORDER — POTASSIUM CHLORIDE 10 MEQ/100ML IV SOLN: 10.0000 meq | INTRAVENOUS | Status: DC

## 2014-06-17 MED ORDER — GLYCOPYRROLATE 0.2 MG/ML IJ SOLN
INTRAMUSCULAR | Status: DC | PRN
  Administered 2014-06-17: .6 mg via INTRAVENOUS

## 2014-06-17 MED ORDER — ARTIFICIAL TEARS OP OINT
TOPICAL_OINTMENT | OPHTHALMIC | Status: DC | PRN
  Administered 2014-06-17: 1 via OPHTHALMIC

## 2014-06-17 MED ORDER — ACETAMINOPHEN 650 MG RE SUPP: 650.0000 mg | RECTAL | Status: DC | PRN

## 2014-06-17 MED ORDER — MENTHOL 3 MG MT LOZG: 1.0000 | LOZENGE | OROMUCOSAL | Status: DC | PRN

## 2014-06-17 MED ORDER — DEXAMETHASONE SODIUM PHOSPHATE 4 MG/ML IJ SOLN
INTRAMUSCULAR | Status: DC | PRN
  Administered 2014-06-17: 4 mg via INTRAVENOUS

## 2014-06-17 SURGICAL SUPPLY — 58 items
BAG DECANTER FOR FLEXI CONT (MISCELLANEOUS) ×3 IMPLANT
BENZOIN TINCTURE PRP APPL 2/3 (GAUZE/BANDAGES/DRESSINGS) IMPLANT
BLADE SURG ROTATE 9660 (MISCELLANEOUS) IMPLANT
CANISTER SUCT 3000ML (MISCELLANEOUS) ×3 IMPLANT
CLOSURE WOUND 1/2 X4 (GAUZE/BANDAGES/DRESSINGS)
CONT SPEC 4OZ CLIKSEAL STRL BL (MISCELLANEOUS) ×3 IMPLANT
CORDS BIPOLAR (ELECTRODE) ×3 IMPLANT
DECANTER SPIKE VIAL GLASS SM (MISCELLANEOUS) ×3 IMPLANT
DRAPE LAPAROTOMY 100X72X124 (DRAPES) ×3 IMPLANT
DRAPE POUCH INSTRU U-SHP 10X18 (DRAPES) ×3 IMPLANT
DRSG OPSITE POSTOP 4X8 (GAUZE/BANDAGES/DRESSINGS) ×3 IMPLANT
DRSG TELFA 3X8 NADH (GAUZE/BANDAGES/DRESSINGS) ×3 IMPLANT
DURAPREP 26ML APPLICATOR (WOUND CARE) ×3 IMPLANT
ELECT REM PT RETURN 9FT ADLT (ELECTROSURGICAL) ×3
ELECTRODE REM PT RTRN 9FT ADLT (ELECTROSURGICAL) ×1 IMPLANT
EVACUATOR 3/16  PVC DRAIN (DRAIN) ×2
EVACUATOR 3/16 PVC DRAIN (DRAIN) ×1 IMPLANT
GAUZE SPONGE 4X4 12PLY STRL (GAUZE/BANDAGES/DRESSINGS) ×3 IMPLANT
GAUZE SPONGE 4X4 16PLY XRAY LF (GAUZE/BANDAGES/DRESSINGS) IMPLANT
GLOVE BIO SURGEON STRL SZ7.5 (GLOVE) IMPLANT
GLOVE BIOGEL PI IND STRL 7.5 (GLOVE) IMPLANT
GLOVE BIOGEL PI IND STRL 8.5 (GLOVE) ×1 IMPLANT
GLOVE BIOGEL PI INDICATOR 7.5 (GLOVE)
GLOVE BIOGEL PI INDICATOR 8.5 (GLOVE) ×2
GLOVE ECLIPSE 8.5 STRL (GLOVE) ×3 IMPLANT
GLOVE EXAM NITRILE LRG STRL (GLOVE) IMPLANT
GLOVE EXAM NITRILE MD LF STRL (GLOVE) IMPLANT
GLOVE EXAM NITRILE XL STR (GLOVE) IMPLANT
GLOVE EXAM NITRILE XS STR PU (GLOVE) IMPLANT
GOWN STRL REUS W/ TWL LRG LVL3 (GOWN DISPOSABLE) ×1 IMPLANT
GOWN STRL REUS W/ TWL XL LVL3 (GOWN DISPOSABLE) ×1 IMPLANT
GOWN STRL REUS W/TWL 2XL LVL3 (GOWN DISPOSABLE) ×3 IMPLANT
GOWN STRL REUS W/TWL LRG LVL3 (GOWN DISPOSABLE) ×2
GOWN STRL REUS W/TWL XL LVL3 (GOWN DISPOSABLE) ×2
HANDPIECE INTERPULSE COAX TIP (DISPOSABLE) ×2
KIT BASIN OR (CUSTOM PROCEDURE TRAY) ×3 IMPLANT
KIT ROOM TURNOVER OR (KITS) ×3 IMPLANT
MARKER SKIN DUAL TIP RULER LAB (MISCELLANEOUS) ×3 IMPLANT
NEEDLE HYPO 22GX1.5 SAFETY (NEEDLE) ×3 IMPLANT
NS IRRIG 1000ML POUR BTL (IV SOLUTION) ×3 IMPLANT
PACK LAMINECTOMY NEURO (CUSTOM PROCEDURE TRAY) ×3 IMPLANT
PAD ARMBOARD 7.5X6 YLW CONV (MISCELLANEOUS) ×3 IMPLANT
SET HNDPC FAN SPRY TIP SCT (DISPOSABLE) ×1 IMPLANT
SPONGE LAP 4X18 X RAY DECT (DISPOSABLE) IMPLANT
SPONGE SURGIFOAM ABS GEL SZ50 (HEMOSTASIS) ×3 IMPLANT
STAPLER SKIN PROX WIDE 3.9 (STAPLE) IMPLANT
STRIP CLOSURE SKIN 1/2X4 (GAUZE/BANDAGES/DRESSINGS) IMPLANT
SUT VIC AB 0 CT1 18XCR BRD8 (SUTURE) ×1 IMPLANT
SUT VIC AB 0 CT1 8-18 (SUTURE) ×2
SUT VIC AB 2-0 CP2 18 (SUTURE) ×3 IMPLANT
SUT VIC AB 3-0 SH 8-18 (SUTURE) ×3 IMPLANT
SWAB CULTURE LIQ STUART DBL (MISCELLANEOUS) IMPLANT
SYR 20ML ECCENTRIC (SYRINGE) ×3 IMPLANT
SYR CONTROL 10ML LL (SYRINGE) ×3 IMPLANT
TOWEL OR 17X24 6PK STRL BLUE (TOWEL DISPOSABLE) ×3 IMPLANT
TOWEL OR 17X26 10 PK STRL BLUE (TOWEL DISPOSABLE) ×3 IMPLANT
TUBE ANAEROBIC SPECIMEN COL (MISCELLANEOUS) IMPLANT
WATER STERILE IRR 1000ML POUR (IV SOLUTION) ×3 IMPLANT

## 2014-06-17 NOTE — Anesthesia Procedure Notes (Signed)
Procedure Name: Intubation Date/Time: 06/17/2014 3:26 PM Performed by: Leonel Ramsay Pre-anesthesia Checklist: Patient identified, Timeout performed, Emergency Drugs available, Suction available and Patient being monitored Patient Re-evaluated:Patient Re-evaluated prior to inductionOxygen Delivery Method: Circle system utilized Preoxygenation: Pre-oxygenation with 100% oxygen Intubation Type: IV induction Ventilation: Mask ventilation without difficulty Laryngoscope Size: Mac and 4 Grade View: Grade I Tube type: Oral Tube size: 7.5 mm Number of attempts: 1 Airway Equipment and Method: Stylet Placement Confirmation: ETT inserted through vocal cords under direct vision,  positive ETCO2 and breath sounds checked- equal and bilateral Secured at: 22 cm Tube secured with: Tape Dental Injury: Teeth and Oropharynx as per pre-operative assessment

## 2014-06-17 NOTE — Progress Notes (Signed)
PT Cancellation Note  Patient Details Name: Devin [name] MRN: [id number] DOB: 18-Nov-1970   Cancelled Treatment:    Reason Eval/Treat Not Completed: Other (comment).  Noted plan for OR for back today.  Will await post-op status and order from neuro before proceeding with evaluation.  PT will check back tomorrow for new orders.  Thanks,    Rollene Rotunda. Ameliana Brashear, PT, DPT [phone]   06/17/2014, 1:31 PM

## 2014-06-17 NOTE — Op Note (Signed)
Date of surgery: 06/17/2014 Preoperative diagnosis: Loss of fixation at S1 status post L3 to sacrum fusion, questionable wound infection Postoperative diagnosis: Loss of fixation at S1, questionable wound infection Procedure: Debridement of wound, removal of S1 pedicular screws. Surgeon: Barnett Abu Anesthesia: Gen. 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 3 weeks after surgery he developed a wound hematoma,. This was decompressed by Dr. Dutch Quint. A week later the hematoma recurred. I then decompressed the hematoma again. He stayed in the hospital for lobar a week's time after that and it seemed that things were improving. About 10 days later he presented to the emergency room where an MRI suggested a recurrent fluid collection. Is transferred from [facility]. Verlon Au not able to recover the MRI from that facility. After several days of observation and a repeat MRI it appeared that the fluid collection that he had was much smaller and there is no sign of any infection. He was discharged home. He was readmitted a week ago with acute perforation from a gastric ulcer. He underwent surgical exploration of his abdomen. During this time a CT scan of the abdomen and pelvis the demonstrated a persistent fluid collection and loosening of his S1 pedicle screws. Is now taken to the operating room to undergo debridement of his wound. Removal of the S1 pedicle screws also be performed.  Procedure: Patient was brought to the operating room supine on a stretcher. After the smooth induction of general endotracheal anesthesia, he was turned prone. The back was prepped with alcohol and DuraPrep. A midline portion of the incision was reopened. The subcutaneous space some fluid was encountered at was watery with cellular debris. This was  removed. The lumbar dorsal fascia was then opened and further collection of fluid was encountered more deeply. This was fluid with a thicker consistency. Cultures from this fluid were obtained along with a Gram stain. There is no odor to the fluid itself. Area was then debris it is felt that some of the grumous material was consistent with bony graft material that was packed in the lateral gutters. A jet lavage was then connected and the wound was explored and debrided removing bits of tissue that were devitalized all along with some more the grumous material that was encountered. Once this area was cleansed with about 3 L of jet lavage the wound was inspected further. Region of the S1 pedicle screws was identified and it was clearly identified by moving the surgical construct at S1 was loose. A large bolt cutter was then used to transect the inferior portion of the rod above the S1 screw. S1 screw was easily removed on each side. With this area was inspected. Foraminotomies along the L5 and S1 nerve roots were checked in some thickened grumous material was removed from around each of these nerve roots. Once these areas were well decompressed on either side, the wound was again irrigated copiously with an antibiotic irrigating solution and then closure of the lumbar dorsal fascia was performed with #1 Vicryl. A drain was left in the subcutaneous space this being a large Hemovac drain. The subcutaneous tissue was closed with 203 0 Vicryl and surgical staples were placed in the skin. Blood loss for the procedure was estimated at approximately 150 cc . Patient was returned to the recovery room.

## 2014-06-17 NOTE — Progress Notes (Signed)
Patient ID: Devin [name], male   DOB: Dec 15, 1970, 43 y.o.   MRN: [id number] Patient has been having substantial pain. He is now being taken to the operating room to undergo surgical debridement of his lumbar wound. CT scan of the pelvis demonstrates a the patient has loss fixation at the S1 screws. These will likely need to be removed.

## 2014-06-17 NOTE — Discharge Summary (Signed)
Seen, agree with above. Diet as tolerated.  NO NSAIDS.

## 2014-06-17 NOTE — Progress Notes (Signed)
Patient ID: Devin [name], male   DOB: 08-11-1971, 43 y.o.   MRN: [id number] 7 Days Post-Op  Subjective: Pt feels well today as far as his belly is concerned.  He is tolerating full liquids with no issues.    Objective: Vital signs in last 24 hours: Temp:  [98.6 F (37 C)-98.9 F (37.2 C)] 98.9 F (37.2 C) (09/18 0605) Pulse Rate:  [83-89] 83 (09/18 0605) Resp:  [16-17] 16 (09/18 0605) BP: (125-143)/(69-94) 137/83 mmHg (09/18 0605) SpO2:  [94 %-99 %] 94 % (09/18 0605) Last BM Date: 06/15/14  Intake/Output from previous day: 09/17 0701 - 09/18 0700 In: 2850 [P.O.:1800; I.V.:1050] Out: 325 [Urine:325] Intake/Output this shift:    PE: Abd: soft, NT, ND, wound VAC in place with serosang output,  Heart: regular  Lab Results:   Recent Labs  06/15/14 0449 06/16/14 0352  WBC 6.9 7.8  HGB 7.1* 7.0*  HCT 23.6* 23.4*  PLT 389 443*   BMET  Recent Labs  06/15/14 0449 06/17/14 0430  NA 142 142  K 3.3* 3.2*  CL 104 104  CO2 24 29  GLUCOSE 87 89  BUN 4* 4*  CREATININE 0.62 0.71  CALCIUM 8.1* 8.0*   PT/INR No results found for this basename: LABPROT, INR,  in the last 72 hours CMP     Component Value Date/Time   NA 142 06/17/2014 0430   K 3.2* 06/17/2014 0430   CL 104 06/17/2014 0430   CO2 29 06/17/2014 0430   GLUCOSE 89 06/17/2014 0430   BUN 4* 06/17/2014 0430   CREATININE 0.71 06/17/2014 0430   CALCIUM 8.0* 06/17/2014 0430   PROT 5.7* 06/13/2014 0604   ALBUMIN 1.8* 06/13/2014 0604   AST 14 06/13/2014 0604   ALT 11 06/13/2014 0604   ALKPHOS 108 06/13/2014 0604   BILITOT 0.2* 06/13/2014 0604   GFRNONAA >90 06/17/2014 0430   GFRAA >90 06/17/2014 0430   Lipase  No results found for this basename: lipase       Studies/Results: No results found.  Anti-infectives: Anti-infectives   Start     Dose/Rate Route Frequency Ordered Stop   06/11/14 0900  fluconazole (DIFLUCAN) IVPB 200 mg     200 mg 100 mL/hr over 60 Minutes Intravenous Every 24 hours 06/11/14 0832 06/18/14  0859   06/10/14 1900  vancomycin (VANCOCIN) 1,250 mg in sodium chloride 0.9 % 250 mL IVPB  Status:  Discontinued     1,250 mg 166.7 mL/hr over 90 Minutes Intravenous Every 8 hours 06/10/14 0927 06/12/14 0954   06/10/14 1700  piperacillin-tazobactam (ZOSYN) IVPB 3.375 g     3.375 g 12.5 mL/hr over 240 Minutes Intravenous Every 8 hours 06/10/14 0927     06/10/14 1100  vancomycin (VANCOCIN) 1,750 mg in sodium chloride 0.9 % 500 mL IVPB     1,750 mg 250 mL/hr over 120 Minutes Intravenous  Once 06/10/14 0927 06/10/14 1256   06/10/14 1030  piperacillin-tazobactam (ZOSYN) IVPB 3.375 g     3.375 g 100 mL/hr over 30 Minutes Intravenous  Once 06/10/14 0927 06/10/14 1030   06/10/14 0300  piperacillin-tazobactam (ZOSYN) IVPB 3.375 g     3.375 g 12.5 mL/hr over 240 Minutes Intravenous  Once 06/10/14 0249 06/10/14 0444       Assessment/Plan Exploratory laparotomy, repair of perforated pyloric channel ulcer---Dr. Donell [name] 06/10/14  POD#7  -tolerating fulls -convert to oral pain meds  -PPI  -fluconazole/zosyn D#7/7, can stop today.  Will defer to Dr. Danielle [name] which abx he wants him to  remain on for his back -SCD, lovenox, may hold due to hgb of 7, CBC pending  -needs to mobilize, will order PT to evaluate today for deconditioning  -IS  -VAC M-W-F  Diabetes mellitus type II  -q4h CBGs, SSI, D5NS, stable  ABL anemia  -hgb down to 7.0, asymptomatic, VSS. Await cbc Chronic back pains/p surgery  Open back wound  -Dr. Danielle [name] following, appreciate assistance  -BID dry dressing changes  Hypokalemia  -replace K today  Dispo- Dr. Danielle [name] to take to OR today.  Patient surgically stable.  Will discuss transfer of service today.  Will continue to follow on Dr. Verlee [name] service.  Will do transfer summary as well.     LOS: 7 days    Devin [name] E 06/17/2014, 10:39 AM Pager: [phone]

## 2014-06-17 NOTE — Transfer of Care (Signed)
Immediate Anesthesia Transfer of Care Note  Patient: Devin [name]  Procedure(s) Performed: Procedure(s): Lumbar Wound Debridement with removal of hardware. (N/A)  Patient Location: PACU  Anesthesia Type:General  Level of Consciousness: awake and alert   Airway & Oxygen Therapy: Patient Spontanous Breathing and Patient connected to nasal cannula oxygen  Post-op Assessment: Report given to PACU RN and Post -op Vital signs reviewed and stable  Post vital signs: Reviewed and stable  Complications: No apparent anesthesia complications

## 2014-06-17 NOTE — Progress Notes (Signed)
Hypoglycemic Event  CBG: 60  Treatment: Glucagon IM 1 mg  Symptoms: Noneonly dizziness when turning head to fast  Follow-up CBG: Time:1339 CBG Result:120  Possible Reasons for Event: Inadequate meal intake  Comments/MD notified: Yes; Dr. Zola Button, [place]  Remember to initiate Hypoglycemia Order Set & complete

## 2014-06-17 NOTE — Progress Notes (Signed)
Seen, agree with above.    Doing better.   To or with [name] this afternoon.

## 2014-06-17 NOTE — Discharge Summary (Signed)
Patient ID: Devin [name] MRN: [id number] DOB/AGE: [birth year] 45 y.o.  Admit date: 06/10/2014 Discharge date: pending  Procedures: Exploratory laparotomy, repair of perforated pyloric channel ulcer Dr. Almond Lint 06-10-14  Consults: pulmonary/intensive care and neurosurgery  Reason for Admission: Pt is a 43 yo M who has been undergoing treatment by the neurosurgery team for back issues. He underwent lumbar fusion earlier in the summer and had several evacuations of hematomas. He went to the hospital in [place] and was seen to have another fluid collection in his surgical site. He was transferred here to be admitted by them for treatment.  However, when the EDP evaluated him, he was found to be tachycardic and tender on his abdominal exam. CT was performed, and this demonstrated free air and finding consistent with a perforated ulcer. This goes along with his history as he has been taking narcotics, and many NSAIDs. He has used ibuprofen, aspirin, and possibly some aleve in addition to his narcotics.   Admission Diagnoses:  1. Perforated ulcer 2. SIRS 3. Back infection  4. HTN 5. DM  Hospital Course: 1. Perforated pyloric channel ulcer: The patient was admitted by general surgery and taken to the OR for a laparotomy with repair of perforated pyloric channel ulcer.  CCM was consulted as he was left intubated post operatively.  He was able to be extubated on POD 1.  His NGT remained in place.  He remained on Zosyn and Diflucan.  On 06-17-14 he completed his full 7 day course of abx therapy for his abdominal process.  He began stabilizing and was transferred to the floor after a couple days in ICU.  He underwent an UGI on POD 4, which was negative for a leak.  His NGT was then removed and he was stared on a clear liquid diet.  His diet was advanced to fulls.  He is tolerating this well right now.  I was going to place him on a regular diet, but he is NPO today for neurosurgery procedure.  He does  have an abdominal wound VAC on right now.  We will follow while here to determine if he needs this at home or whether he can be transitioned to WD dressing changes at home/rehab.  2. Back infection: The patient was initially sent her for neurosurgery prior to his perforation being noted.  He had 110cc of purulent fluid drained from his back postoperatively.  Neurosurgery, [name] is following and planning on surgery today for his back due to infection and progressive weakness.  3. HTN: This has remained stable.  He is currently on hydralazine for this, but after his NS procedure can be restarted on his home oral dose of lisinopril  4. DM: this has also remained stable.  He is currently on SSI  5. Hypokalemia: He is being replaced with potassium today.  He will get repeat BMET in the am as well.  6. Anemia: His hemoglobin post operatively has trended down.  It was 7.0 on 06-16-14.  This is currently pending.  His Lovenox is on hold right now while following this trend.  He is not symptomatic and therefore has not been transfused.    Discharge Diagnoses:  Active Problems:   Perforated gastric ulcer   Acute respiratory failure, unspecified whether with hypoxia or hypercapnia   Peritonitis (acute) generalized   Pain   AKI (acute kidney injury)   DM type 2 (diabetes mellitus, type 2) HTN Back infection Anemia Hypokalemia  Discharge Medications: pending  Discharge Instructions:  pending  Signed: Krystina Strieter E 06/17/2014, 10:43 AM

## 2014-06-17 NOTE — Anesthesia Preprocedure Evaluation (Addendum)
Anesthesia Evaluation  Patient identified by MRN, date of birth, ID band Patient awake    Reviewed: Allergy & Precautions  Airway Mallampati: II TM Distance: >3 FB     Dental  (+) Teeth Intact, Dental Advisory Given   Pulmonary Current Smoker (20 pack year),  breath sounds clear to auscultation        Cardiovascular hypertension, Pt. on medications Rhythm:Regular Rate:Normal     Neuro/Psych    GI/Hepatic   Endo/Other  diabetes, Type 2  Renal/GU      Musculoskeletal   Abdominal (+) + obese,   Peds  Hematology   Anesthesia Other Findings   Reproductive/Obstetrics                         Anesthesia Physical Anesthesia Plan  ASA: III  Anesthesia Plan: General   Post-op Pain Management:    Induction: Intravenous  Airway Management Planned: Oral ETT  Additional Equipment:   Intra-op Plan:   Post-operative Plan: Extubation in OR  Informed Consent: I have reviewed the patients History and Physical, chart, labs and discussed the procedure including the risks, benefits and alternatives for the proposed anesthesia with the patient or authorized representative who has indicated his/her understanding and acceptance.     Plan Discussed with:   Anesthesia Plan Comments: (Have ordered T & C 1 unit PRBC, may possibly need in OR or early post-op)        Anesthesia Quick Evaluation

## 2014-06-18 LAB — BASIC METABOLIC PANEL
Anion gap: 11 (ref 5–15)
BUN: 5 mg/dL — ABNORMAL LOW (ref 6–23)
CALCIUM: 7.7 mg/dL — AB (ref 8.4–10.5)
CO2: 23 mEq/L (ref 19–32)
Chloride: 107 mEq/L (ref 96–112)
Creatinine, Ser: 0.59 mg/dL (ref 0.50–1.35)
Glucose, Bld: 122 mg/dL — ABNORMAL HIGH (ref 70–99)
Potassium: 4.6 mEq/L (ref 3.7–5.3)
Sodium: 141 mEq/L (ref 137–147)

## 2014-06-18 LAB — POCT I-STAT 4, (NA,K, GLUC, HGB,HCT)
Glucose, Bld: 78 mg/dL (ref 70–99)
HCT: 23 % — ABNORMAL LOW (ref 39.0–52.0)
Hemoglobin: 7.8 g/dL — ABNORMAL LOW (ref 13.0–17.0)
POTASSIUM: 3.7 meq/L (ref 3.7–5.3)
Sodium: 141 mEq/L (ref 137–147)

## 2014-06-18 LAB — CBC
HCT: 23.6 % — ABNORMAL LOW (ref 39.0–52.0)
Hemoglobin: 7 g/dL — ABNORMAL LOW (ref 13.0–17.0)
MCH: 29.2 pg (ref 26.0–34.0)
MCHC: 29.7 g/dL — ABNORMAL LOW (ref 30.0–36.0)
MCV: 98.3 fL (ref 78.0–100.0)
PLATELETS: 597 10*3/uL — AB (ref 150–400)
RBC: 2.4 MIL/uL — ABNORMAL LOW (ref 4.22–5.81)
RDW: 18.4 % — ABNORMAL HIGH (ref 11.5–15.5)
WBC: 9.9 10*3/uL (ref 4.0–10.5)

## 2014-06-18 LAB — GLUCOSE, CAPILLARY
GLUCOSE-CAPILLARY: 102 mg/dL — AB (ref 70–99)
GLUCOSE-CAPILLARY: 133 mg/dL — AB (ref 70–99)
GLUCOSE-CAPILLARY: 187 mg/dL — AB (ref 70–99)
GLUCOSE-CAPILLARY: 90 mg/dL (ref 70–99)
Glucose-Capillary: 102 mg/dL — ABNORMAL HIGH (ref 70–99)
Glucose-Capillary: 121 mg/dL — ABNORMAL HIGH (ref 70–99)
Glucose-Capillary: 134 mg/dL — ABNORMAL HIGH (ref 70–99)

## 2014-06-18 MED ORDER — HYDROMORPHONE HCL 1 MG/ML IJ SOLN
1.0000 mg | INTRAMUSCULAR | Status: DC | PRN
  Administered 2014-06-18 – 2014-06-23 (×33): 1 mg via INTRAVENOUS
  Filled 2014-06-18 (×34): qty 1

## 2014-06-18 MED ORDER — VANCOMYCIN HCL 10 G IV SOLR
2000.0000 mg | Freq: Once | INTRAVENOUS | Status: AC
  Administered 2014-06-18: 2000 mg via INTRAVENOUS
  Filled 2014-06-18: qty 2000

## 2014-06-18 MED ORDER — VANCOMYCIN HCL 500 MG IV SOLR
500.0000 mg | Freq: Two times a day (BID) | INTRAVENOUS | Status: DC
  Filled 2014-06-18 (×2): qty 500

## 2014-06-18 NOTE — Progress Notes (Signed)
ANTIBIOTIC CONSULT NOTE - INITIAL  Pharmacy Consult for vancomycin Indication: CSF culture positive for MRSA  Allergies  Allergen Reactions  . Codeine Other (See Comments)    Skin "crawl"  . Hydrocodone Other (See Comments)    Skin "crawl"  . Other Nausea Only and Other (See Comments)    Darvocet    Patient Measurements: Height:  (177.8 cm) Weight: 229 lb 0.9 oz (103.9 kg) IBW/kg (Calculated) : 73   Vital Signs: Temp: 98.6 F (37 C) (09/19 1020) Temp src: Oral (09/19 0643) BP: 133/89 mmHg (09/19 1014) Pulse Rate: 96 (09/19 1014) Intake/Output from previous day: 09/18 0701 - 09/19 0700 In: 3835.8 [P.O.:1440; I.V.:2395.8] Out: 1600 [Urine:850; Drains:150; Blood:600] Intake/Output from this shift:    Labs:  Recent Labs  06/16/14 0352 06/17/14 0430 06/17/14 1130 06/18/14 0054  WBC 7.8  --  10.0 9.9  HGB 7.0*  --  8.2* 7.0*  PLT 443*  --  602* 597*  CREATININE  --  0.71  --  0.59   Estimated Creatinine Clearance: 140.9 ml/min (by C-G formula based on Cr of 0.59). No results found for this basename: VANCOTROUGH, Leodis Binet, VANCORANDOM, GENTTROUGH, GENTPEAK, GENTRANDOM, TOBRATROUGH, TOBRAPEAK, TOBRARND, AMIKACINPEAK, AMIKACINTROU, AMIKACIN,  in the last 72 hours   Microbiology: Recent Results (from the past 720 hour(s))  MRSA PCR SCREENING     Status: Abnormal   Collection Time    06/10/14  9:14 AM      Result Value Ref Range Status   MRSA by PCR POSITIVE (*) NEGATIVE Final   Comment:            The GeneXpert MRSA Assay (FDA     approved for NASAL specimens     only), is one component of a     comprehensive MRSA colonization     surveillance program. It is not     intended to diagnose MRSA     infection nor to guide or     monitor treatment for     MRSA infections.     RESULT CALLED TO, READ BACK BY AND VERIFIED WITH:     Ricci Barker RN 11:30 06/10/14 ([email or web address])  URINE CULTURE     Status: None   Collection Time    06/10/14  9:14 AM      Result Value  Ref Range Status   Specimen Description URINE, CATHETERIZED   Final   Special Requests Normal   Final   Culture  Setup Time     Final   Value: 06/10/2014 17:14     Performed at [facility] Count     Final   Value: NO GROWTH     Performed at [facility]   Culture     Final   Value: NO GROWTH     Performed at [facility]   Report Status 06/11/2014 FINAL   Final  CSF CULTURE     Status: None   Collection Time    06/14/14  6:40 PM      Result Value Ref Range Status   Specimen Description CSF   Final   Special Requests SPINAL FLUID IN CUP   Final   Gram Stain     Final   Value: ABUNDANT WBC PRESENT,BOTH PMN AND MONONUCLEAR     NO ORGANISMS SEEN     Performed at [facility]     Performed at [facility]   Culture     Final   Value:  FEW METHICILLIN RESISTANT STAPHYLOCOCCUS AUREUS     Note: RIFAMPIN AND GENTAMICIN SHOULD NOT BE USED AS SINGLE DRUGS FOR TREATMENT OF STAPH INFECTIONS. This organism DOES NOT demonstrate inducible Clindamycin resistance in vitro. CRITICAL RESULT CALLED TO, READ BACK BY AND VERIFIED WITH: TARA CROSS      06/17/14 1300 BY [email or web address]     Performed at [facility]   Report Status 06/17/2014 FINAL   Final   Organism ID, Bacteria METHICILLIN RESISTANT STAPHYLOCOCCUS AUREUS   Final  GRAM STAIN     Status: None   Collection Time    06/14/14  6:40 PM      Result Value Ref Range Status   Specimen Description CSF   Final   Special Requests SPINAL FLUID IN CUP   Final   Gram Stain     Final   Value: ABUNDANT WBC PRESENT,BOTH PMN AND MONONUCLEAR     NO ORGANISMS SEEN     DIRECT SMEAR   Report Status 06/14/2014 FINAL   Final  WOUND CULTURE     Status: None   Collection Time    06/17/14  4:57 PM      Result Value Ref Range Status   Specimen Description WOUND BACK   Final   Special Requests NONE   Final   Gram Stain     Final   Value: FEW WBC PRESENT,BOTH PMN AND MONONUCLEAR     NO SQUAMOUS EPITHELIAL  CELLS SEEN     NO ORGANISMS SEEN     Performed at [facility]   Culture PENDING   Incomplete   Report Status PENDING   Incomplete  ANAEROBIC CULTURE     Status: None   Collection Time    06/17/14  4:57 PM      Result Value Ref Range Status   Specimen Description WOUND BACK   Final   Special Requests NONE   Final   Gram Stain     Final   Value: FEW WBC PRESENT,BOTH PMN AND MONONUCLEAR     NO SQUAMOUS EPITHELIAL CELLS SEEN     NO ORGANISMS SEEN     Performed at [facility]   Culture     Final   Value: NO ANAEROBES ISOLATED; CULTURE IN PROGRESS FOR 5 DAYS     Performed at [facility]   Report Status PENDING   Incomplete    Medical History: Past Medical History  Diagnosis Date  . Diabetes mellitus without complication     diet contolled  only  . Hypertension   . Anxiety     Medications:  Scheduled:  . sodium chloride   Intravenous Once  . antiseptic oral rinse  7 mL Mouth Rinse QID  . chlorhexidine  15 mL Mouth Rinse BID  . dextrose  50 mL Intravenous Once  . glucagon (human recombinant)  1 mg Intravenous Once  . insulin aspart  0-9 Units Subcutaneous 6 times per day  . pantoprazole (PROTONIX) IV  40 mg Intravenous Q12H  . piperacillin-tazobactam (ZOSYN)  IV  3.375 g Intravenous Q8H  . sodium chloride  3 mL Intravenous Q12H  . vancomycin  2,000 mg Intravenous Once   Assessment: 43 yo man to start vancomycin for positive CSF culture for MRSA.  He is also on zosyn for peritonitis. He went to OR yesterday for debridment of lumbar wound.    Goal of Therapy:  Vancomycin trough level 15-20 mcg/ml  Plan:  Vancomycin 2000 mg IV X 1 then 1000 mg  IV q12 hours as per obesity nomogram. Cont zosyn F/u renal function, cultures and clinical course  Thanks for allowing pharmacy to be a part of this patient's care.  Talbert Cage, PharmD Clinical Pharmacist, [phone] 06/18/2014,1:20 PM

## 2014-06-18 NOTE — Evaluation (Signed)
Occupational Therapy Evaluation Patient Details Name: Devin [name] MRN: [id number] DOB: 02/14/1971 Today's Date: 06/18/2014    History of Present Illness Pt is a 43 yo M who has been undergoing treatment by the neurosurgery team for back issues. He underwent lumbar fusion earlier in the summer and had several evacuations of hematomas. He went to the hospital in [place] and was seen to have another fluid collection in his surgical site.  Since admission pt has undergone [name], repair of perforated pyloric channel ulcer on 06/10/14.  And most recently s/p Debridement of wound, removal of S1 pedicular screws.     Clinical Impression   Pt admitted with above.  Pt reports feeling gradually weaker over past few months and presents as generally deconditioned today.  Pt lives alone and has been receiving assist from neighbors for past several weeks. Pt will benefit from acute OT services to address below problem list.  Recommending HHOT for d/c planning in order to further maximize independence and safety at home.    Follow Up Recommendations  Home health OT;Supervision/Assistance - 24 hour    Equipment Recommendations   (TBD)    Recommendations for Other Services       Precautions / Restrictions Precautions Precautions: Back      Mobility Bed Mobility Overal bed mobility: Needs Assistance Bed Mobility: Sidelying to Sit   Sidelying to sit: Min guard          Transfers Overall transfer level: Needs assistance Equipment used: Rolling walker (2 wheeled) Transfers: Sit to/from Stand Sit to Stand: Min assist              Balance Overall balance assessment: Needs assistance Sitting-balance support: Bilateral upper extremity supported Sitting balance-Leahy Scale: Fair     Standing balance support: Bilateral upper extremity supported Standing balance-Leahy Scale: Fair                              ADL Overall ADL's : Needs  assistance/impaired         Upper Body Bathing: Set up;Sitting   Lower Body Bathing: Minimal assistance;Sit to/from stand   Upper Body Dressing : Set up;Sitting   Lower Body Dressing: Minimal assistance;Sit to/from stand   Toilet Transfer: Minimal assistance;Ambulation;RW   Toileting- Clothing Manipulation and Hygiene: Minimal assistance;Sit to/from stand       Functional mobility during ADLs: Minimal assistance;Rolling walker General ADL Comments: Pt requires incr time for all tasks due to pain.  Pt declining to wear back brace (reports it hurts too much).  Will need clarification if pt is to wear back brace with abdominal wound vac.  Limited pt mobility today due to not wearing back brace (ambulated from bed to chair).     Vision                     Perception     Praxis      Pertinent Vitals/Pain Pain Assessment: 0-10 Pain Score: 7  Pain Location: abdomen and back Pain Descriptors / Indicators: Dull Pain Intervention(s): Limited activity within patient's tolerance     Hand Dominance Right   Extremity/Trunk Assessment Upper Extremity Assessment Upper Extremity Assessment: Generalized weakness   Lower Extremity Assessment Lower Extremity Assessment: Generalized weakness       Communication Communication Communication: No difficulties   Cognition Arousal/Alertness: Awake/alert Behavior During Therapy: WFL for tasks assessed/performed Overall Cognitive Status: Within Functional Limits for tasks assessed  General Comments       Exercises       Shoulder Instructions      Home Living Family/patient expects to be discharged to:: Private residence Living Arrangements: Alone Available Help at Discharge: Neighbor;Available PRN/intermittently Type of Home: Mobile home Home Access: Stairs to enter Entrance Stairs-Number of Steps: 9 Entrance Stairs-Rails: Right;Left;Can reach both Home Layout: One level     Bathroom  Shower/Tub: Tub/shower unit Shower/tub characteristics: Engineer, building services: Standard Bathroom Accessibility: Yes How Accessible: Accessible via walker Home Equipment: Walker - 2 wheels;Shower seat;Adaptive equipment Adaptive Equipment: Reacher (toilet aid)        Prior Functioning/Environment Level of Independence: Needs assistance  Gait / Transfers Assistance Needed: Pt reports very limited mobility within house over past few months. ADL's / Homemaking Assistance Needed: neighbors assisting with driving and homemaking tasks        OT Diagnosis: Generalized weakness;Acute pain   OT Problem List: Decreased strength;Decreased activity tolerance;Impaired balance (sitting and/or standing);Decreased knowledge of use of DME or AE;Pain   OT Treatment/Interventions: Self-care/ADL training;DME and/or AE instruction;Therapeutic activities;Patient/family education;Balance training    OT Goals(Current goals can be found in the care plan section) Acute Rehab OT Goals Patient Stated Goal: To be able to return home and ambulate without pain OT Goal Formulation: With patient Time For Goal Achievement: 06/25/14 Potential to Achieve Goals: Good  OT Frequency: Min 2X/week   Barriers to D/C:            Co-evaluation              End of Session Equipment Utilized During Treatment: Gait belt;Rolling walker Nurse Communication: Mobility status  Activity Tolerance: Patient limited by pain Patient left: in chair;with call bell/phone within reach   Time: 0960-4540 OT Time Calculation (min): 26 min Charges:  OT General Charges $OT Visit: 1 Procedure OT Evaluation $Initial OT Evaluation Tier I: 1 Procedure OT Treatments $Self Care/Home Management : 8-22 mins G-Codes:    Cipriano Mile 06/18/2014, 11:16 AM  06/18/2014 Cipriano Mile OTR/L Pager [phone] Office [phone]

## 2014-06-18 NOTE — Progress Notes (Signed)
Patient ID: Devin [name], male   DOB: 1971-03-20, 43 y.o.   MRN: [id number] C/o incisional pain. No weakness. Still draining. No headache

## 2014-06-18 NOTE — Progress Notes (Signed)
1 Day Post-Op  Subjective: Seems to be stable following debridement of back wound and removal of S1 pedicular screws by Dr. Layla Maw yesterday. He is hungry. Denies abdominal pain or nausea. Wants to advance diet.  Hemoglobin 7. Tolerating. A VBC 9900. Potassium 4.6. Creatinine 0.59.  Objective: Vital signs in last 24 hours: Temp:  [98 F (36.7 C)-99.1 F (37.3 C)] 98.7 F (37.1 C) (09/19 0643) Pulse Rate:  [86-107] 93 (09/19 0643) Resp:  [12-20] 20 (09/19 0643) BP: (131-151)/(70-91) 139/85 mmHg (09/19 0643) SpO2:  [94 %-100 %] 98 % (09/19 0643) Last BM Date: 06/17/14  Intake/Output from previous day: 09/18 0701 - 09/19 0700 In: 3835.8 [P.O.:1440; I.V.:2395.8] Out: 1600 [Urine:850; Drains:150; Blood:600] Intake/Output this shift:    General appearance: alert. No distress. Very talkative. Numerous tattoos noted GI: abdomen is soft. Nontender. Nondistended. Active bowel sounds. Negative pressure dressing in place  Lab Results:  Results for orders placed during the hospital encounter of 06/10/14 (from the past 24 hour(s))  CBC     Status: Abnormal   Collection Time    06/17/14 11:30 AM      Result Value Ref Range   WBC 10.0  4.0 - 10.5 K/uL   RBC 2.76 (*) 4.22 - 5.81 MIL/uL   Hemoglobin 8.2 (*) 13.0 - 17.0 g/dL   HCT 86.5 (*) 78.4 - 69.6 %   MCV 100.4 (*) 78.0 - 100.0 fL   MCH 29.7  26.0 - 34.0 pg   MCHC 29.6 (*) 30.0 - 36.0 g/dL   RDW 29.5 (*) 28.4 - 13.2 %   Platelets 602 (*) 150 - 400 K/uL  GLUCOSE, CAPILLARY     Status: Abnormal   Collection Time    06/17/14 11:57 AM      Result Value Ref Range   Glucose-Capillary 60 (*) 70 - 99 mg/dL  GLUCOSE, CAPILLARY     Status: Abnormal   Collection Time    06/17/14  1:39 PM      Result Value Ref Range   Glucose-Capillary 120 (*) 70 - 99 mg/dL  TYPE AND SCREEN     Status: None   Collection Time    06/17/14  4:15 PM      Result Value Ref Range   ABO/RH(D) O POS     Antibody Screen NEG     Sample Expiration 06/20/2014      Unit Number [id number]     Blood Component Type RED CELLS,LR     Unit division 00     Status of Unit ALLOCATED     Transfusion Status OK TO TRANSFUSE     Crossmatch Result Compatible     Unit Number [id number]     Blood Component Type RED CELLS,LR     Unit division 00     Status of Unit ALLOCATED     Transfusion Status OK TO TRANSFUSE     Crossmatch Result Compatible     Unit Number [id number]     Blood Component Type RED CELLS,LR     Unit division 00     Status of Unit ALLOCATED     Transfusion Status OK TO TRANSFUSE     Crossmatch Result Compatible    GLUCOSE, CAPILLARY     Status: None   Collection Time    06/17/14  6:27 PM      Result Value Ref Range   Glucose-Capillary 73  70 - 99 mg/dL  PREPARE RBC (CROSSMATCH)     Status: None   Collection Time  06/17/14  7:00 PM      Result Value Ref Range   Order Confirmation ORDER PROCESSED BY BLOOD BANK    GLUCOSE, CAPILLARY     Status: Abnormal   Collection Time    06/17/14  8:28 PM      Result Value Ref Range   Glucose-Capillary 115 (*) 70 - 99 mg/dL  GLUCOSE, CAPILLARY     Status: Abnormal   Collection Time    06/18/14 12:02 AM      Result Value Ref Range   Glucose-Capillary 134 (*) 70 - 99 mg/dL  BASIC METABOLIC PANEL     Status: Abnormal   Collection Time    06/18/14 12:54 AM      Result Value Ref Range   Sodium 141  137 - 147 mEq/L   Potassium 4.6  3.7 - 5.3 mEq/L   Chloride 107  96 - 112 mEq/L   CO2 23  19 - 32 mEq/L   Glucose, Bld 122 (*) 70 - 99 mg/dL   BUN 5 (*) 6 - 23 mg/dL   Creatinine, Ser 1.61  0.50 - 1.35 mg/dL   Calcium 7.7 (*) 8.4 - 10.5 mg/dL   GFR calc non Af Amer >90  >90 mL/min   GFR calc Af Amer >90  >90 mL/min   Anion gap 11  5 - 15  CBC     Status: Abnormal   Collection Time    06/18/14 12:54 AM      Result Value Ref Range   WBC 9.9  4.0 - 10.5 K/uL   RBC 2.40 (*) 4.22 - 5.81 MIL/uL   Hemoglobin 7.0 (*) 13.0 - 17.0 g/dL   HCT 09.6 (*) 04.5 - 40.9 %   MCV 98.3  78.0 - 100.0 fL    MCH 29.2  26.0 - 34.0 pg   MCHC 29.7 (*) 30.0 - 36.0 g/dL   RDW 81.1 (*) 91.4 - 78.2 %   Platelets 597 (*) 150 - 400 K/uL  GLUCOSE, CAPILLARY     Status: Abnormal   Collection Time    06/18/14  3:34 AM      Result Value Ref Range   Glucose-Capillary 187 (*) 70 - 99 mg/dL  GLUCOSE, CAPILLARY     Status: Abnormal   Collection Time    06/18/14  7:45 AM      Result Value Ref Range   Glucose-Capillary 121 (*) 70 - 99 mg/dL     Studies/Results: No results found.  . sodium chloride   Intravenous Once  . antiseptic oral rinse  7 mL Mouth Rinse QID  . chlorhexidine  15 mL Mouth Rinse BID  . dextrose  50 mL Intravenous Once  . glucagon (human recombinant)  1 mg Intravenous Once  . insulin aspart  0-9 Units Subcutaneous 6 times per day  . pantoprazole (PROTONIX) IV  40 mg Intravenous Q12H  . piperacillin-tazobactam (ZOSYN)  IV  3.375 g Intravenous Q8H  . sodium chloride  3 mL Intravenous Q12H     Assessment/Plan: s/p Procedure(s): Lumbar Wound Debridement with removal of hardware.  POD #8. Laparotomy and repair of perforated pyloric channel ulcer. Stable from general surgery standpoint Continue Protonix twice a day. May switch to oral form if tolerates diet. Do not give NSAIDS Advance diet and activities as tolerated   @  LOS: 8 days    Nur Krasinski M 06/18/2014  . .prob

## 2014-06-18 NOTE — Evaluation (Signed)
Physical Therapy Evaluation Patient Details Name: Devin [name] MRN: [id number] DOB: [birth year] Today's Date: 06/18/2014   History of Present Illness  Pt is a 43 yo M who has been undergoing treatment by the neurosurgery team for back issues. He underwent lumbar fusion earlier in the summer and had several evacuations of hematomas. He went to the hospital in [place] and was seen to have another fluid collection in his surgical site.  Since admission pt has undergone [name], repair of perforated pyloric channel ulcer on 06/10/14.  And most recently s/p Debridement of wound, removal of S1 pedicular screws.    Clinical Impression  Pt admitted with the above. Pt currently with functional limitations due to the deficits listed below (see PT Problem List). Pt limited due to overall pain and long hospital admission with recurrent surgeries resulting in generalized weakness and overall limited functional mobility.  Pt will benefit from skilled PT to increase their independence and safety with mobility to allow discharge to the venue listed below.      Follow Up Recommendations Home health PT;Supervision/Assistance - 24 hour    Equipment Recommendations  None recommended by PT    Recommendations for Other Services       Precautions / Restrictions Precautions Precautions: Back      Mobility  Bed Mobility Overal bed mobility: Needs Assistance Bed Mobility: Sidelying to Sit   Sidelying to sit: Min guard          Transfers Overall transfer level: Needs assistance Equipment used: Rolling walker (2 wheeled) Transfers: Sit to/from Stand Sit to Stand: Min assist            Ambulation/Gait Ambulation/Gait assistance: Min assist Ambulation Distance (Feet): 20 Feet Assistive device: Rolling walker (2 wheeled) Gait Pattern/deviations: Step-through pattern;Decreased stride length;Shuffle;Trunk flexed Gait velocity: decreased Gait velocity interpretation: >2.62  ft/sec, indicative of independent Company secretary Rankin (Stroke Patients Only)       Balance Overall balance assessment: Needs assistance Sitting-balance support: Bilateral upper extremity supported Sitting balance-Leahy Scale: Fair     Standing balance support: Bilateral upper extremity supported Standing balance-Leahy Scale: Fair                               Pertinent Vitals/Pain Pain Assessment: 0-10 Pain Score: 7  Pain Location: abdominal and back Pain Descriptors / Indicators: Dull Pain Intervention(s): Limited activity within patient's tolerance    Home Living Family/patient expects to be discharged to:: Private residence Living Arrangements: Alone Available Help at Discharge: Neighbor;Available PRN/intermittently Type of Home: Mobile home Home Access: Stairs to enter Entrance Stairs-Rails: Right;Left;Can reach both Entrance Stairs-Number of Steps: 9 Home Layout: One level Home Equipment: Walker - 2 wheels;Shower seat;Adaptive equipment      Prior Function Level of Independence: Needs assistance               Hand Dominance   Dominant Hand: Right    Extremity/Trunk Assessment   Upper Extremity Assessment: Defer to OT evaluation           Lower Extremity Assessment: Generalized weakness         Communication   Communication: No difficulties  Cognition Arousal/Alertness: Awake/alert Behavior During Therapy: WFL for tasks assessed/performed Overall Cognitive Status: Within Functional Limits for tasks assessed  General Comments      Exercises        Assessment/Plan    PT Assessment Patient needs continued PT services  PT Diagnosis Difficulty walking;Generalized weakness;Acute pain   PT Problem List Decreased strength;Decreased balance;Decreased mobility;Decreased knowledge of use of DME;Pain  PT Treatment Interventions DME  instruction;Gait training;Stair training;Functional mobility training;Therapeutic activities;Therapeutic exercise;Balance training;Patient/family education   PT Goals (Current goals can be found in the Care Plan section) Acute Rehab PT Goals Patient Stated Goal: To be able to return home and ambulate without pain PT Goal Formulation: With patient Potential to Achieve Goals: Good    Frequency Min 4X/week   Barriers to discharge        Co-evaluation               End of Session Equipment Utilized During Treatment: Gait belt Activity Tolerance: Patient tolerated treatment well Patient left: in chair;with call bell/phone within reach Nurse Communication: Mobility status;Precautions         Time: 0454-0981 PT Time Calculation (min): 23 min   Charges:   PT Evaluation $Initial PT Evaluation Tier I: 1 Procedure     PT G Codes:          Aryonna Gunnerson 06-19-14, 11:02 AM  Jake Shark, PT DPT [phone]

## 2014-06-19 LAB — GLUCOSE, CAPILLARY
GLUCOSE-CAPILLARY: 75 mg/dL (ref 70–99)
Glucose-Capillary: 103 mg/dL — ABNORMAL HIGH (ref 70–99)
Glucose-Capillary: 107 mg/dL — ABNORMAL HIGH (ref 70–99)
Glucose-Capillary: 113 mg/dL — ABNORMAL HIGH (ref 70–99)
Glucose-Capillary: 69 mg/dL — ABNORMAL LOW (ref 70–99)
Glucose-Capillary: 99 mg/dL (ref 70–99)

## 2014-06-19 MED ORDER — PANTOPRAZOLE SODIUM 40 MG PO TBEC
40.0000 mg | DELAYED_RELEASE_TABLET | Freq: Two times a day (BID) | ORAL | Status: DC
  Administered 2014-06-19 – 2014-07-02 (×26): 40 mg via ORAL
  Filled 2014-06-19 (×26): qty 1

## 2014-06-19 MED ORDER — VANCOMYCIN HCL IN DEXTROSE 1-5 GM/200ML-% IV SOLN
1000.0000 mg | Freq: Two times a day (BID) | INTRAVENOUS | Status: DC
  Administered 2014-06-19 – 2014-06-21 (×6): 1000 mg via INTRAVENOUS
  Filled 2014-06-19 (×7): qty 200

## 2014-06-19 NOTE — Progress Notes (Signed)
Post op Day # 9/#2 Subjective: Tolerating full liquid diet. Not much abdominal pain. Had a bowel movement. Primary complaint is leg pain.  Objective: Vital signs in last 24 hours: Temp:  [97.7 F (36.5 C)-98.6 F (37 C)] 98.4 F (36.9 C) (09/20 0551) Pulse Rate:  [84-96] 84 (09/20 0551) Resp:  [17-19] 18 (09/20 0551) BP: (133-144)/(70-89) 143/80 mmHg (09/20 0551) SpO2:  [96 %-99 %] 98 % (09/20 0551) Last BM Date: 06/18/14  Intake/Output from previous day: 09/19 0701 - 09/20 0700 In: 600 [P.O.:600] Out: 395 [Urine:375; Drains:20] Intake/Output this shift: Total I/O In: -  Out: 200 [Urine:200]  General appearance: alert. No distress.  Moves slowly in bed. GI: abdomen soft, nondistended, nontender, active bowel sounds. Negative pressure dressing in place.  Lab Results:  Results for orders placed during the hospital encounter of 06/10/14 (from the past 24 hour(s))  GLUCOSE, CAPILLARY     Status: Abnormal   Collection Time    06/18/14 11:56 AM      Result Value Ref Range   Glucose-Capillary 133 (*) 70 - 99 mg/dL  GLUCOSE, CAPILLARY     Status: Abnormal   Collection Time    06/18/14  4:19 PM      Result Value Ref Range   Glucose-Capillary 102 (*) 70 - 99 mg/dL  GLUCOSE, CAPILLARY     Status: Abnormal   Collection Time    06/18/14  7:59 PM      Result Value Ref Range   Glucose-Capillary 102 (*) 70 - 99 mg/dL   Comment 1 Notify RN     Comment 2 Documented in Chart    GLUCOSE, CAPILLARY     Status: None   Collection Time    06/18/14 11:58 PM      Result Value Ref Range   Glucose-Capillary 90  70 - 99 mg/dL   Comment 1 Notify RN     Comment 2 Documented in Chart    GLUCOSE, CAPILLARY     Status: Abnormal   Collection Time    06/19/14  3:49 AM      Result Value Ref Range   Glucose-Capillary 107 (*) 70 - 99 mg/dL   Comment 1 Notify RN     Comment 2 Documented in Chart    GLUCOSE, CAPILLARY     Status: Abnormal   Collection Time    06/19/14  7:57 AM      Result  Value Ref Range   Glucose-Capillary 69 (*) 70 - 99 mg/dL     Studies/Results: No results found.  . sodium chloride   Intravenous Once  . antiseptic oral rinse  7 mL Mouth Rinse QID  . chlorhexidine  15 mL Mouth Rinse BID  . dextrose  50 mL Intravenous Once  . glucagon (human recombinant)  1 mg Intravenous Once  . insulin aspart  0-9 Units Subcutaneous 6 times per day  . pantoprazole  40 mg Oral BID  . piperacillin-tazobactam (ZOSYN)  IV  3.375 g Intravenous Q8H  . sodium chloride  3 mL Intravenous Q12H  . vancomycin  1,000 mg Intravenous Q12H     Assessment/Plan: s/p Procedure(s): Lumbar Wound Debridement with removal of hardware.  POD #9.  Laparotomy and repair of perforated pyloric channel ulcer.  Stable from general surgery standpoint  Continue Protonix twice a day. Switched to oral form today Do not give NSAIDS  Advance diet and activities as tolerated  Remainder of care to be directed by [name].    @  LOS: 9  days    Devin [name] M 06/19/2014  . .prob

## 2014-06-19 NOTE — Progress Notes (Signed)
Patient ID: Devin [name], male   DOB: 1971/01/10, 43 y.o.   MRN: [id number] Stable, still draining. Pain under control

## 2014-06-19 NOTE — Progress Notes (Signed)
Physical Therapy Treatment Patient Details Name: Devin [name] MRN: [id number] DOB: 1971-02-19 Today's Date: 06/19/2014    History of Present Illness Pt is a 43 yo M who has been undergoing treatment by the neurosurgery team for back issues. He underwent lumbar fusion earlier in the summer and had several evacuations of hematomas. He went to the hospital in [place] and was seen to have another fluid collection in his surgical site.  Since admission pt has undergone [name], repair of perforated pyloric channel ulcer on 06/10/14.  And most recently s/p Debridement of wound, removal of S1 pedicular screws.      PT Comments    Making progress with mobility; Left a note asking Dr. Danielle [name] if he recommends a better fitting brace  Follow Up Recommendations  Home health PT;Supervision/Assistance - 24 hour     Equipment Recommendations  None recommended by PT    Recommendations for Other Services       Precautions / Restrictions Precautions Precautions: Back    Mobility  Bed Mobility Overal bed mobility: Needs Assistance Bed Mobility: Sidelying to Sit   Sidelying to sit: Min guard       General bed mobility comments: good use of rails and observation of back prec  Transfers Overall transfer level: Needs assistance Equipment used: Rolling walker (2 wheeled) Transfers: Sit to/from Stand Sit to Stand: Min assist         General transfer comment: Cues for technqiue, hand placement, and control  Ambulation/Gait Ambulation/Gait assistance: Min guard Ambulation Distance (Feet): 120 Feet Assistive device: Rolling walker (2 wheeled) Gait Pattern/deviations: Step-through pattern Gait velocity: decreased   General Gait Details: Cues for posture and to self-monitor for activity tolerance   Stairs            Wheelchair Mobility    Modified Rankin (Stroke Patients Only)       Balance     Sitting balance-Leahy Scale: Fair       Standing  balance-Leahy Scale: Fair                      Cognition Arousal/Alertness: Awake/alert Behavior During Therapy: WFL for tasks assessed/performed Overall Cognitive Status: Within Functional Limits for tasks assessed                      Exercises      General Comments        Pertinent Vitals/Pain Pain Assessment: 0-10 Pain Score: 8  Pain Location: abdomen and back Pain Descriptors / Indicators: Aching;Dull Pain Intervention(s): Limited activity within patient's tolerance;Monitored during session    Home Living                      Prior Function            PT Goals (current goals can now be found in the care plan section) Acute Rehab PT Goals Patient Stated Goal: To be able to return home and ambulate without pain PT Goal Formulation: With patient Potential to Achieve Goals: Good Progress towards PT goals: Progressing toward goals    Frequency  Min 4X/week    PT Plan Current plan remains appropriate    Co-evaluation             End of Session Equipment Utilized During Treatment: Gait belt Activity Tolerance: Patient tolerated treatment well Patient left: in chair;with call bell/phone within reach     Time: 6578-4696 PT Time Calculation (min): 23 min  Charges:  $Gait Training: 8-22 mins $Therapeutic Activity: 8-22 mins                    G Codes:      Devin [name] Devin [name] 06/19/2014, 6:09 PM  Devin [name], Devin [name]  Acute Rehabilitation Services Pager [phone] Office [phone]

## 2014-06-20 ENCOUNTER — Encounter (HOSPITAL_COMMUNITY): Payer: Self-pay | Admitting: Neurological Surgery

## 2014-06-20 LAB — BASIC METABOLIC PANEL
Anion gap: 12 (ref 5–15)
BUN: 5 mg/dL — ABNORMAL LOW (ref 6–23)
CO2: 24 mEq/L (ref 19–32)
Calcium: 7.8 mg/dL — ABNORMAL LOW (ref 8.4–10.5)
Chloride: 106 mEq/L (ref 96–112)
Creatinine, Ser: 0.73 mg/dL (ref 0.50–1.35)
Glucose, Bld: 99 mg/dL (ref 70–99)
POTASSIUM: 3.4 meq/L — AB (ref 3.7–5.3)
SODIUM: 142 meq/L (ref 137–147)

## 2014-06-20 LAB — GLUCOSE, CAPILLARY
GLUCOSE-CAPILLARY: 79 mg/dL (ref 70–99)
Glucose-Capillary: 81 mg/dL (ref 70–99)
Glucose-Capillary: 76 mg/dL (ref 70–99)
Glucose-Capillary: 94 mg/dL (ref 70–99)
Glucose-Capillary: 89 mg/dL (ref 70–99)
Glucose-Capillary: 123 mg/dL — ABNORMAL HIGH (ref 70–99)

## 2014-06-20 LAB — CBC
HCT: 20.8 % — ABNORMAL LOW (ref 39.0–52.0)
Hemoglobin: 6.3 g/dL — CL (ref 13.0–17.0)
MCH: 29.9 pg (ref 26.0–34.0)
MCHC: 30.3 g/dL (ref 30.0–36.0)
MCV: 98.6 fL (ref 78.0–100.0)
PLATELETS: 722 10*3/uL — AB (ref 150–400)
RBC: 2.11 MIL/uL — ABNORMAL LOW (ref 4.22–5.81)
RDW: 19.6 % — AB (ref 11.5–15.5)
WBC: 10.4 10*3/uL (ref 4.0–10.5)

## 2014-06-20 LAB — WOUND CULTURE: CULTURE: NO GROWTH

## 2014-06-20 LAB — PREPARE RBC (CROSSMATCH)

## 2014-06-20 MED ORDER — SODIUM CHLORIDE 0.9 % IV SOLN: Freq: Once | INTRAVENOUS | Status: DC

## 2014-06-20 NOTE — Progress Notes (Signed)
CRITICAL VALUE ALERT  Critical value received:  hemoglobin  Date of notification:  06/20/2014   Time of notification:  1444  Critical value read back:Yes.  Thomes Cake  Nurse who received alert:  Ishmael Holter  MD notified (1st page):  Barnetta Chapel, PA  Time of first page: 1447  MD notified (2nd page):Dr. Winfred Leeds  Time of second page:1530  Responding MD:  [name]  Time MD responded:  [phone]

## 2014-06-20 NOTE — Progress Notes (Addendum)
Patient ID: Devin [name], male   DOB: 05-04-1971, 43 y.o.   MRN: [id number] 3 Days Post-Op  Subjective: Pt c/o leg and back pain.  Ate some yesterday, but too salty and got nauseated.    Objective: Vital signs in last 24 hours: Temp:  [97.8 F (36.6 C)-98.9 F (37.2 C)] 98 F (36.7 C) (09/21 0643) Pulse Rate:  [84-95] 84 (09/21 0643) Resp:  [16-19] 16 (09/21 0643) BP: (144-152)/(81-91) 146/81 mmHg (09/21 0643) SpO2:  [98 %-100 %] 98 % (09/21 0643) Last BM Date: 06/18/14  Intake/Output from previous day: 09/20 0701 - 09/21 0700 In: 600 [P.O.:600] Out: 1472 [Urine:1350; Drains:120; Stool:2] Intake/Output this shift: Total I/O In: -  Out: 425 [Urine:425]  PE: Abd: soft, appropriately tender, VAC in place, some BS,   Lab Results:   Recent Labs  06/17/14 1130 06/17/14 1609 06/18/14 0054  WBC 10.0  --  9.9  HGB 8.2* 7.8* 7.0*  HCT 27.7* 23.0* 23.6*  PLT 602*  --  597*   BMET  Recent Labs  06/17/14 1609 06/18/14 0054  NA 141 141  K 3.7 4.6  CL  --  107  CO2  --  23  GLUCOSE 78 122*  BUN  --  5*  CREATININE  --  0.59  CALCIUM  --  7.7*   PT/INR No results found for this basename: LABPROT, INR,  in the last 72 hours CMP     Component Value Date/Time   NA 141 06/18/2014 0054   K 4.6 06/18/2014 0054   CL 107 06/18/2014 0054   CO2 23 06/18/2014 0054   GLUCOSE 122* 06/18/2014 0054   BUN 5* 06/18/2014 0054   CREATININE 0.59 06/18/2014 0054   CALCIUM 7.7* 06/18/2014 0054   PROT 5.7* 06/13/2014 0604   ALBUMIN 1.8* 06/13/2014 0604   AST 14 06/13/2014 0604   ALT 11 06/13/2014 0604   ALKPHOS 108 06/13/2014 0604   BILITOT 0.2* 06/13/2014 0604   GFRNONAA >90 06/18/2014 0054   GFRAA >90 06/18/2014 0054   Lipase  No results found for this basename: lipase       Studies/Results: No results found.  Anti-infectives: Anti-infectives   Start     Dose/Rate Route Frequency Ordered Stop   06/19/14 0300  vancomycin (VANCOCIN) IVPB 1000 mg/200 mL premix     1,000 mg 200  mL/hr over 60 Minutes Intravenous Every 12 hours 06/19/14 0231     06/19/14 0200  vancomycin (VANCOCIN) 500 mg in sodium chloride 0.9 % 100 mL IVPB  Status:  Discontinued     500 mg 100 mL/hr over 60 Minutes Intravenous Every 12 hours 06/18/14 1335 06/19/14 0231   06/18/14 1330  vancomycin (VANCOCIN) 2,000 mg in sodium chloride 0.9 % 500 mL IVPB     2,000 mg 250 mL/hr over 120 Minutes Intravenous  Once 06/18/14 1319 06/18/14 1602   06/17/14 1608  bacitracin 50,000 Units in sodium chloride irrigation 0.9 % 500 mL irrigation  Status:  Discontinued       As needed 06/17/14 1608 06/17/14 1708   06/11/14 0900  fluconazole (DIFLUCAN) IVPB 200 mg     200 mg 100 mL/hr over 60 Minutes Intravenous Every 24 hours 06/11/14 0832 06/17/14 1100   06/10/14 1900  vancomycin (VANCOCIN) 1,250 mg in sodium chloride 0.9 % 250 mL IVPB  Status:  Discontinued     1,250 mg 166.7 mL/hr over 90 Minutes Intravenous Every 8 hours 06/10/14 0927 06/12/14 0954   06/10/14 1700  piperacillin-tazobactam (ZOSYN) IVPB 3.375  g     3.375 g 12.5 mL/hr over 240 Minutes Intravenous Every 8 hours 06/10/14 0927     06/10/14 1100  vancomycin (VANCOCIN) 1,750 mg in sodium chloride 0.9 % 500 mL IVPB     1,750 mg 250 mL/hr over 120 Minutes Intravenous  Once 06/10/14 0927 06/10/14 1256   06/10/14 1030  piperacillin-tazobactam (ZOSYN) IVPB 3.375 g     3.375 g 100 mL/hr over 30 Minutes Intravenous  Once 06/10/14 0927 06/10/14 1030   06/10/14 0300  piperacillin-tazobactam (ZOSYN) IVPB 3.375 g     3.375 g 12.5 mL/hr over 240 Minutes Intravenous  Once 06/10/14 0249 06/10/14 0444       Assessment/Plan /p Procedure(s):  Lumbar Wound Debridement with removal of hardware.  POD #10. Laparotomy and repair of perforated pyloric channel ulcer.  Anemia  Plan: 1. Cont soft diet today 2. Evaluate wound at [facility] change today to see if he still needs wound VAC 3. Recheck hgb 4. Defer further care to NS   LOS: 10 days    Devin [name]  E 06/20/2014, 8:31 AM Pager: [phone]  ADDENDUM: wound VAC change performed.  Wound is shallow and clean.  Good beefy red granulation tissue.  Cont VAC while here, but will not need abdominal wound VAC to go home.

## 2014-06-20 NOTE — Anesthesia Postprocedure Evaluation (Signed)
Anesthesia Post Note  Patient: Devin [name]  Procedure(s) Performed: Procedure(s) (LRB): Lumbar Wound Debridement with removal of hardware. (N/A)  Anesthesia type: General  Patient location: PACU  Post pain: Pain level controlled and Adequate analgesia  Post assessment: Post-op Vital signs reviewed, Patient's Cardiovascular Status Stable, Respiratory Function Stable, Patent Airway and Pain level controlled  Last Vitals:  Filed Vitals:   06/20/14 0643  BP: 146/81  Pulse: 84  Temp: 36.7 C  Resp: 16    Post vital signs: Reviewed and stable  Level of consciousness: awake, alert  and oriented  Complications: No apparent anesthesia complications

## 2014-06-20 NOTE — Anesthesia Postprocedure Evaluation (Signed)
  Anesthesia Post-op Note  Patient: Devin [name]  Procedure(s) Performed: Procedure(s) with comments: EXPLORATORY LAPAROTOMY (N/A) - patch repair of perforated pyloric ulcer  Patient Location: PACU and SICU  Anesthesia Type:General  Level of Consciousness: sedated and Patient remains intubated per anesthesia plan  Airway and Oxygen Therapy: Patient remains intubated per anesthesia plan and Patient placed on Ventilator (see vital sign flow sheet for setting)  Post-op Pain: none  Post-op Assessment: Post-op Vital signs reviewed  Post-op Vital Signs: stable  Last Vitals:  Filed Vitals:   06/20/14 0643  BP: 146/81  Pulse: 84  Temp: 36.7 C  Resp: 16    Complications: No apparent anesthesia complications

## 2014-06-20 NOTE — Progress Notes (Signed)
Contrast study - no leak Tolerating soft diet. 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 Corliss Skains, MD, [facility] Surgery  General/ Trauma Surgery  06/20/2014 10:51 AM

## 2014-06-20 NOTE — Progress Notes (Signed)
PT Cancellation Note  Patient Details Name: Dannie Hattabaugh MRN: [id number] DOB: 07/02/1971   Cancelled Treatment:    Reason Eval/Treat Not Completed: Medical issues which prohibited therapy.  Pt's Hgb is 6.3 which is significantly lower than previous sessions and lower than our departmental standards for treatment.  Tried to call RN to make sure she was aware, but could not get a hold of her by phone.  PT will check back tomorrow.  Thanks,    Rollene Rotunda. Shaunika Italiano, PT, DPT [phone]   06/20/2014, 3:17 PM

## 2014-06-21 ENCOUNTER — Inpatient Hospital Stay (HOSPITAL_COMMUNITY): Payer: BC Managed Care – PPO

## 2014-06-21 ENCOUNTER — Encounter (HOSPITAL_COMMUNITY): Payer: Self-pay | Admitting: Infectious Diseases

## 2014-06-21 DIAGNOSIS — K255 Chronic or unspecified gastric ulcer with perforation: Secondary | ICD-10-CM

## 2014-06-21 DIAGNOSIS — D649 Anemia, unspecified: Secondary | ICD-10-CM

## 2014-06-21 DIAGNOSIS — Z654 Victim of crime and terrorism: Secondary | ICD-10-CM

## 2014-06-21 LAB — TYPE AND SCREEN
ABO/RH(D): O POS
Antibody Screen: NEGATIVE
UNIT DIVISION: 0
UNIT DIVISION: 0
Unit division: 0

## 2014-06-21 LAB — COMPREHENSIVE METABOLIC PANEL
ALT: 5 U/L (ref 0–53)
AST: 15 U/L (ref 0–37)
Albumin: 1.6 g/dL — ABNORMAL LOW (ref 3.5–5.2)
Alkaline Phosphatase: 112 U/L (ref 39–117)
Anion gap: 14 (ref 5–15)
BUN: 8 mg/dL (ref 6–23)
CALCIUM: 7.8 mg/dL — AB (ref 8.4–10.5)
CO2: 21 mEq/L (ref 19–32)
Chloride: 103 mEq/L (ref 96–112)
Creatinine, Ser: 0.59 mg/dL (ref 0.50–1.35)
GFR calc non Af Amer: >90 mL/min (ref >90)
Glucose, Bld: 86 mg/dL (ref 70–99)
Potassium: 3.6 mEq/L — ABNORMAL LOW (ref 3.7–5.3)
SODIUM: 138 meq/L (ref 137–147)
TOTAL PROTEIN: 7 g/dL (ref 6.0–8.3)
Total Bilirubin: 0.4 mg/dL (ref 0.3–1.2)

## 2014-06-21 LAB — GLUCOSE, CAPILLARY
GLUCOSE-CAPILLARY: 101 mg/dL — AB (ref 70–99)
GLUCOSE-CAPILLARY: 85 mg/dL (ref 70–99)
GLUCOSE-CAPILLARY: 82 mg/dL (ref 70–99)
Glucose-Capillary: 106 mg/dL — ABNORMAL HIGH (ref 70–99)
Glucose-Capillary: 113 mg/dL — ABNORMAL HIGH (ref 70–99)
Glucose-Capillary: 87 mg/dL (ref 70–99)

## 2014-06-21 LAB — VANCOMYCIN, TROUGH: VANCOMYCIN TR: 13.6 ug/mL (ref 10.0–20.0)

## 2014-06-21 LAB — TROPONIN I: Troponin I: <0.3 ng/mL (ref <0.30)

## 2014-06-21 MED ORDER — SODIUM CHLORIDE 0.9 % IV SOLN
1250.0000 mg | Freq: Two times a day (BID) | INTRAVENOUS | Status: DC
  Administered 2014-06-21 – 2014-06-27 (×12): 1250 mg via INTRAVENOUS
  Filled 2014-06-21 (×12): qty 1250

## 2014-06-21 MED ORDER — PRO-STAT SUGAR FREE PO LIQD
30.0000 mL | Freq: Two times a day (BID) | ORAL | Status: DC
  Administered 2014-06-22: 30 mL via ORAL
  Administered 2014-06-24: 18:00:00 via ORAL
  Administered 2014-06-25 – 2014-06-27 (×6): 30 mL via ORAL
  Administered 2014-06-28: 13:00:00 via ORAL
  Administered 2014-06-28 – 2014-07-02 (×8): 30 mL via ORAL
  Filled 2014-06-21 (×24): qty 30

## 2014-06-21 MED ORDER — ENSURE COMPLETE PO LIQD
237.0000 mL | Freq: Two times a day (BID) | ORAL | Status: DC
  Administered 2014-06-21 – 2014-06-24 (×5): 237 mL via ORAL

## 2014-06-21 MED ORDER — RIFAMPIN 300 MG PO CAPS
300.0000 mg | ORAL_CAPSULE | Freq: Every day | ORAL | Status: DC
  Administered 2014-06-21 – 2014-06-27 (×7): 300 mg via ORAL
  Filled 2014-06-21 (×7): qty 1

## 2014-06-21 NOTE — Progress Notes (Signed)
Devin [name]. Corliss Skains, MD, [facility] Surgery  General/ Trauma Surgery  06/21/2014 2:27 PM

## 2014-06-21 NOTE — Progress Notes (Signed)
OT Cancellation Note  Patient Details Name: Beauregard Jarrells MRN: [id number] DOB: 12/20/1970   Cancelled Treatment:    Reason Eval/Treat Not Completed: Other (comment). Pt not wanting to work with therapy due to nausea.  Earlie Raveling OTR/L [phone] 06/21/2014, 2:42 PM

## 2014-06-21 NOTE — Progress Notes (Signed)
ANTIBIOTIC CONSULT NOTE - Follow Up  Pharmacy Consult for vancomycin Indication: CSF culture positive for MRSA  Allergies  Allergen Reactions  . Codeine Other (See Comments)    Skin "crawl"  . Hydrocodone Other (See Comments)    Skin "crawl"  . Other Nausea Only and Other (See Comments)    Darvocet    Patient Measurements: Height:  (177.8 cm) Weight: 229 lb 0.9 oz (103.9 kg) IBW/kg (Calculated) : 73   Vital Signs: Temp: 98.9 F (37.2 C) (09/22 0609) Temp src: Oral (09/22 0609) BP: 139/77 mmHg (09/22 0609) Pulse Rate: 96 (09/22 0609) Intake/Output from previous day: 09/21 0701 - 09/22 0700 In: 337.5 [Blood:337.5] Out: 1925 [Urine:1925] Intake/Output from this shift: Total I/O In: 360 [P.O.:360] Out: 400 [Urine:250; Drains:150]  Labs:  Recent Labs  06/20/14 1250 06/20/14 1445  WBC 10.4  --   HGB 6.3*  --   PLT 722*  --   CREATININE  --  0.73   Estimated Creatinine Clearance: 140.9 ml/min (by C-G formula based on Cr of 0.73).  Recent Labs  06/21/14 1430  VANCOTROUGH 13.6     Microbiology: Recent Results (from the past 720 hour(s))  MRSA PCR SCREENING     Status: Abnormal   Collection Time    06/10/14  9:14 AM      Result Value Ref Range Status   MRSA by PCR POSITIVE (*) NEGATIVE Final   Comment:            The GeneXpert MRSA Assay (FDA     approved for NASAL specimens     only), is one component of a     comprehensive MRSA colonization     surveillance program. It is not     intended to diagnose MRSA     infection nor to guide or     monitor treatment for     MRSA infections.     RESULT CALLED TO, READ BACK BY AND VERIFIED WITH:     Ricci Barker RN 11:30 06/10/14 ([email or web address])  URINE CULTURE     Status: None   Collection Time    06/10/14  9:14 AM      Result Value Ref Range Status   Specimen Description URINE, CATHETERIZED   Final   Special Requests Normal   Final   Culture  Setup Time     Final   Value: 06/10/2014 17:14     Performed at  [facility] [facility] Count     Final   Value: NO GROWTH     Performed at [facility]   Culture     Final   Value: NO GROWTH     Performed at [facility]   Report Status 06/11/2014 FINAL   Final  CSF CULTURE     Status: None   Collection Time    06/14/14  6:40 PM      Result Value Ref Range Status   Specimen Description CSF   Final   Special Requests SPINAL FLUID IN CUP   Final   Gram Stain     Final   Value: ABUNDANT WBC PRESENT,BOTH PMN AND MONONUCLEAR     NO ORGANISMS SEEN     Performed at [facility]     Performed at [facility]   Culture     Final   Value: FEW METHICILLIN RESISTANT STAPHYLOCOCCUS AUREUS     Note: RIFAMPIN AND GENTAMICIN SHOULD NOT BE USED AS SINGLE DRUGS FOR TREATMENT  OF STAPH INFECTIONS. This organism DOES NOT demonstrate inducible Clindamycin resistance in vitro. CRITICAL RESULT CALLED TO, READ BACK BY AND VERIFIED WITH: TARA CROSS      06/17/14 1300 BY SMITHERSJ     Performed at [facility]   Report Status 06/17/2014 FINAL   Final   Organism ID, Bacteria METHICILLIN RESISTANT STAPHYLOCOCCUS AUREUS   Final  GRAM STAIN     Status: None   Collection Time    06/14/14  6:40 PM      Result Value Ref Range Status   Specimen Description CSF   Final   Special Requests SPINAL FLUID IN CUP   Final   Gram Stain     Final   Value: ABUNDANT WBC PRESENT,BOTH PMN AND MONONUCLEAR     NO ORGANISMS SEEN     [facility]   Report Status 06/14/2014 FINAL   Final  WOUND CULTURE     Status: None   Collection Time    06/17/14  4:57 PM      Result Value Ref Range Status   Specimen Description WOUND BACK   Final   Special Requests NONE   Final   Gram Stain     Final   Value: FEW WBC PRESENT,BOTH PMN AND MONONUCLEAR     NO SQUAMOUS EPITHELIAL CELLS SEEN     NO ORGANISMS SEEN     Performed at [facility]   Culture     Final   Value: NO GROWTH 2 DAYS     Performed at [facility]   Report Status  06/20/2014 FINAL   Final  ANAEROBIC CULTURE     Status: None   Collection Time    06/17/14  4:57 PM      Result Value Ref Range Status   Specimen Description WOUND BACK   Final   Special Requests NONE   Final   Gram Stain     Final   Value: FEW WBC PRESENT,BOTH PMN AND MONONUCLEAR     NO SQUAMOUS EPITHELIAL CELLS SEEN     NO ORGANISMS SEEN     Performed at [facility]   Culture     Final   Value: NO ANAEROBES ISOLATED; CULTURE IN PROGRESS FOR 5 DAYS     Performed at [facility]   Report Status PENDING   Incomplete    Medical History: Past Medical History  Diagnosis Date  . Diabetes mellitus without complication     diet contolled  only  . Hypertension   . Anxiety     Medications:  Scheduled:  . sodium chloride   Intravenous Once  . sodium chloride   Intravenous Once  . antiseptic oral rinse  7 mL Mouth Rinse QID  . chlorhexidine  15 mL Mouth Rinse BID  . dextrose  50 mL Intravenous Once  . feeding supplement (ENSURE COMPLETE)  237 mL Oral BID BM  . feeding supplement (PRO-STAT SUGAR FREE 64)  30 mL Oral BID WC  . glucagon (human recombinant)  1 mg Intravenous Once  . insulin aspart  0-9 Units Subcutaneous 6 times per day  . pantoprazole  40 mg Oral BID  . rifampin  300 mg Oral Daily  . sodium chloride  3 mL Intravenous Q12H  . [START ON 06/22/2014] vancomycin  1,250 mg Intravenous Q12H   Assessment: 43 yo man to start vancomycin for positive CSF culture for MRSA.  He is s/p OR for debridment of lumbar wound.  WBC wnl, afebrile,  renal function stable, current regimen vancomycin 1gm q12 with VT 13.6 < goal.    Goal of Therapy:  Vancomycin trough level 15-20 mcg/ml  Plan:  Increase vancomycin  IV q12  Thanks for allowing pharmacy to be a part of this patient's care.   Leota Sauers Pharm.D. CPP, BCPS Clinical Pharmacist [phone] 06/21/2014 6:04 PM

## 2014-06-21 NOTE — Progress Notes (Signed)
Devin [name]. Corliss Skains, MD, [facility] Surgery  General/ Trauma Surgery  06/21/2014 7:15 AM

## 2014-06-21 NOTE — Progress Notes (Signed)
Physical Therapy Treatment Patient Details Name: Devin [name] MRN: [id number] DOB: Feb 14, 1971 Today's Date: 06/21/2014    History of Present Illness Pt is a 43 yo M who has been undergoing treatment by the neurosurgery team for back issues. He underwent lumbar fusion earlier in the summer and had several evacuations of hematomas. He went to the hospital in [place] and was seen to have another fluid collection in his surgical site.  Since admission pt has undergone [name], repair of perforated pyloric channel ulcer on 06/10/14.  And most recently s/p Debridement of wound, removal of S1 pedicular screws.      PT Comments    Pt is limited by pain and nausea today, but agreeable for OOB to chair. Overall, he is min assist with RW for short distance gait.  We need to get him out in the hallway and test his endurance as well as start stair training.  Left note for [name] re: new brace vs. No brace as his current lumbar corsett is too large for him. PT will continue to follow acutely.   Follow Up Recommendations  Home health PT;Supervision/Assistance - 24 hour     Equipment Recommendations  None recommended by PT    Recommendations for Other Services   NA     Precautions / Restrictions Precautions Precautions: Back Precaution Comments: Verbally reviewed BAT, no bending arching twisting and added no lifting.   Required Braces or Orthoses: Other Brace/Splint Other Brace/Splint: pt has a lumbar corsette, but it is too large for him now.      Mobility   Transfers Overall transfer level: Needs assistance Equipment used: Rolling walker (2 wheeled) Transfers: Sit to/from Stand Sit to Stand: Min assist         General transfer comment: Min assist to support trunk during transitions.   Ambulation/Gait Ambulation/Gait assistance: Min assist Ambulation Distance (Feet): 10 Feet Assistive device: Rolling walker (2 wheeled) Gait Pattern/deviations: Step-through  pattern;Shuffle Gait velocity: decreased   General Gait Details: Min assist to support trunk over weak legs during short distance gait to recliner chair in the room.           Balance Overall balance assessment: Needs assistance Sitting-balance support: Feet supported;No upper extremity supported Sitting balance-Leahy Scale: Good     Standing balance support: Bilateral upper extremity supported Standing balance-Leahy Scale: Fair Standing balance comment: can let go of RW, but for dynamic movements needs assist and external support.                     Cognition Arousal/Alertness: Awake/alert Behavior During Therapy: WFL for tasks assessed/performed Overall Cognitive Status: Within Functional Limits for tasks assessed                         General Comments General comments (skin integrity, edema, etc.): Educated at length re: therapy progress, HHPT recommendation with transition to OP when MD allows.       Pertinent Vitals/Pain Pain Assessment: 0-10 Pain Score: 8  Pain Location: abdomen and back Pain Descriptors / Indicators: Aching;Burning Pain Intervention(s): Limited activity within patient's tolerance;Monitored during session;Repositioned           PT Goals (current goals can now be found in the care plan section) Acute Rehab PT Goals Patient Stated Goal: To be able to return home and ambulate without pain Progress towards PT goals: Progressing toward goals    Frequency  Min 4X/week    PT Plan Current  plan remains appropriate       End of Session   Activity Tolerance: Patient tolerated treatment well Patient left: in chair     Time: 8119-1478 PT Time Calculation (min): 19 min  Charges:  $Therapeutic Activity: 8-22 mins                     Veleria Barnhardt B. Tarrance Januszewski, PT, DPT [phone]   06/21/2014, 5:04 PM

## 2014-06-21 NOTE — Progress Notes (Signed)
NUTRITION FOLLOW UP  INTERVENTION: Ensure Complete po BID, each supplement provides 350 kcal and 13 grams of protein Prostat liquid protein po 30 ml BID with meals, each supplement provides 100 kcal, 15 grams protein RD to follow for nutrition care plan  NUTRITION DIAGNOSIS: Inadequate oral intake related to altered GI function as evidenced by PO intake 45-75%, improving  Goal: Pt to meet >/= 90% of their estimated nutrition needs, progressing   Monitor:  PO & supplemental intake, weight, labs, I/O's  ASSESSMENT: 43 yo M who underwent lumbar fusion earlier in the summer and had several evacuations of hematomas. Went to [facility] on 9/11, then transferred to [facility]. In ED, CT demonstrated free air and a perforated ulcer.  Patient s/p procedures 9/11: EXPLORATORY LAPAROTOMY REPAIR OF PERFORATED PYLORIC CHANNEL ULCER   Patient extubated 9/12.  Transferred to 6N-Surgical from [facility]M-[facility]U 9/11.  Chart reviewed.  Pt has been on and off Clear/Full Liquids since 9/16.  Advanced to solids 9/20.  PO intake variable at 45-75% per flowsheet records.  Nutrient needs increased given post-op & wound healing.  Would benefit from addition of oral nutrition supplements.  RD to order.  Height: Ht Readings from Last 1 Encounters:  06/10/14  (1.778 m)    Weight: Wt Readings from Last 1 Encounters:  06/12/14 229 lb 0.9 oz (103.9 kg)    BMI:  Body mass index is 32.87 kg/(m^2).   Re-estimated Nutritional Needs: Kcal: 2100-2300 Protein: 120-130 gm Fluid: 2.1-2.3 L  Skin: abdominal wound VAC  Diet Order: Carb Control   Intake/Output Summary (Last 24 hours) at 06/21/14 1401 Last data filed at 06/21/14 1200  Gross per 24 hour  Intake  697.5 ml  Output   1500 ml  Net -802.5 ml    Labs:   Recent Labs Lab 06/17/14 0430 06/17/14 1609 06/18/14 0054 06/20/14 1445  NA 142 141 141 142  K 3.2* 3.7 4.6 3.4*  CL 104  --  107 106  CO2 29  --  23 24  BUN 4*  --  5* 5*   CREATININE 0.71  --  0.59 0.73  CALCIUM 8.0*  --  7.7* 7.8*  GLUCOSE 89 78 122* 99    CBG (last 3)   Recent Labs  06/21/14 0344 06/21/14 0803 06/21/14 1311  GLUCAP 101* 85 82    Scheduled Meds: . sodium chloride   Intravenous Once  . sodium chloride   Intravenous Once  . antiseptic oral rinse  7 mL Mouth Rinse QID  . chlorhexidine  15 mL Mouth Rinse BID  . dextrose  50 mL Intravenous Once  . glucagon (human recombinant)  1 mg Intravenous Once  . insulin aspart  0-9 Units Subcutaneous 6 times per day  . pantoprazole  40 mg Oral BID  . sodium chloride  3 mL Intravenous Q12H  . vancomycin  1,000 mg Intravenous Q12H    Continuous Infusions: . sodium chloride 250 mL (06/20/14 0607)  . sodium chloride 75 mL/hr at 06/18/14 0540  . dextrose 5 % and 0.9% NaCl 50 mL/hr at 06/20/14 7829    Past Medical History  Diagnosis Date  . Diabetes mellitus without complication     diet contolled  only  . Hypertension   . Anxiety     Past Surgical History  Procedure Laterality Date  . Back surgery      x2 surgeries  . Nasal dilation    . Rotator cuff repair    . Posterior lumbar fusion 4 level  N/A 04/19/2014    Procedure: LUMBAR TWO-THREE,LUMBAR THREE-FOUR,LUMBAR FOUR-FIVE,LUMBAR FIVE-SACRAL-ONE POSTERIOR LUMBAR INTERBODY FUSION/ADD ON TO THORACIC TEN-LUMBAT TWO FUSION;  Surgeon: Barnett Abu, MD;  Location: [facility] NEURO ORS;  Service: Neurosurgery;  Laterality: N/A;  . Wound exploration N/A 05/11/2014    Procedure: Lumbar WOUND EXPLORATION, evcuation of hematoma;  Surgeon: Temple Pacini, MD;  Location: [facility] NEURO ORS;  Service: Neurosurgery;  Laterality: N/A;  . Lumbar laminectomy/decompression microdiscectomy N/A 05/16/2014    Procedure: Repeat evacuation of lumbar hematoma;  Surgeon: Barnett Abu, MD;  Location: [facility] NEURO ORS;  Service: Neurosurgery;  Laterality: N/A;  Repeat evacuation of lumbar hematoma  . Repair of perforated ulcer  06/10/2014  . Laparotomy N/A 06/10/2014    Procedure:  EXPLORATORY LAPAROTOMY;  Surgeon: Almond Lint, MD;  Location: [facility] OR;  Service: General;  Laterality: N/A;  patch repair of perforated pyloric ulcer  . Lumbar wound debridement N/A 06/17/2014    Procedure: Lumbar Wound Debridement with removal of hardware.;  Surgeon: Barnett Abu, MD;  Location: [facility] NEURO ORS;  Service: Neurosurgery;  Laterality: N/A;    Maureen Chatters, RD, LDN Pager #: [phone] After-Hours Pager #: [phone]

## 2014-06-21 NOTE — Progress Notes (Signed)
Patient ID: Devin [name], male   DOB: 09/29/1971, 43 y.o.   MRN: [id number] 4 Days Post-Op  Subjective: Pt doing well from abdominal standpoint.  +BM, tolerating his diet  Objective: Vital signs in last 24 hours: Temp:  [98.4 F (36.9 C)-98.9 F (37.2 C)] 98.9 F (37.2 C) (09/22 0609) Pulse Rate:  [83-96] 96 (09/22 0609) Resp:  [16-19] 18 (09/22 0609) BP: (139-151)/(72-91) 139/77 mmHg (09/22 0609) SpO2:  [99 %-100 %] 99 % (09/22 0609) Last BM Date: 06/18/14 ([id number])  Intake/Output from previous day: 09/21 0701 - 09/22 0700 In: 337.5 [Blood:337.5] Out: 1925 [Urine:1925] Intake/Output this shift:    PE: Abd: VAC in place, appropriately tender  Lab Results:   Recent Labs  06/20/14 1250  WBC 10.4  HGB 6.3*  HCT 20.8*  PLT 722*   BMET  Recent Labs  06/20/14 1445  NA 142  K 3.4*  CL 106  CO2 24  GLUCOSE 99  BUN 5*  CREATININE 0.73  CALCIUM 7.8*   PT/INR No results found for this basename: LABPROT, INR,  in the last 72 hours CMP     Component Value Date/Time   NA 142 06/20/2014 1445   K 3.4* 06/20/2014 1445   CL 106 06/20/2014 1445   CO2 24 06/20/2014 1445   GLUCOSE 99 06/20/2014 1445   BUN 5* 06/20/2014 1445   CREATININE 0.73 06/20/2014 1445   CALCIUM 7.8* 06/20/2014 1445   PROT 5.7* 06/13/2014 0604   ALBUMIN 1.8* 06/13/2014 0604   AST 14 06/13/2014 0604   ALT 11 06/13/2014 0604   ALKPHOS 108 06/13/2014 0604   BILITOT 0.2* 06/13/2014 0604   GFRNONAA >90 06/20/2014 1445   GFRAA >90 06/20/2014 1445   Lipase  No results found for this basename: lipase       Studies/Results: No results found.  Anti-infectives: Anti-infectives   Start     Dose/Rate Route Frequency Ordered Stop   06/19/14 0300  vancomycin (VANCOCIN) IVPB 1000 mg/200 mL premix     1,000 mg 200 mL/hr over 60 Minutes Intravenous Every 12 hours 06/19/14 0231     06/19/14 0200  vancomycin (VANCOCIN) 500 mg in sodium chloride 0.9 % 100 mL IVPB  Status:  Discontinued     500 mg 100 mL/hr over  60 Minutes Intravenous Every 12 hours 06/18/14 1335 06/19/14 0231   06/18/14 1330  vancomycin (VANCOCIN) 2,000 mg in sodium chloride 0.9 % 500 mL IVPB     2,000 mg 250 mL/hr over 120 Minutes Intravenous  Once 06/18/14 1319 06/18/14 1602   06/17/14 1608  bacitracin 50,000 Units in sodium chloride irrigation 0.9 % 500 mL irrigation  Status:  Discontinued       As needed 06/17/14 1608 06/17/14 1708   06/11/14 0900  fluconazole (DIFLUCAN) IVPB 200 mg     200 mg 100 mL/hr over 60 Minutes Intravenous Every 24 hours 06/11/14 0832 06/17/14 1100   06/10/14 1900  vancomycin (VANCOCIN) 1,250 mg in sodium chloride 0.9 % 250 mL IVPB  Status:  Discontinued     1,250 mg 166.7 mL/hr over 90 Minutes Intravenous Every 8 hours 06/10/14 0927 06/12/14 0954   06/10/14 1700  piperacillin-tazobactam (ZOSYN) IVPB 3.375 g  Status:  Discontinued     3.375 g 12.5 mL/hr over 240 Minutes Intravenous Every 8 hours 06/10/14 0927 06/20/14 1050   06/10/14 1100  vancomycin (VANCOCIN) 1,750 mg in sodium chloride 0.9 % 500 mL IVPB     1,750 mg 250 mL/hr over 120 Minutes Intravenous  Once 06/10/14 0927 06/10/14 1256   06/10/14 1030  piperacillin-tazobactam (ZOSYN) IVPB 3.375 g     3.375 g 100 mL/hr over 30 Minutes Intravenous  Once 06/10/14 0927 06/10/14 1030   06/10/14 0300  piperacillin-tazobactam (ZOSYN) IVPB 3.375 g     3.375 g 12.5 mL/hr over 240 Minutes Intravenous  Once 06/10/14 0249 06/10/14 0444       Assessment/Plan POD #11. Laparotomy and repair of perforated pyloric channel ulcer.  Anemia  Plan: 1. Patient surgically doing well.  No changes today.  Will look at wound with VAC change tomorrow.  LOS: 11 days    Kassidy Frankson E 06/21/2014, 10:42 AM Pager: [phone]

## 2014-06-21 NOTE — Consult Note (Signed)
[facility]  Date of Admission:  06/10/2014  Date of Consult:  06/21/2014  Reason for Consult: Infected Hematoma Referring Physician: Ellene Route  Impression/Recommendation Infected Hematoma Perforated Ulcer Anemia DM2 Musculoskeletal pain?   Would Continue vanco Add rifampin Check CMP Check HIV, hepatitis panel Recheck A1C Will most likely need PIC Will defer eval of his anterior chest pain to his primary.   Comment- Spoke with pt about getting PIC placed in the area of his tattoos. He is aware and states that his tattoos can't be hurt.   Thank you so much for this interesting consult,   Bobby Rumpf (pager) [phone] [email or web address]  Devin [name] is an 43 y.o. male.  HPI: 43 yo M with DM2 (since 2009) and previous admission to [facility] on 04-19-14 and had T10 to S1 fusion.  He returned to the hospital on 05-11-14 with increased pain and was found to have a hematoma (Cx -). He underwent I & D of this on 05-16-14. Cx (-). He was d/c home on 8-20.  He returned on 8-24 with increasing pain and a fluid collection at his wound site. He had a normal WBC, was afebrile and his f/u MRI showed decrease in size of fluid collection. He was also started on high dose of decadron.   On 06-10-14, he returned to the hospital with abd pain. He was found to have free air and a perforated ulcer on his CT.  He underwent exp-lap and repair of pyloric channel ulcer.  He was treated with zosyn/diflucan for 7 days. He was noted on his abd CT to have loosening of his sacral screws. He was also noted to have ESR 115 and CRP 6. He had 110cc of purulent fluid drained 9-15. His Cx grew MRSA. He returned to OR on 9-18 and underwent removal of S1 screws, debridement of wound. Cx (-).    Past Medical History  Diagnosis Date  . Diabetes mellitus without complication     diet contolled  only  . Hypertension   . Anxiety     Past Surgical History  Procedure Laterality Date    . Back surgery      x2 surgeries  . Nasal dilation    . Rotator cuff repair    . Posterior lumbar fusion 4 level N/A 04/19/2014    Procedure: LUMBAR TWO-THREE,LUMBAR THREE-FOUR,LUMBAR FOUR-FIVE,LUMBAR FIVE-SACRAL-ONE POSTERIOR LUMBAR INTERBODY FUSION/ADD ON TO THORACIC TEN-LUMBAT TWO FUSION;  Surgeon: Kristeen Miss, MD;  Location: [place] [facility];  Service: Neurosurgery;  Laterality: N/A;  . Wound exploration N/A 05/11/2014    Procedure: Lumbar WOUND EXPLORATION, evcuation of hematoma;  Surgeon: Charlie Pitter, MD;  Location: [place] [facility];  Service: Neurosurgery;  Laterality: N/A;  . Lumbar laminectomy/decompression microdiscectomy N/A 05/16/2014    Procedure: Repeat evacuation of lumbar hematoma;  Surgeon: Kristeen Miss, MD;  Location: [facility];  Service: Neurosurgery;  Laterality: N/A;  Repeat evacuation of lumbar hematoma  . Repair of perforated ulcer  06/10/2014  . Laparotomy N/A 06/10/2014    Procedure: EXPLORATORY LAPAROTOMY;  Surgeon: Stark Klein, MD;  Location: [place];  Service: General;  Laterality: N/A;  patch repair of perforated pyloric ulcer  . Lumbar wound debridement N/A 06/17/2014    Procedure: Lumbar Wound Debridement with removal of hardware.;  Surgeon: Kristeen Miss, MD;  Location: [place] [facility];  Service: Neurosurgery;  Laterality: N/A;     Allergies  Allergen Reactions  . Codeine Other (See Comments)    Skin "crawl"  .  Hydrocodone Other (See Comments)    Skin "crawl"  . Other Nausea Only and Other (See Comments)    Darvocet    Medications:  Scheduled: . sodium chloride   Intravenous Once  . sodium chloride   Intravenous Once  . antiseptic oral rinse  7 mL Mouth Rinse QID  . chlorhexidine  15 mL Mouth Rinse BID  . dextrose  50 mL Intravenous Once  . feeding supplement (ENSURE COMPLETE)  237 mL Oral BID BM  . feeding supplement (PRO-STAT SUGAR FREE 64)  30 mL Oral BID WC  . glucagon (human recombinant)  1 mg Intravenous Once  . insulin aspart  0-9 Units Subcutaneous 6  times per day  . pantoprazole  40 mg Oral BID  . sodium chloride  3 mL Intravenous Q12H  . vancomycin  1,000 mg Intravenous Q12H    Abtx:  Anti-infectives   Start     Dose/Rate Route Frequency Ordered Stop   06/19/14 0300  vancomycin (VANCOCIN) IVPB 1000 mg/200 mL premix     1,000 mg 200 mL/hr over 60 Minutes Intravenous Every 12 hours 06/19/14 0231     06/19/14 0200  vancomycin (VANCOCIN) 500 mg in sodium chloride 0.9 % 100 mL IVPB  Status:  Discontinued     500 mg 100 mL/hr over 60 Minutes Intravenous Every 12 hours 06/18/14 1335 06/19/14 0231   06/18/14 1330  vancomycin (VANCOCIN) 2,000 mg in sodium chloride 0.9 % 500 mL IVPB     2,000 mg 250 mL/hr over 120 Minutes Intravenous  Once 06/18/14 1319 06/18/14 1602   06/17/14 1608  bacitracin 50,000 Units in sodium chloride irrigation 0.9 % 500 mL irrigation  Status:  Discontinued       As needed 06/17/14 1608 06/17/14 1708   06/11/14 0900  fluconazole (DIFLUCAN) IVPB 200 mg     200 mg 100 mL/hr over 60 Minutes Intravenous Every 24 hours 06/11/14 0832 06/17/14 1100   06/10/14 1900  vancomycin (VANCOCIN) 1,250 mg in sodium chloride 0.9 % 250 mL IVPB  Status:  Discontinued     1,250 mg 166.7 mL/hr over 90 Minutes Intravenous Every 8 hours 06/10/14 0927 06/12/14 0954   06/10/14 1700  piperacillin-tazobactam (ZOSYN) IVPB 3.375 g  Status:  Discontinued     3.375 g 12.5 mL/hr over 240 Minutes Intravenous Every 8 hours 06/10/14 0927 06/20/14 1050   06/10/14 1100  vancomycin (VANCOCIN) 1,750 mg in sodium chloride 0.9 % 500 mL IVPB     1,750 mg 250 mL/hr over 120 Minutes Intravenous  Once 06/10/14 0927 06/10/14 1256   06/10/14 1030  piperacillin-tazobactam (ZOSYN) IVPB 3.375 g     3.375 g 100 mL/hr over 30 Minutes Intravenous  Once 06/10/14 0927 06/10/14 1030   06/10/14 0300  piperacillin-tazobactam (ZOSYN) IVPB 3.375 g     3.375 g 12.5 mL/hr over 240 Minutes Intravenous  Once 06/10/14 0249 06/10/14 0444      Total days of antibiotics    9-19 vancomycin 9-11 vancomycin 9-13 9-11 zosyn 9-21 9-12 Diflucan 9-18          Social History:  reports that he has been smoking Cigarettes.  He has a 20 pack-year smoking history. He has quit using smokeless tobacco. He reports that he does not drink alcohol or use illicit drugs.  Family History  Problem Relation Age of Onset  . Diabetes Mother   . Cancer Father     lung    General ROS: normal BM, normal urination, numbness in feet, chest/muscle pain  L anterior chest/radiating to back. see HPI  Blood pressure 139/77, pulse 96, temperature 98.9 F (37.2 C), temperature source Oral, resp. rate 18, height _0  (1.778 m), weight 103.9 kg (229 lb 0.9 oz), SpO2 99.00%. General appearance: alert and no distress Eyes: negative findings: conjunctivae and sclerae normal and pupils equal, round, reactive to light and accomodation Throat: lips, mucosa, and tongue normal; teeth and gums normal Neck: no adenopathy and supple, symmetrical, trachea midline Lungs: clear to auscultation bilaterally Heart: regular rate and rhythm Abdomen: normal findings: bowel sounds normal and soft, non-tender and midline wound with vac in midline.  Skin: trace edema.  or wond on mid back with drain in place.    Results for orders placed during the hospital encounter of 06/10/14 (from the past 48 hour(s))  GLUCOSE, CAPILLARY     Status: Abnormal   Collection Time    06/19/14  6:03 PM      Result Value Ref Range   Glucose-Capillary 113 (*) 70 - 99 mg/dL  GLUCOSE, CAPILLARY     Status: None   Collection Time    06/19/14  8:04 PM      Result Value Ref Range   Glucose-Capillary 99  70 - 99 mg/dL   Comment 1 Notify RN     Comment 2 Documented in Chart    GLUCOSE, CAPILLARY     Status: Abnormal   Collection Time    06/19/14 11:51 PM      Result Value Ref Range   Glucose-Capillary 103 (*) 70 - 99 mg/dL   Comment 1 Notify RN     Comment 2 Documented in Chart    GLUCOSE, CAPILLARY     Status: None    Collection Time    06/20/14  4:01 AM      Result Value Ref Range   Glucose-Capillary 81  70 - 99 mg/dL   Comment 1 Documented in Chart     Comment 2 Notify RN    GLUCOSE, CAPILLARY     Status: None   Collection Time    06/20/14  7:48 AM      Result Value Ref Range   Glucose-Capillary 76  70 - 99 mg/dL  GLUCOSE, CAPILLARY     Status: None   Collection Time    06/20/14 12:10 PM      Result Value Ref Range   Glucose-Capillary 94  70 - 99 mg/dL  CBC     Status: Abnormal   Collection Time    06/20/14 12:50 PM      Result Value Ref Range   WBC 10.4  4.0 - 10.5 K/uL   RBC 2.11 (*) 4.22 - 5.81 MIL/uL   Hemoglobin 6.3 (*) 13.0 - 17.0 g/dL   Comment: REPEATED TO VERIFY     CRITICAL RESULT CALLED TO, READ BACK BY AND VERIFIED WITH:     FOUST,B RN 1446 06/20/14 LEONARD,A   HCT 20.8 (*) 39.0 - 52.0 %   MCV 98.6  78.0 - 100.0 fL   MCH 29.9  26.0 - 34.0 pg   MCHC 30.3  30.0 - 36.0 g/dL   RDW 19.6 (*) 11.5 - 15.5 %   Platelets 722 (*) 150 - 400 K/uL  BASIC METABOLIC PANEL     Status: Abnormal   Collection Time    06/20/14  2:45 PM      Result Value Ref Range   Sodium 142  137 - 147 mEq/L   Potassium 3.4 (*) 3.7 - 5.3 mEq/L  Chloride 106  96 - 112 mEq/L   CO2 24  19 - 32 mEq/L   Glucose, Bld 99  70 - 99 mg/dL   BUN 5 (*) 6 - 23 mg/dL   Creatinine, Ser 0.73  0.50 - 1.35 mg/dL   Calcium 7.8 (*) 8.4 - 10.5 mg/dL   GFR calc non Af Amer >90  >90 mL/min   GFR calc Af Amer >90  >90 mL/min   Comment: (NOTE)     The eGFR has been calculated using the CKD EPI equation.     This calculation has not been validated in all clinical situations.     eGFR's persistently <90 mL/min signify possible Chronic Kidney     Disease.   Anion gap 12  5 - 15  GLUCOSE, CAPILLARY     Status: None   Collection Time    06/20/14  4:30 PM      Result Value Ref Range   Glucose-Capillary 89  70 - 99 mg/dL  TYPE AND SCREEN     Status: None   Collection Time    06/20/14  5:17 PM      Result Value Ref Range    ABO/RH(D) O POS     Antibody Screen NEG     Sample Expiration 06/23/2014     Unit Number [id number]     Blood Component Type RED CELLS,LR     Unit division 00     Status of Unit ISSUED,FINAL     Transfusion Status OK TO TRANSFUSE     Crossmatch Result Compatible     Unit Number [id number]     Blood Component Type RED CELLS,LR     Unit division 00     Status of Unit ISSUED     Transfusion Status OK TO TRANSFUSE     Crossmatch Result Compatible    GLUCOSE, CAPILLARY     Status: Abnormal   Collection Time    06/20/14  7:59 PM      Result Value Ref Range   Glucose-Capillary 123 (*) 70 - 99 mg/dL   Comment 1 Notify RN    PREPARE RBC (CROSSMATCH)     Status: None   Collection Time    06/20/14  8:30 PM      Result Value Ref Range   Order Confirmation ORDER PROCESSED BY BLOOD BANK    GLUCOSE, CAPILLARY     Status: None   Collection Time    06/20/14 11:53 PM      Result Value Ref Range   Glucose-Capillary 87  70 - 99 mg/dL  GLUCOSE, CAPILLARY     Status: Abnormal   Collection Time    06/21/14  3:44 AM      Result Value Ref Range   Glucose-Capillary 101 (*) 70 - 99 mg/dL  GLUCOSE, CAPILLARY     Status: None   Collection Time    06/21/14  8:03 AM      Result Value Ref Range   Glucose-Capillary 85  70 - 99 mg/dL  GLUCOSE, CAPILLARY     Status: None   Collection Time    06/21/14  1:11 PM      Result Value Ref Range   Glucose-Capillary 82  70 - 99 mg/dL  VANCOMYCIN, TROUGH     Status: None   Collection Time    06/21/14  2:30 PM      Result Value Ref Range   Vancomycin Tr 13.6  10.0 - 20.0 ug/mL      Component  Value Date/Time   SDES WOUND BACK 06/17/2014 1657   SDES WOUND BACK 06/17/2014 1657   SPECREQUEST NONE 06/17/2014 [place] 06/17/2014 1657   CULT  Value: NO GROWTH 2 DAYS Performed at [facility] 06/17/2014 1657   CULT  Value: NO ANAEROBES ISOLATED; CULTURE IN PROGRESS FOR 5 DAYS Performed at [facility] 06/17/2014 1657   REPTSTATUS  06/20/2014 FINAL 06/17/2014 1657   REPTSTATUS PENDING 06/17/2014 1657   No results found. Recent Results (from the past 240 hour(s))  CSF CULTURE     Status: None   Collection Time    06/14/14  6:40 PM      Result Value Ref Range Status   Specimen Description CSF   Final   Special Requests SPINAL FLUID IN CUP   Final   Gram Stain     Final   Value: ABUNDANT WBC PRESENT,BOTH PMN AND MONONUCLEAR     NO ORGANISMS SEEN     Performed at [facility]     Performed at [facility]   Culture     Final   Value: FEW METHICILLIN RESISTANT STAPHYLOCOCCUS AUREUS     Note: RIFAMPIN AND GENTAMICIN SHOULD NOT BE USED AS SINGLE DRUGS FOR TREATMENT OF STAPH INFECTIONS. This organism DOES NOT demonstrate inducible Clindamycin resistance in vitro. CRITICAL RESULT CALLED TO, READ BACK BY AND VERIFIED WITH: TARA CROSS      06/17/14 1300 BY SMITHERSJ     Performed at [facility]   Report Status 06/17/2014 FINAL   Final   Organism ID, Bacteria METHICILLIN RESISTANT STAPHYLOCOCCUS AUREUS   Final  GRAM STAIN     Status: None   Collection Time    06/14/14  6:40 PM      Result Value Ref Range Status   Specimen Description CSF   Final   Special Requests SPINAL FLUID IN CUP   Final   Gram Stain     Final   Value: ABUNDANT WBC PRESENT,BOTH PMN AND MONONUCLEAR     NO ORGANISMS SEEN     DIRECT SMEAR   Report Status 06/14/2014 FINAL   Final  WOUND CULTURE     Status: None   Collection Time    06/17/14  4:57 PM      Result Value Ref Range Status   Specimen Description WOUND BACK   Final   Special Requests NONE   Final   Gram Stain     Final   Value: FEW WBC PRESENT,BOTH PMN AND MONONUCLEAR     NO SQUAMOUS EPITHELIAL CELLS SEEN     NO ORGANISMS SEEN     Performed at [facility]   Culture     Final   Value: NO GROWTH 2 DAYS     Performed at [facility]   Report Status 06/20/2014 FINAL   Final  ANAEROBIC CULTURE     Status: None   Collection Time    06/17/14  4:57  PM      Result Value Ref Range Status   Specimen Description WOUND BACK   Final   Special Requests NONE   Final   Gram Stain     Final   Value: FEW WBC PRESENT,BOTH PMN AND MONONUCLEAR     NO SQUAMOUS EPITHELIAL CELLS SEEN     NO ORGANISMS SEEN     Performed at [facility]   Culture     Final   Value: NO ANAEROBES ISOLATED; CULTURE IN PROGRESS  FOR 5 DAYS     Performed at [facility]   Report Status PENDING   Incomplete      06/21/2014, 3:52 PM     LOS: 11 days

## 2014-06-22 DIAGNOSIS — T148XXA Other injury of unspecified body region, initial encounter: Secondary | ICD-10-CM

## 2014-06-22 DIAGNOSIS — M869 Osteomyelitis, unspecified: Secondary | ICD-10-CM

## 2014-06-22 LAB — GRAM STAIN

## 2014-06-22 LAB — TYPE AND SCREEN
ABO/RH(D): O POS
ANTIBODY SCREEN: NEGATIVE
UNIT DIVISION: 0
Unit division: 0

## 2014-06-22 LAB — CBC
HEMATOCRIT: 28.1 % — AB (ref 39.0–52.0)
Hemoglobin: 8.5 g/dL — ABNORMAL LOW (ref 13.0–17.0)
MCH: 27.5 pg (ref 26.0–34.0)
MCHC: 30.2 g/dL (ref 30.0–36.0)
MCV: 90.9 fL (ref 78.0–100.0)
Platelets: 837 10*3/uL — ABNORMAL HIGH (ref 150–400)
RBC: 3.09 MIL/uL — ABNORMAL LOW (ref 4.22–5.81)
RDW: 20.7 % — AB (ref 11.5–15.5)
WBC: 14 10*3/uL — AB (ref 4.0–10.5)

## 2014-06-22 LAB — GLUCOSE, CAPILLARY
GLUCOSE-CAPILLARY: 108 mg/dL — AB (ref 70–99)
GLUCOSE-CAPILLARY: 90 mg/dL (ref 70–99)
Glucose-Capillary: 109 mg/dL — ABNORMAL HIGH (ref 70–99)
Glucose-Capillary: 120 mg/dL — ABNORMAL HIGH (ref 70–99)
Glucose-Capillary: 127 mg/dL — ABNORMAL HIGH (ref 70–99)
Glucose-Capillary: 136 mg/dL — ABNORMAL HIGH (ref 70–99)

## 2014-06-22 LAB — HEPATITIS PANEL, ACUTE
HCV AB: NEGATIVE
HEP B C IGM: NONREACTIVE
HEP B S AG: NEGATIVE
Hep A IgM: NONREACTIVE

## 2014-06-22 LAB — HEMOGLOBIN A1C
Hgb A1c MFr Bld: 5.1 % (ref <5.7)
Mean Plasma Glucose: 100 mg/dL (ref <117)

## 2014-06-22 LAB — ANAEROBIC CULTURE

## 2014-06-22 LAB — HIV ANTIBODY (ROUTINE TESTING W REFLEX): HIV 1&2 Ab, 4th Generation: NONREACTIVE

## 2014-06-22 LAB — WOUND CULTURE

## 2014-06-22 MED ORDER — DIAZEPAM 5 MG PO TABS
5.0000 mg | ORAL_TABLET | Freq: Four times a day (QID) | ORAL | Status: DC | PRN
  Administered 2014-06-22 – 2014-07-01 (×15): 5 mg via ORAL
  Filled 2014-06-22 (×16): qty 1

## 2014-06-22 MED ORDER — SODIUM CHLORIDE 0.9 % IJ SOLN
10.0000 mL | INTRAMUSCULAR | Status: DC | PRN
  Administered 2014-06-26 – 2014-07-02 (×9): 10 mL

## 2014-06-22 NOTE — Progress Notes (Signed)
INFECTIOUS DISEASE PROGRESS NOTE  ID: Devin [name] is a 43 y.o. male with  Active Problems:   Perforated gastric ulcer   Acute respiratory failure, unspecified whether with hypoxia or hypercapnia   Peritonitis (acute) generalized   Pain   AKI (acute kidney injury)   DM type 2 (diabetes mellitus, type 2)  Subjective: C/o anxiety re: PIC line.   Abtx:  Anti-infectives   Start     Dose/Rate Route Frequency Ordered Stop   06/22/14 0000  vancomycin (VANCOCIN) 1,250 mg in sodium chloride 0.9 % 250 mL IVPB     1,250 mg 166.7 mL/hr over 90 Minutes Intravenous Every 12 hours 06/21/14 1706     06/21/14 1700  rifampin (RIFADIN) capsule 300 mg     300 mg Oral Daily 06/21/14 1557     06/19/14 0300  vancomycin (VANCOCIN) IVPB 1000 mg/200 mL premix  Status:  Discontinued     1,000 mg 200 mL/hr over 60 Minutes Intravenous Every 12 hours 06/19/14 0231 06/21/14 1706   06/19/14 0200  vancomycin (VANCOCIN) 500 mg in sodium chloride 0.9 % 100 mL IVPB  Status:  Discontinued     500 mg 100 mL/hr over 60 Minutes Intravenous Every 12 hours 06/18/14 1335 06/19/14 0231   06/18/14 1330  vancomycin (VANCOCIN) 2,000 mg in sodium chloride 0.9 % 500 mL IVPB     2,000 mg 250 mL/hr over 120 Minutes Intravenous  Once 06/18/14 1319 06/18/14 1602   06/17/14 1608  bacitracin 50,000 Units in sodium chloride irrigation 0.9 % 500 mL irrigation  Status:  Discontinued       As needed 06/17/14 1608 06/17/14 1708   06/11/14 0900  fluconazole (DIFLUCAN) IVPB 200 mg     200 mg 100 mL/hr over 60 Minutes Intravenous Every 24 hours 06/11/14 0832 06/17/14 1100   06/10/14 1900  vancomycin (VANCOCIN) 1,250 mg in sodium chloride 0.9 % 250 mL IVPB  Status:  Discontinued     1,250 mg 166.7 mL/hr over 90 Minutes Intravenous Every 8 hours 06/10/14 0927 06/12/14 0954   06/10/14 1700  piperacillin-tazobactam (ZOSYN) IVPB 3.375 g  Status:  Discontinued     3.375 g 12.5 mL/hr over 240 Minutes Intravenous Every 8 hours 06/10/14 0927  06/20/14 1050   06/10/14 1100  vancomycin (VANCOCIN) 1,750 mg in sodium chloride 0.9 % 500 mL IVPB     1,750 mg 250 mL/hr over 120 Minutes Intravenous  Once 06/10/14 0927 06/10/14 1256   06/10/14 1030  piperacillin-tazobactam (ZOSYN) IVPB 3.375 g     3.375 g 100 mL/hr over 30 Minutes Intravenous  Once 06/10/14 0927 06/10/14 1030   06/10/14 0300  piperacillin-tazobactam (ZOSYN) IVPB 3.375 g     3.375 g 12.5 mL/hr over 240 Minutes Intravenous  Once 06/10/14 0249 06/10/14 0444      Medications:  Scheduled: . sodium chloride   Intravenous Once  . sodium chloride   Intravenous Once  . antiseptic oral rinse  7 mL Mouth Rinse QID  . chlorhexidine  15 mL Mouth Rinse BID  . dextrose  50 mL Intravenous Once  . feeding supplement (ENSURE COMPLETE)  237 mL Oral BID BM  . feeding supplement (PRO-STAT SUGAR FREE 64)  30 mL Oral BID WC  . glucagon (human recombinant)  1 mg Intravenous Once  . insulin aspart  0-9 Units Subcutaneous 6 times per day  . pantoprazole  40 mg Oral BID  . rifampin  300 mg Oral Daily  . sodium chloride  3 mL Intravenous Q12H  .  vancomycin  1,250 mg Intravenous Q12H    Objective: Vital signs in last 24 hours: Temp:  [98.3 F (36.8 C)-98.9 F (37.2 C)] 98.3 F (36.8 C) (09/23 1356) Pulse Rate:  [98-102] 102 (09/23 1356) Resp:  [16-17] 16 (09/23 1356) BP: (131-136)/(79-86) 131/82 mmHg (09/23 1356) SpO2:  [99 %-100 %] 99 % (09/23 1356)   General appearance: alert, cooperative and no distress Resp: clear to auscultation bilaterally Cardio: regular rate and rhythm GI: normal findings: bowel sounds normal and soft, non-tender Skin: back wound is clean, drain in place  Lab Results  Recent Labs  06/20/14 1250 06/20/14 1445 06/21/14 2118 06/22/14 0400  WBC 10.4  --   --  14.0*  HGB 6.3*  --   --  8.5*  HCT 20.8*  --   --  28.1*  NA  --  142 138  --   K  --  3.4* 3.6*  --   CL  --  106 103  --   CO2  --  24 21  --   BUN  --  5* 8  --   CREATININE  --   0.73 0.59  --    Liver Panel  Recent Labs  06/21/14 2118  PROT 7.0  ALBUMIN 1.6*  AST 15  ALT 5  ALKPHOS 112  BILITOT 0.4   Sedimentation Rate No results found for this basename: ESRSEDRATE,  in the last 72 hours C-Reactive Protein No results found for this basename: CRP,  in the last 72 hours  Microbiology: Recent Results (from the past 240 hour(s))  CSF CULTURE     Status: None   Collection Time    06/14/14  6:40 PM      Result Value Ref Range Status   Specimen Description CSF   Final   Special Requests SPINAL FLUID IN CUP   Final   Gram Stain     Final   Value: ABUNDANT WBC PRESENT,BOTH PMN AND MONONUCLEAR     NO ORGANISMS SEEN     Performed at [facility]     Performed at [facility]   Culture     Final   Value: FEW METHICILLIN RESISTANT STAPHYLOCOCCUS AUREUS     Note: RIFAMPIN AND GENTAMICIN SHOULD NOT BE USED AS SINGLE DRUGS FOR TREATMENT OF STAPH INFECTIONS. This organism DOES NOT demonstrate inducible Clindamycin resistance in vitro. CRITICAL RESULT CALLED TO, READ BACK BY AND VERIFIED WITH: TARA CROSS      06/17/14 1300 BY SMITHERSJ     Performed at [facility]   Report Status 06/17/2014 FINAL   Final   Organism ID, Bacteria METHICILLIN RESISTANT STAPHYLOCOCCUS AUREUS   Final  GRAM STAIN     Status: None   Collection Time    06/14/14  6:40 PM      Result Value Ref Range Status   Specimen Description CSF   Final   Special Requests SPINAL FLUID IN CUP   Final   Gram Stain     Final   Value: ABUNDANT WBC PRESENT,BOTH PMN AND MONONUCLEAR     NO ORGANISMS SEEN     DIRECT SMEAR   Report Status 06/14/2014 FINAL   Final  WOUND CULTURE     Status: None   Collection Time    06/17/14  4:57 PM      Result Value Ref Range Status   Specimen Description WOUND BACK   Final   Special Requests NONE   Final   Gram Stain  Final   Value: FEW WBC PRESENT,BOTH PMN AND MONONUCLEAR     NO SQUAMOUS EPITHELIAL CELLS SEEN     NO ORGANISMS SEEN      Performed at [facility]   Culture     Final   Value: NO GROWTH 2 DAYS     Performed at [facility]   Report Status 06/20/2014 FINAL   Final  ANAEROBIC CULTURE     Status: None   Collection Time    06/17/14  4:57 PM      Result Value Ref Range Status   Specimen Description WOUND BACK   Final   Special Requests NONE   Final   Gram Stain     Final   Value: FEW WBC PRESENT,BOTH PMN AND MONONUCLEAR     NO SQUAMOUS EPITHELIAL CELLS SEEN     NO ORGANISMS SEEN     Performed at [facility]   Culture     Final   Value: NO ANAEROBES ISOLATED     Performed at [facility]   Report Status 06/22/2014 FINAL   Final    Studies/Results: Ct Lumbar Spine Wo Contrast  06/21/2014   CLINICAL DATA:  Debridement of lumbar spine  EXAM: CT LUMBAR SPINE WITHOUT CONTRAST  TECHNIQUE: Multidetector CT imaging of the lumbar spine was performed without intravenous contrast administration. Multiplanar CT image reconstructions were also generated.  COMPARISON:  None.  FINDINGS: The alignment is anatomic. There is no acute fracture or static listhesis. There is no spondylolysis.  There is posterior spinal fusion from T10 through L5. There are vertical stabilizing rods which are in satisfactory position. There is a chronic burst fracture of the T12 vertebral body. There are laminectomies from L2 through L5. There is interbody disc material from L2 through S1 without osseous incorporation. There has been interval removal of the bilateral sacral screws. There is air present within the surgical bed of the posterior elements from L2 through L5.  There are degenerative changes of bilateral SI joints.  IMPRESSION: 1. Interval removal of bilateral S1 hardware. 2. Extensive posterior spinal fusion from T10 through L5 with postsurgical changes in the posterior lumbar soft tissues.   Electronically Signed   By: Elige Ko   On: 06/21/2014 20:23     Assessment/Plan: Infected Hematoma  MRSA wound  infection Osteomyelitis Perforated Ulcer  Anemia  DM2  Musculoskeletal pain?   Total days of antibiotics:  9-22 rifampin 9-19 vancomycin  9-11 vancomycin 9-13  9-11 zosyn 9-21  9-12 Diflucan 9-18  Would continue vanco/rifampin for 42 days Will have him seen in ID clinic in 3-4 weeks for f/u Available as needed.           Johny Sax Infectious Diseases (pager) [phone] [email or web address] 06/22/2014, 3:18 PM  LOS: 12 days

## 2014-06-22 NOTE — Progress Notes (Signed)
Patient ID: Devin [name], male   DOB: [birth year], 43 y.o.   MRN: [id number] 5 Days Post-Op  Subjective: Pt feels ok today.    Objective: Vital signs in last 24 hours: Temp:  [98.7 F (37.1 C)-98.9 F (37.2 C)] 98.9 F (37.2 C) (09/23 0550) Pulse Rate:  [98-99] 98 (09/23 0550) Resp:  [16-17] 16 (09/23 0550) BP: (135-136)/(79-86) 136/79 mmHg (09/23 0550) SpO2:  [99 %-100 %] 100 % (09/23 0550) Last BM Date: 06/22/14  Intake/Output from previous day: 09/22 0701 - 09/23 0700 In: 360 [P.O.:360] Out: 1005 [Urine:550; Drains:455] Intake/Output this shift: Total I/O In: 240 [P.O.:240] Out: -   PE: Abd: soft, VAC removed.  Wound is 100% clean.    Lab Results:   Recent Labs  06/20/14 1250 06/22/14 0400  WBC 10.4 14.0*  HGB 6.3* 8.5*  HCT 20.8* 28.1*  PLT 722* 837*   BMET  Recent Labs  06/20/14 1445 06/21/14 2118  NA 142 138  K 3.4* 3.6*  CL 106 103  CO2 24 21  GLUCOSE 99 86  BUN 5* 8  CREATININE 0.73 0.59  CALCIUM 7.8* 7.8*   PT/INR No results found for this basename: LABPROT, INR,  in the last 72 hours CMP     Component Value Date/Time   NA 138 06/21/2014 2118   K 3.6* 06/21/2014 2118   CL 103 06/21/2014 2118   CO2 21 06/21/2014 2118   GLUCOSE 86 06/21/2014 2118   BUN 8 06/21/2014 2118   CREATININE 0.59 06/21/2014 2118   CALCIUM 7.8* 06/21/2014 2118   PROT 7.0 06/21/2014 2118   ALBUMIN 1.6* 06/21/2014 2118   AST 15 06/21/2014 2118   ALT 5 06/21/2014 2118   ALKPHOS 112 06/21/2014 2118   BILITOT 0.4 06/21/2014 2118   GFRNONAA >90 06/21/2014 2118   GFRAA >90 06/21/2014 2118   Lipase  No results found for this basename: lipase       Studies/Results: Ct Lumbar Spine Wo Contrast  06/21/2014   CLINICAL DATA:  Debridement of lumbar spine  EXAM: CT LUMBAR SPINE WITHOUT CONTRAST  TECHNIQUE: Multidetector CT imaging of the lumbar spine was performed without intravenous contrast administration. Multiplanar CT image reconstructions were also generated.  COMPARISON:  None.   FINDINGS: The alignment is anatomic. There is no acute fracture or static listhesis. There is no spondylolysis.  There is posterior spinal fusion from T10 through L5. There are vertical stabilizing rods which are in satisfactory position. There is a chronic burst fracture of the T12 vertebral body. There are laminectomies from L2 through L5. There is interbody disc material from L2 through S1 without osseous incorporation. There has been interval removal of the bilateral sacral screws. There is air present within the surgical bed of the posterior elements from L2 through L5.  There are degenerative changes of bilateral SI joints.  IMPRESSION: 1. Interval removal of bilateral S1 hardware. 2. Extensive posterior spinal fusion from T10 through L5 with postsurgical changes in the posterior lumbar soft tissues.   Electronically Signed   By: Elige Ko   On: 06/21/2014 20:23    Anti-infectives: Anti-infectives   Start     Dose/Rate Route Frequency Ordered Stop   06/22/14 0000  vancomycin (VANCOCIN) 1,250 mg in sodium chloride 0.9 % 250 mL IVPB     1,250 mg 166.7 mL/hr over 90 Minutes Intravenous Every 12 hours 06/21/14 1706     06/21/14 1700  rifampin (RIFADIN) capsule 300 mg     300 mg Oral Daily 06/21/14 1557  06/19/14 0300  vancomycin (VANCOCIN) IVPB 1000 mg/200 mL premix  Status:  Discontinued     1,000 mg 200 mL/hr over 60 Minutes Intravenous Every 12 hours 06/19/14 0231 06/21/14 1706   06/19/14 0200  vancomycin (VANCOCIN) 500 mg in sodium chloride 0.9 % 100 mL IVPB  Status:  Discontinued     500 mg 100 mL/hr over 60 Minutes Intravenous Every 12 hours 06/18/14 1335 06/19/14 0231   06/18/14 1330  vancomycin (VANCOCIN) 2,000 mg in sodium chloride 0.9 % 500 mL IVPB     2,000 mg 250 mL/hr over 120 Minutes Intravenous  Once 06/18/14 1319 06/18/14 1602   06/17/14 1608  bacitracin 50,000 Units in sodium chloride irrigation 0.9 % 500 mL irrigation  Status:  Discontinued       As needed 06/17/14  1608 06/17/14 1708   06/11/14 0900  fluconazole (DIFLUCAN) IVPB 200 mg     200 mg 100 mL/hr over 60 Minutes Intravenous Every 24 hours 06/11/14 0832 06/17/14 1100   06/10/14 1900  vancomycin (VANCOCIN) 1,250 mg in sodium chloride 0.9 % 250 mL IVPB  Status:  Discontinued     1,250 mg 166.7 mL/hr over 90 Minutes Intravenous Every 8 hours 06/10/14 0927 06/12/14 0954   06/10/14 1700  piperacillin-tazobactam (ZOSYN) IVPB 3.375 g  Status:  Discontinued     3.375 g 12.5 mL/hr over 240 Minutes Intravenous Every 8 hours 06/10/14 0927 06/20/14 1050   06/10/14 1100  vancomycin (VANCOCIN) 1,750 mg in sodium chloride 0.9 % 500 mL IVPB     1,750 mg 250 mL/hr over 120 Minutes Intravenous  Once 06/10/14 0927 06/10/14 1256   06/10/14 1030  piperacillin-tazobactam (ZOSYN) IVPB 3.375 g     3.375 g 100 mL/hr over 30 Minutes Intravenous  Once 06/10/14 0927 06/10/14 1030   06/10/14 0300  piperacillin-tazobactam (ZOSYN) IVPB 3.375 g     3.375 g 12.5 mL/hr over 240 Minutes Intravenous  Once 06/10/14 0249 06/10/14 0444       Assessment/Plan   POD #12. Laparotomy and repair of perforated pyloric channel ulcer.  Anemia  Plan: 1. DC abdominal wound VAC.  NS WD dressing changes started to midline wound 2. Will see Friday.  LOS: 12 days    Seline Enzor E 06/22/2014, 10:03 AM Pager: [phone]

## 2014-06-22 NOTE — Progress Notes (Addendum)
Patient ID: Devin [name], male   DOB: 1971/02/09, 43 y.o.   MRN: [id number] Vital signs are stable. Hemoglobin is now 8.5, white count is 14,000 up from 10,000 Drain with minimal output CT scan reviewed and demonstrates L5-S1 appears reasonably stable Will have the patient fitted for TLSO to support lower spine better Antibiotics per infectious disease recommendation for MRSA and lumbar wound Will add Valium for patient's comfort secondary to low back pain.

## 2014-06-22 NOTE — Progress Notes (Signed)
Orthopedic Tech Progress Note Patient Details:  Devin [name] [birth year] [id number]  Patient ID: [name], male   DOB: 1970/12/07, 43 y.o.   MRN: [id number] Called [facility] [employer] brace order; spoke with Lenore Manner, Latoy Labriola 06/22/2014, 2:51 PM

## 2014-06-22 NOTE — Progress Notes (Signed)
Devin [name]. Corliss Skains, MD, [facility]  General/ Trauma Surgery  06/22/2014 10:11 AM

## 2014-06-22 NOTE — Progress Notes (Signed)
Physical Therapy Treatment Patient Details Name: Devin [name] MRN: [id number] DOB: 09/14/1971 Today's Date: 06/22/2014    History of Present Illness Pt is a 43 yo M who has been undergoing treatment by the neurosurgery team for back issues. He underwent lumbar fusion earlier in the summer and had several evacuations of hematomas. He went to the hospital in [place] and was seen to have another fluid collection in his surgical site.  Since admission pt has undergone [name], repair of perforated pyloric channel ulcer on 06/10/14.  And most recently s/p Debridement of wound, removal of S1 pedicular screws.      PT Comments    Pt was very emotional and requesting to go outside with therapy; nursing reported pt had orders from MD and could go outside with therapy. Pt able to increase mobility x 2 attempts today and was taken outside to raise morale and spirits. Pt very thankful and appreciative. Will cont to follow per POC.  Follow Up Recommendations  Home health PT;Supervision/Assistance - 24 hour     Equipment Recommendations  None recommended by PT    Recommendations for Other Services       Precautions / Restrictions Precautions Precautions: Back Precaution Comments: pt able to verbally recall precautions  Required Braces or Orthoses: Other Brace/Splint Other Brace/Splint: lumbar corsette fitting better today; does need replacement velcro straps to tighten properly Restrictions Weight Bearing Restrictions: No    Mobility  Bed Mobility               General bed mobility comments: pt in recliner and returned to recliner   Transfers Overall transfer level: Needs assistance Equipment used: Rolling walker (2 wheeled) Transfers: Sit to/from Stand Sit to Stand: Supervision         General transfer comment: cues for hand placement and safety with RW; pt also unaware of safety when using wheelchair and educated on safe transition to wheelchair    Ambulation/Gait Ambulation/Gait assistance: Supervision Ambulation Distance (Feet): 300 Feet (15' x 2) Assistive device: Rolling walker (2 wheeled) Gait Pattern/deviations: Step-through pattern;Decreased stride length;Trunk flexed;Wide base of support Gait velocity: cues for safe gt speed   General Gait Details: multimodal cues for safe gt speed and proper use of RW;pt fatigued easily and demo SOB with incr mobility   Stairs            Wheelchair Mobility    Modified Rankin (Stroke Patients Only)       Balance           Standing balance support: During functional activity;No upper extremity supported Standing balance-Leahy Scale: Fair Standing balance comment: lets go of RW for short amount of time and no LOB noted                    Cognition Arousal/Alertness: Awake/alert Behavior During Therapy: WFL for tasks assessed/performed Overall Cognitive Status: Within Functional Limits for tasks assessed                      Exercises      General Comments General comments (skin integrity, edema, etc.): pt very discouraged regarding financial situation , his lose of job and long hositalization ; pt taken outside with wheelchair for fresh air and to raise morale      Pertinent Vitals/Pain Pain Assessment: 0-10 Pain Score: 7  Pain Location: bil hips Pain Descriptors / Indicators: Aching Pain Intervention(s): Monitored during session;Premedicated before session    Home Living  Prior Function            PT Goals (current goals can now be found in the care plan section) Acute Rehab PT Goals Patient Stated Goal: to get back to working PT Goal Formulation: With patient Time For Goal Achievement: 07/02/14 Potential to Achieve Goals: Good Progress towards PT goals: Progressing toward goals    Frequency  Min 4X/week    PT Plan Current plan remains appropriate    Co-evaluation             End of Session  Equipment Utilized During Treatment: Gait belt;Back brace Activity Tolerance: Patient tolerated treatment well Patient left: in chair;with call bell/phone within reach;with nursing/sitter in room     Time: 1413-1501 PT Time Calculation (min): 48 min  Charges:  Automatic Data Training: 23-37 mins                    G CodesShelva Majestic [name] ,Devin [name]  [phone]  06/22/2014, 5:12 PM

## 2014-06-23 LAB — GLUCOSE, CAPILLARY
GLUCOSE-CAPILLARY: 90 mg/dL (ref 70–99)
GLUCOSE-CAPILLARY: 88 mg/dL (ref 70–99)
Glucose-Capillary: 104 mg/dL — ABNORMAL HIGH (ref 70–99)
Glucose-Capillary: 108 mg/dL — ABNORMAL HIGH (ref 70–99)

## 2014-06-23 NOTE — Discharge Instructions (Signed)
[facility]      [facility] [phone]  OPEN ABDOMINAL SURGERY: POST OP INSTRUCTIONS  Always review your discharge instruction sheet given to you by the facility where your surgery was performed.  IF YOU HAVE DISABILITY OR FAMILY LEAVE FORMS, YOU MUST BRING THEM TO THE OFFICE FOR PROCESSING.  PLEASE DO NOT GIVE THEM TO YOUR DOCTOR.  1. A prescription for pain medication may be given to you upon discharge.  Take your pain medication as prescribed, if needed.  If narcotic pain medicine is not needed, then you may take acetaminophen (Tylenol) or ibuprofen (Advil) as needed. 2. Take your usually prescribed medications unless otherwise directed. 3. If you need a refill on your pain medication, please contact your pharmacy. They will contact our office to request authorization.  Prescriptions will not be filled after 5pm or on week-ends. 4. You should follow a light diet the first few days after arrival home, such as soup and crackers, pudding, etc.unless your doctor has advised otherwise. A high-fiber, low fat diet can be resumed as tolerated.   Be sure to include lots of fluids daily. Most patients will experience some swelling and bruising on the chest and neck area.  Ice packs will help.  Swelling and bruising can take several days to resolve 5. Most patients will experience some swelling and bruising in the area of the incision. Ice pack will help. Swelling and bruising can take several days to resolve..  6. It is common to experience some constipation if taking pain medication after surgery.  Increasing fluid intake and taking a stool softener will usually help or prevent this problem from occurring.  A mild laxative (Milk of Magnesia or Miralax) should be taken according to package directions if there are no bowel movements after 48 hours. 7.  You may have steri-strips (small skin tapes) in place directly over the incision.  These strips should be left on the skin for 7-10 days.  If your  surgeon used skin glue on the incision, you may shower in 24 hours.  The glue will flake off over the next 2-3 weeks.  Any sutures or staples will be removed at the office during your follow-up visit. You may find that a light gauze bandage over your incision may keep your staples from being rubbed or pulled. You may shower and replace the bandage daily. 8. ACTIVITIES:  You may resume regular (light) daily activities beginning the next day--such as daily self-care, walking, climbing stairs--gradually increasing activities as tolerated.  You may have sexual intercourse when it is comfortable.  Refrain from any heavy lifting or straining until approved by your doctor. a. You may drive when you no longer are taking prescription pain medication, you can comfortably wear a seatbelt, and you can safely maneuver your car and apply brakes b. Return to Work: ___________________________________ 9. You should see your doctor in the office for a follow-up appointment approximately two weeks after your surgery.  Make sure that you call for this appointment within a day or two after you arrive home to insure a convenient appointment time. OTHER INSTRUCTIONS:  _____________________________________________________________ _____________________________________________________________  WHEN TO CALL YOUR DOCTOR: 1. Fever over 101.0 2. Inability to urinate 3. Nausea and/or vomiting 4. Extreme swelling or bruising 5. Continued bleeding from incision. 6. Increased pain, redness, or drainage from the incision. 7. Difficulty swallowing or breathing 8. Muscle cramping or spasms. 9. Numbness or tingling in hands or feet or around lips.  The clinic staff is available to   answer your questions during regular business hours.  Please don't hesitate to call and ask to speak to one of the nurses if you have concerns.  For further questions, please visit [email or web address]   

## 2014-06-24 DIAGNOSIS — E8779 Other fluid overload: Secondary | ICD-10-CM

## 2014-06-24 DIAGNOSIS — E8809 Other disorders of plasma-protein metabolism, not elsewhere classified: Secondary | ICD-10-CM

## 2014-06-24 DIAGNOSIS — T8140XA Infection following a procedure, unspecified, initial encounter: Secondary | ICD-10-CM

## 2014-06-24 LAB — BASIC METABOLIC PANEL
ANION GAP: 12 (ref 5–15)
BUN: 9 mg/dL (ref 6–23)
CHLORIDE: 100 meq/L (ref 96–112)
CO2: 24 meq/L (ref 19–32)
CREATININE: 0.82 mg/dL (ref 0.50–1.35)
Calcium: 9 mg/dL (ref 8.4–10.5)
GFR calc Af Amer: >90 mL/min (ref >90)
GFR calc non Af Amer: >90 mL/min (ref >90)
GLUCOSE: 94 mg/dL (ref 70–99)
Potassium: 3.8 mEq/L (ref 3.7–5.3)
Sodium: 136 mEq/L — ABNORMAL LOW (ref 137–147)

## 2014-06-24 LAB — MAGNESIUM: Magnesium: 2 mg/dL (ref 1.5–2.5)

## 2014-06-24 MED ORDER — POTASSIUM CHLORIDE CRYS ER 20 MEQ PO TBCR
40.0000 meq | EXTENDED_RELEASE_TABLET | Freq: Every day | ORAL | Status: DC
  Administered 2014-06-24 – 2014-06-29 (×6): 40 meq via ORAL
  Filled 2014-06-24 (×7): qty 2

## 2014-06-24 MED ORDER — ENSURE COMPLETE PO LIQD
237.0000 mL | Freq: Three times a day (TID) | ORAL | Status: DC
  Administered 2014-06-24: 237 mL via ORAL

## 2014-06-24 MED ORDER — FUROSEMIDE 10 MG/ML IJ SOLN
40.0000 mg | Freq: Two times a day (BID) | INTRAMUSCULAR | Status: DC
  Administered 2014-06-24 – 2014-06-26 (×4): 40 mg via INTRAVENOUS
  Filled 2014-06-24 (×8): qty 4

## 2014-06-24 MED ORDER — HYDROMORPHONE HCL 2 MG PO TABS
2.0000 mg | ORAL_TABLET | ORAL | Status: DC | PRN
  Administered 2014-06-24 – 2014-07-02 (×33): 2 mg via ORAL
  Filled 2014-06-24 (×33): qty 1

## 2014-06-24 NOTE — Progress Notes (Signed)
ANTIBIOTIC CONSULT NOTE - FOLLOW UP  Pharmacy Consult:  Vancomycin Indication:  CSF culture positive for MRSA  Allergies  Allergen Reactions  . Codeine Other (See Comments)    Skin "crawl"  . Hydrocodone Other (See Comments)    Skin "crawl"  . Other Nausea Only and Other (See Comments)    Darvocet    Patient Measurements: Height:  (177.8 cm) Weight: 229 lb 0.9 oz (103.9 kg) IBW/kg (Calculated) : 73  Vital Signs: Temp: 99 F (37.2 C) (09/25 0500) Temp src: Oral (09/25 0500) BP: 135/65 mmHg (09/25 0500) Pulse Rate: 90 (09/25 0500) Intake/Output from previous day: 09/24 0701 - 09/25 0700 In: 118 [P.O.:118] Out: 710 [Urine:700; Drains:10] Intake/Output from this shift: Total I/O In: 120 [P.O.:120] Out: -   Labs:  Recent Labs  06/21/14 2118 06/22/14 0400  WBC  --  14.0*  HGB  --  8.5*  PLT  --  837*  CREATININE 0.59  --    Estimated Creatinine Clearance: 140.9 ml/min (by C-G formula based on Cr of 0.59).  Recent Labs  06/21/14 1430  VANCOTROUGH 13.6     Microbiology: Recent Results (from the past 720 hour(s))  MRSA PCR SCREENING     Status: Abnormal   Collection Time    06/10/14  9:14 AM      Result Value Ref Range Status   MRSA by PCR POSITIVE (*) NEGATIVE Final   Comment:            The GeneXpert MRSA Assay (FDA     approved for NASAL specimens     only), is one component of a     comprehensive MRSA colonization     surveillance program. It is not     intended to diagnose MRSA     infection nor to guide or     monitor treatment for     MRSA infections.     RESULT CALLED TO, READ BACK BY AND VERIFIED WITH:     Ricci Barker RN 11:30 06/10/14 ([email or web address])  URINE CULTURE     Status: None   Collection Time    06/10/14  9:14 AM      Result Value Ref Range Status   Specimen Description URINE, CATHETERIZED   Final   Special Requests Normal   Final   Culture  Setup Time     Final   Value: 06/10/2014 17:14     Performed at [facility] Count     Final   Value: NO GROWTH     Performed at [facility]   Culture     Final   Value: NO GROWTH     Performed at [facility]   Report Status 06/11/2014 FINAL   Final  WOUND CULTURE     Status: None   Collection Time    06/14/14  6:40 PM      Result Value Ref Range Status   Specimen Description WOUND   Final   Special Requests PUS FROM BACK WOUND   Final   Gram Stain     Final   Value: ABUNDANT WBC PRESENT,BOTH PMN AND MONONUCLEAR     NO ORGANISMS SEEN     Performed at [facility]     Performed at [facility]   Culture     Final   Value: FEW METHICILLIN RESISTANT STAPHYLOCOCCUS AUREUS     Note: RIFAMPIN AND GENTAMICIN SHOULD NOT BE USED AS SINGLE DRUGS FOR TREATMENT OF  STAPH INFECTIONS. This organism DOES NOT demonstrate inducible Clindamycin resistance in vitro. CRITICAL RESULT CALLED TO, READ BACK BY AND VERIFIED WITH: TARA CROSS      06/17/14 1300 BY SMITH     Performed at [facility]   Report Status 06/22/2014 FINAL   Final   Organism ID, Bacteria METHICILLIN RESISTANT STAPHYLOCOCCUS AUREUS   Final  GRAM STAIN     Status: None   Collection Time    06/14/14  6:40 PM      Result Value Ref Range Status   Specimen Description WOUND   Final   Special Requests PUS FROM BACK WOUND   Final   Gram Stain     Final   Value: ABUNDANT WBC PRESENT,BOTH PMN AND MONONUCLEAR     NO ORGANISMS SEEN   Report Status 06/22/2014 FINAL   Final  WOUND CULTURE     Status: None   Collection Time    06/17/14  4:57 PM      Result Value Ref Range Status   Specimen Description WOUND BACK   Final   Special Requests NONE   Final   Gram Stain     Final   Value: FEW WBC PRESENT,BOTH PMN AND MONONUCLEAR     NO SQUAMOUS EPITHELIAL CELLS SEEN     NO ORGANISMS SEEN     Performed at [facility]   Culture     Final   Value: NO GROWTH 2 DAYS     Performed at [facility]   Report Status 06/20/2014 FINAL   Final  ANAEROBIC CULTURE      Status: None   Collection Time    06/17/14  4:57 PM      Result Value Ref Range Status   Specimen Description WOUND BACK   Final   Special Requests NONE   Final   Gram Stain     Final   Value: FEW WBC PRESENT,BOTH PMN AND MONONUCLEAR     NO SQUAMOUS EPITHELIAL CELLS SEEN     NO ORGANISMS SEEN     Performed at [facility]   Culture     Final   Value: NO ANAEROBES ISOLATED     Performed at [facility]   Report Status 06/22/2014 FINAL   Final      Assessment: 43 YOM continues on vancomycin for positive CSF culture for MRSA and peritonitis.  He is s/p OR for debridment of lumbar wound.  Last vancomycin trough was sub-therapeutic and dose was adjusted.  Patient's renal function has been stable (last BMET 06/21/14).  I/O's inaccurate.  Vanc 9/11 >> 9/13, resumed 9/19 >> Rifampin 9/22 >>  Zosyn 9/11 >> 9/21 Fluc 9/12 >> 9/18  9/22 VT = 13.6 mcg/mL on 1g q12 >> incr  q12  9/11 urine - negative MRSA PCR - positive 9/15 CSF - few MRSA 9/18 wound culture - NGTD   Goal of Therapy:  Vancomycin trough level 15-20 mcg/ml   Plan:  - Continue vanc  IV Q12H - Continue rifampin  PO daily per MD - Monitor renal fxn, clinical course, weekly VT to r/o accumulation - BMET tomorrow, VT early next week   Markees Carns D. Laney Potash, PharmD, BCPS Pager:  [phone] 06/24/2014, 10:26 AM

## 2014-06-24 NOTE — Progress Notes (Signed)
Occupational Therapy Treatment Patient Details Name: Devin [name] MRN: [id number] DOB: 08-Mar-1971 Today's Date: 06/24/2014    History of present illness Pt is a 43 yo M who has been undergoing treatment by the neurosurgery team for back issues. He underwent lumbar fusion earlier in the summer and had several evacuations of hematomas. He went to the hospital in [place] and was seen to have another fluid collection in his surgical site.  Since admission pt has undergone [name], repair of perforated pyloric channel ulcer on 06/10/14.  And most recently s/p Debridement of wound, removal of S1 pedicular screws.     OT comments  This 43 yo is progressing, he is not sure that he has any further concerns with BADLs at home other than he is fearful of getting in and out of the tub by himself--we can address this with him next session   Follow Up Recommendations  Home health OT;Supervision/Assistance - 24 hour          Precautions / Restrictions Precautions Precautions: Back Required Braces or Orthoses: Other Brace/Splint Other Brace/Splint: TLSO delivered; however it is not fitting him properly in sitting  (cutting into thighs and at neck--[employer] called). He was seated in recliner upon arrival without brace on (said he wore if for 1-2 hours earlier but  really uncomfortable in sitting). He has been getting up to bathroom without brace on (I have asked RN to cal [name] office to ask if this is OK?) Restrictions Weight Bearing Restrictions: No              ADL                       Lower Body Dressing: Supervision/safety;Set up (Pt can cross his legs to get to his feet even though currently they are very edematous)                 General ADL Comments: Talked with pt about when he does go home how he will need to have someone help him set up the items in his kitchen that he needs the most so that they are easily accessible to be in line with hm  following his back precautions. We also talked about laundry and cleaning his floors (he says that  he can have someone come in to help with cleaning floors and maybe they could help with his laundry too). He has a Sports administrator at home so I explained to him that he could use this to get items out of washer and dryer and he could lay laundry over the front of his walker to  move if from point A to B.                Cognition   Behavior During Therapy: WFL for tasks assessed/performed Overall Cognitive Status: Within Functional Limits for tasks assessed                                    Pertinent Vitals/ Pain       Pain Assessment: No/denies pain         Frequency Min 2X/week     Progress Toward Goals  OT Goals(current goals can now be found in the care plan section)  Progress towards OT goals: Progressing toward goals     Plan Discharge plan remains appropriate          Activity  Tolerance Patient tolerated treatment well   Patient Left in chair   Nurse Communication  (Need for brace clarification when up to bathroom, and I had called [employer] about fitting of brace in sitting)        Time: 1610-9604 OT Time Calculation (min): 20 min  Charges: OT General Charges $OT Visit: 1 Procedure OT Treatments $Self Care/Home Management : 8-22 mins  Evette Georges [phone] 06/24/2014, 12:18 PM

## 2014-06-24 NOTE — Clinical Social Work Placement (Signed)
Clinical Social Work Department CLINICAL SOCIAL WORK PLACEMENT NOTE 06/24/2014  Patient:  Devin [name], Devin [name]  Account Number:  [id number] Admit date:  06/10/2014  Clinical Social Worker:  Merlyn Lot, CLINICAL SOCIAL WORKER  Date/time:  06/24/2014 02:53 PM  Clinical Social Work is seeking post-discharge placement for this patient at the following level of care:   SKILLED NURSING   (*CSW will update this form in Epic as items are completed)   06/24/2014  Patient/family provided with [facility] Department of Clinical Social Work's list of facilities offering this level of care within the geographic area requested by the patient (or if unable, by the patient's family).  06/24/2014  Patient/family informed of their freedom to choose among providers that offer the needed level of care, that participate in Medicare, Medicaid or managed care program needed by the patient, have an available bed and are willing to accept the patient.  06/24/2014  Patient/family informed of [facility]' ownership interest in [facility], as well as of the fact that they are under no obligation to receive care at this facility.  PASARR submitted to EDS on 06/24/2014 PASARR number received on 06/24/2014  FL2 transmitted to all facilities in geographic area requested by pt/family on  06/24/2014 FL2 transmitted to all facilities within larger geographic area on   Patient informed that his/her managed care company has contracts with or will negotiate with  certain facilities, including the following:     Patient/family informed of bed offers received:   Patient chooses bed at  Physician recommends and patient chooses bed at    Patient to be transferred to  on   Patient to be transferred to facility by  Patient and family notified of transfer on  Name of family member notified:    The following physician request were entered in Epic:   Additional Comments:   Merlyn Lot, [facility] Clinical Social  Worker [phone]

## 2014-06-24 NOTE — Consult Note (Addendum)
[facility] Medical Consultation  Jamonte Curfman AVW:[id number] DOB: 02/25/1971 DOA: 06/10/2014 PCP: No PCP Per Patient   Requesting physician: [name] Date of consultation: 06/24/14 Reason for consultation: fluid overload  Impression/Recommendations 1-Perforated gastric ulcer: surgically repair. Further rec' per general surgery team Patient is eating again and tolerating diet -continue PPI  2-DM type 2 (diabetes mellitus, type 2): A1C 5.1; continue diet controlled  3-infected hematoma: on vanc and rifampin; further treatment and decision as per primary service  4-HTN: stable and well controlled. Continue current regimen  5-fluid overload: secondary to hypoalbuminemia, 3rd shifting and fluid resuscitation during multiple surgeries  -will start lasix IV while inpatient (  BID) -follow renal function closely -replete electrolytes as needed -will increase ensure to TID and continue prostat -diet change to low sodium diet. -will follow response and adjust diuresis accordingly  -apply TED hoses   I will followup again tomorrow. Please contact me if I can be of assistance in the meanwhile. Thank you for this consultation.  Chief Complaint: fluid overload.  HPI:  43 yo M who has been undergoing treatment by the neurosurgery team for back issues. He underwent lumbar fusion earlier in the summer and had several evacuations of hematomas. He went to the hospital in [place] and was seen to have another fluid collection in his surgical site. He was transferred here to be admitted by them for treatment.  However, when the EDP evaluated him, he was found to be tachycardic and tender on his abdominal exam. CT was performed, and this demonstrated free air and finding consistent with a perforated ulcer. This goes along with his history as he has been taking narcotics, and many NSAIDs. Patient has had surgical repair of his peptic ulcer and also another evacuation of infected hematoma by  neurosurgery. For the last 2 weeks patient nutrition has been poor and albumin is 1.6. Patient is fluid overload and neurosurgery has consulted IM for assistance managing his fluid overload.   Review of Systems:  Negative except as mentioned on HPI  Past Medical History  Diagnosis Date  . Diabetes mellitus without complication     diet contolled  only  . Hypertension   . Anxiety    Past Surgical History  Procedure Laterality Date  . Back surgery      x2 surgeries  . Nasal dilation    . Rotator cuff repair    . Posterior lumbar fusion 4 level N/A 04/19/2014    Procedure: LUMBAR TWO-THREE,LUMBAR THREE-FOUR,LUMBAR FOUR-FIVE,LUMBAR FIVE-SACRAL-ONE POSTERIOR LUMBAR INTERBODY FUSION/ADD ON TO THORACIC TEN-LUMBAT TWO FUSION;  Surgeon: Barnett Abu, MD;  Location: [facility] NEURO ORS;  Service: Neurosurgery;  Laterality: N/A;  . Wound exploration N/A 05/11/2014    Procedure: Lumbar WOUND EXPLORATION, evcuation of hematoma;  Surgeon: Temple Pacini, MD;  Location: [facility] NEURO ORS;  Service: Neurosurgery;  Laterality: N/A;  . Lumbar laminectomy/decompression microdiscectomy N/A 05/16/2014    Procedure: Repeat evacuation of lumbar hematoma;  Surgeon: Barnett Abu, MD;  Location: [facility] NEURO ORS;  Service: Neurosurgery;  Laterality: N/A;  Repeat evacuation of lumbar hematoma  . Repair of perforated ulcer  06/10/2014  . Laparotomy N/A 06/10/2014    Procedure: EXPLORATORY LAPAROTOMY;  Surgeon: Almond Lint, MD;  Location: [facility] OR;  Service: General;  Laterality: N/A;  patch repair of perforated pyloric ulcer  . Lumbar wound debridement N/A 06/17/2014    Procedure: Lumbar Wound Debridement with removal of hardware.;  Surgeon: Barnett Abu, MD;  Location: [facility] NEURO ORS;  Service: Neurosurgery;  Laterality: N/A;  Social History:  reports that he has been smoking Cigarettes.  He has a 20 pack-year smoking history. He has quit using smokeless tobacco. He reports that he does not drink alcohol or use illicit  drugs.  Allergies  Allergen Reactions  . Codeine Other (See Comments)    Skin "crawl"  . Hydrocodone Other (See Comments)    Skin "crawl"  . Other Nausea Only and Other (See Comments)    Darvocet   Family History  Problem Relation Age of Onset  . Diabetes Mother   . Cancer Father     lung    Prior to Admission medications   Medication Sig Start Date End Date Taking? Authorizing Provider  acetaminophen (TYLENOL) 500 MG tablet Take 2,000 mg by mouth every 6 (six) hours as needed for moderate pain or headache.    Yes Historical Provider, MD  lisinopril (PRINIVIL,ZESTRIL) 20 MG tablet Take 1 tablet (20 mg total) by mouth daily. 04/23/14  Yes Lisbeth Renshaw, MD  naproxen (NAPROSYN) 500 MG tablet Take 500 mg by mouth 2 (two) times daily as needed for moderate pain.    Yes Historical Provider, MD  oxyCODONE (OXY IR/ROXICODONE) 5 MG immediate release tablet Take 1 tablet (5 mg total) by mouth every 4 (four) hours as needed for moderate pain. 05/30/14  Yes Barnett Abu, MD  OxyCODONE (OXYCONTIN) 15 mg T12A 12 hr tablet Take 1 tablet (15 mg total) by mouth every 12 (twelve) hours. 05/30/14  Yes Barnett Abu, MD  PARoxetine (PAXIL) 40 MG tablet Take 1 tablet (40 mg total) by mouth every morning. 04/23/14  Yes Lisbeth Renshaw, MD   Physical Exam: Blood pressure 134/91, pulse 102, temperature 98 F (36.7 C), temperature source Oral, resp. rate 16, height  (1.778 m), weight 103.9 kg (229 lb 0.9 oz), SpO2 100.00%. Filed Vitals:   06/24/14 1300  BP: 134/91  Pulse: 102  Temp: 98 F (36.7 C)  Resp: 16     General:  Feeling better, no fever; reports pain is well controlled. Complaining of LE swelling and pain due to swelling  Eyes: PERRL, no nystagmus  ENT: no erythema or exudates inside his mouth  Neck: no JVD  Cardiovascular: RRR, no rubs or gallops  Respiratory: no wheezing, no rales or crackles  Abdomen: soft, NT, ND, positive BS  Musculoskeletal: LE edema 2-3++  bilaterally from feet to knees; no cyanosis  Psychiatric: good eye contact, normal affect  Neurologic: non focal neurologic or motor deficit on exam.  Labs on Admission:  Basic Metabolic Panel:  Recent Labs Lab 06/18/14 0054 06/20/14 1445 06/21/14 2118 06/24/14 1500  NA 141 142 138 136*  K 4.6 3.4* 3.6* 3.8  CL 107 106 103 100  CO2 GLUCOSE 122* 99 86 94  BUN 5* 5* 8 9  CREATININE 0.59 0.73 0.59 0.82  CALCIUM 7.7* 7.8* 7.8* 9.0  MG  --   --   --  2.0   Liver Function Tests:  Recent Labs Lab 06/21/14 2118  AST 15  ALT 5  ALKPHOS 112  BILITOT 0.4  PROT 7.0  ALBUMIN 1.6*   CBC:  Recent Labs Lab 06/18/14 0054 06/20/14 1250 06/22/14 0400  WBC 9.9 10.4 14.0*  HGB 7.0* 6.3* 8.5*  HCT 23.6* 20.8* 28.1*  MCV 98.3 98.6 90.9  PLT 597* 722* 837*   Cardiac Enzymes:  Recent Labs Lab 06/21/14 2118  TROPONINI <0.30   CBG:  Recent Labs Lab 06/22/14 1948 06/23/14 0011 06/23/14 0405 06/23/14  0743 06/23/14 1152  GLUCAP 109* 104* 90 88 108*    Time spent: 45 minutes  Vassie Loll [facility] Pager [phone]  If 7PM-7AM, please contact night-coverage [email or web address] Password [facility] 06/24/2014, 5:38 PM

## 2014-06-24 NOTE — Clinical Social Work Psychosocial (Signed)
Clinical Social Work Department BRIEF PSYCHOSOCIAL ASSESSMENT 06/24/2014  Patient:  Devin [name], Devin [name]     Account Number:  [id number]     Admit date:  06/10/2014  Clinical Social Worker:  Merlyn Lot, CLINICAL SOCIAL WORKER  Date/Time:  06/24/2014 02:49 PM  Referred by:  Physician  Date Referred:  06/24/2014 Referred for  SNF Placement   Other Referral:   Interview type:  Patient Other interview type:    PSYCHOSOCIAL DATA Living Status:  ALONE Admitted from facility:   Level of care:   Primary support name:  Tammy Primary support relationship to patient:  SIBLING Degree of support available:   Emotional support available but no physicial support    CURRENT CONCERNS Current Concerns  Post-Acute Placement   Other Concerns:    SOCIAL WORK ASSESSMENT / PLAN Patient has been anticipating a DC home with home health but patient has now begun IV antibiotics and no longer feels as if home is a good choice due to lack of supervision.  CSW was informed that SNF is more appropriate for patients current level of needs.  Patient is agreeable to SNF.   Assessment/plan status:  Psychosocial Support/Ongoing Assessment of Needs Other assessment/ plan:   FL2  PASAR   Information/referral to community resources:   [facility], [facility] SNF    PATIENT'S/FAMILY'S RESPONSE TO PLAN OF CARE: Patient is agreeable to SNF and had requested SNF during this hospitilzation.       Merlyn Lot, LCSWA Clinical Social Worker [phone]

## 2014-06-24 NOTE — Progress Notes (Signed)
7 Days Post-Op  Subjective: Says he doesn't feel good.  Some of it has to do with pain medicine.  Some of it has to do with insecurity about discharge, he has no one to help at home.  He is fluid overloaded with allot of lower leg edema, and he thinks he is voiding less than before. He is 5.6 liter + if you believe I/O.  Objective: Vital signs in last 24 hours: Temp:  [98.4 F (36.9 C)-99 F (37.2 C)] 99 F (37.2 C) (09/25 0500) Pulse Rate:  [57-101] 90 (09/25 0500) Resp:  [16-20] 20 (09/25 0500) BP: (135-143)/(56-86) 135/65 mmHg (09/25 0500) SpO2:  [95 %-98 %] 98 % (09/25 0500) Last BM Date: 06/22/14 Carb modified diet + BM Afebrile, VSS No labs since  Intake/Output from previous day: 09/24 0701 - 09/25 0700 In: 118 [P.O.:118] Out: 710 [Urine:700; Drains:10] Intake/Output this shift: Total I/O In: 120 [P.O.:120] Out: -   General appearance: alert, cooperative and does not feel good. Resp: clear to auscultation bilaterally GI: soft sore, open wound looks good.  + BS and BM, taking a diet. Extremities: edema both lower legs +2  Lab Results:   Recent Labs  06/22/14 0400  WBC 14.0*  HGB 8.5*  HCT 28.1*  PLT 837*    BMET  Recent Labs  06/21/14 2118  NA 138  K 3.6*  CL 103  CO2 21  GLUCOSE 86  BUN 8  CREATININE 0.59  CALCIUM 7.8*   PT/INR No results found for this basename: LABPROT, INR,  in the last 72 hours   Recent Labs Lab 06/21/14 2118  AST 15  ALT 5  ALKPHOS 112  BILITOT 0.4  PROT 7.0  ALBUMIN 1.6*     Lipase  No results found for this basename: lipase     Studies/Results: No results found.  Medications: . sodium chloride   Intravenous Once  . sodium chloride   Intravenous Once  . antiseptic oral rinse  7 mL Mouth Rinse QID  . dextrose  50 mL Intravenous Once  . feeding supplement (ENSURE COMPLETE)  237 mL Oral BID BM  . feeding supplement (PRO-STAT SUGAR FREE 64)  30 mL Oral BID WC  . glucagon (human recombinant)  1 mg  Intravenous Once  . pantoprazole  40 mg Oral BID  . rifampin  300 mg Oral Daily  . sodium chloride  3 mL Intravenous Q12H  . vancomycin  1,250 mg Intravenous Q12H    Assessment/Plan POD #12. Laparotomy and repair of perforated pyloric channel ulcer.  Anemia MRSA wound infection  Osteomyelitis S/p Posterior lumbar fusion 4 level, 04/19/2014, [name] S/p Wound exploration 05/11/2014, [name]  Chronic back pain and medication use. Acute respiratory failure, unspecified whether with hypoxia or hypercapnia  Peritonitis (acute) generalized  Pain  AKI (acute kidney injury)  DM type 2 (diabetes mellitus, type 2   Plan:  From our standpoint he is good.  I will check some labs and make sure nothing has changed and if OK give him some lasix.  Defer to [name], i don't see any cardiac history.  LOS: 14 days    Linette Gunderson 06/24/2014

## 2014-06-24 NOTE — Progress Notes (Signed)
Patient ID: Devin [name], male   DOB: 1971/05/19, 43 y.o.   MRN: [id number] Vital signs are stable. Motor function appears intact in lower extremities Drain removed from back without difficulties Surgical staples removed from back without difficulties Incision was clean and dry Will need prolonged IV antibiotics for at lease 42 days Notes that pain control is poor without a lot of IV Edema noted in the ankles bilaterally with positive fluid balance over the last week. Will ask hospitalist to see patient regarding fluid management issues Will likely need skilled nursing facility for a period of time as he does live alone.

## 2014-06-24 NOTE — Progress Notes (Signed)
Wound doing well Tolerating PO's OK for discharge from General Surgery standpoint.  Wilmon Arms. Corliss Skains, MD, [facility] Surgery  General/ Trauma Surgery  06/24/2014 4:26 PM

## 2014-06-24 NOTE — Progress Notes (Signed)
Physical Therapy Treatment Patient Details Name: Devin [name] MRN: [id number] DOB: 09/05/1971 Today's Date: 06/24/2014    History of Present Illness Pt is a 43 yo M who has been undergoing treatment by the neurosurgery team for back issues. He underwent lumbar fusion earlier in the summer and had several evacuations of hematomas. He went to the hospital in [place] and was seen to have another fluid collection in his surgical site.  Since admission pt has undergone [name], repair of perforated pyloric channel ulcer on 06/10/14.  And most recently s/p Debridement of wound, removal of S1 pedicular screws.      PT Comments    Pt progressing well with mobility. Pt tolerating TLSO brace with mobility. Pt at supervision level for ambulation. Encouraged pt to continue OOB activities with nursing to improve activity tolerance. BP after ambulation 167/72 and O2 at 99%. Will cont to follow per POC.   Follow Up Recommendations  Home health PT;Supervision/Assistance - 24 hour     Equipment Recommendations  None recommended by PT    Recommendations for Other Services       Precautions / Restrictions Precautions Precautions: Back Precaution Comments: pt able to independently recall all precautions with "BAT"; did require min cues during session to apply precautions Required Braces or Orthoses: Other Brace/Splint Other Brace/Splint: new TLSO delievered and fitted by [employer]; pt tolerating well  Restrictions Weight Bearing Restrictions: No    Mobility  Bed Mobility               General bed mobility comments: not addressed; in chair and returned to chair   Transfers Overall transfer level: Needs assistance Equipment used: Rolling walker (2 wheeled) Transfers: Sit to/from Stand Sit to Stand: Supervision         General transfer comment: min cues for back precautions and hand placement  Ambulation/Gait Ambulation/Gait assistance: Supervision Ambulation  Distance (Feet): 400 Feet Assistive device: Rolling walker (2 wheeled) Gait Pattern/deviations: Step-through pattern;Wide base of support;Trunk flexed Gait velocity: cues for safe gt speed   General Gait Details: no LOB noted with mobility; min cues for safety and sequencing; pt with SOB at end of session VSS    Stairs            Wheelchair Mobility    Modified Rankin (Stroke Patients Only)       Balance Overall balance assessment: Needs assistance Sitting-balance support: No upper extremity supported;Feet supported Sitting balance-Leahy Scale: Good     Standing balance support: During functional activity;No upper extremity supported Standing balance-Leahy Scale: Fair Standing balance comment: able to stand minimal amount of time without UE support                    Cognition Arousal/Alertness: Awake/alert Behavior During Therapy: WFL for tasks assessed/performed Overall Cognitive Status: Within Functional Limits for tasks assessed                      Exercises      General Comments General comments (skin integrity, edema, etc.): pt tolerating new TLSO       Pertinent Vitals/Pain Pain Assessment: 0-10 Pain Score: 8  Pain Location: back Pain Descriptors / Indicators: Discomfort Pain Intervention(s): Monitored during session;Premedicated before session;Repositioned    Home Living                      Prior Function            PT Goals (current goals  can now be found in the care plan section) Acute Rehab PT Goals Patient Stated Goal: i just want to do some rehab and get better PT Goal Formulation: With patient Time For Goal Achievement: 07/02/14 Potential to Achieve Goals: Good Progress towards PT goals: Progressing toward goals    Frequency  Min 4X/week    PT Plan Current plan remains appropriate    Co-evaluation             End of Session Equipment Utilized During Treatment: Back brace Activity Tolerance:  Patient tolerated treatment well Patient left: in chair;with call bell/phone within reach;with nursing/sitter in room     Time: 1449-1501 PT Time Calculation (min): 12 min  Charges:  $Gait Training: 8-22 mins                    G CodesDonell Sievert, [place]  [phone] 06/24/2014, 4:47 PM

## 2014-06-25 LAB — BASIC METABOLIC PANEL
ANION GAP: 16 — AB (ref 5–15)
BUN: 9 mg/dL (ref 6–23)
CHLORIDE: 101 meq/L (ref 96–112)
CO2: 22 meq/L (ref 19–32)
Calcium: 8 mg/dL — ABNORMAL LOW (ref 8.4–10.5)
Creatinine, Ser: 0.66 mg/dL (ref 0.50–1.35)
GFR calc non Af Amer: >90 mL/min (ref >90)
Glucose, Bld: 120 mg/dL — ABNORMAL HIGH (ref 70–99)
POTASSIUM: 3.7 meq/L (ref 3.7–5.3)
Sodium: 139 mEq/L (ref 137–147)

## 2014-06-25 LAB — CBC
HEMATOCRIT: 25.2 % — AB (ref 39.0–52.0)
Hemoglobin: 7.7 g/dL — ABNORMAL LOW (ref 13.0–17.0)
MCH: 27.8 pg (ref 26.0–34.0)
MCHC: 30.6 g/dL (ref 30.0–36.0)
MCV: 91 fL (ref 78.0–100.0)
Platelets: 613 10*3/uL — ABNORMAL HIGH (ref 150–400)
RBC: 2.77 MIL/uL — AB (ref 4.22–5.81)
RDW: 19.8 % — ABNORMAL HIGH (ref 11.5–15.5)
WBC: 13.4 10*3/uL — ABNORMAL HIGH (ref 4.0–10.5)

## 2014-06-25 LAB — SEDIMENTATION RATE: SED RATE: 119 mm/h — AB (ref 0–16)

## 2014-06-25 LAB — C-REACTIVE PROTEIN: CRP: 19.6 mg/dL — AB (ref <0.60)

## 2014-06-25 MED ORDER — ADULT MULTIVITAMIN W/MINERALS CH
1.0000 | ORAL_TABLET | Freq: Every day | ORAL | Status: DC
  Administered 2014-06-25 – 2014-07-02 (×8): 1 via ORAL
  Filled 2014-06-25 (×8): qty 1

## 2014-06-25 NOTE — Progress Notes (Signed)
NUTRITION FOLLOW UP  INTERVENTION: Discontinue Ensure Complete po BID, each supplement provides 350 kcal and 13 grams of protein Continue Prostat liquid protein po 30 ml BID with meals, each supplement provides 100 kcal, 15 grams protein RD to follow for nutrition care plan  NUTRITION DIAGNOSIS: Inadequate oral intake related to altered GI function as evidenced by PO intake 85-95%, resolved.  Increased nutrient (protein) needs related to wound VAC as evidenced by estimated protein needs.  Goal: Pt to meet >/= 90% of their estimated nutrition needs, meeting  Monitor:  PO & supplemental intake, weight, labs, I/O's  ASSESSMENT: 43 yo M who underwent lumbar fusion earlier in the summer and had several evacuations of hematomas. Went to [facility] on 9/11, then transferred to [facility]. In ED, CT demonstrated free air and a perforated ulcer.  Patient s/p procedures 9/11: EXPLORATORY LAPAROTOMY REPAIR OF PERFORATED PYLORIC CHANNEL ULCER   Patient extubated 9/12.  Transferred to [facility] from [facility] 9/11. Chart reviewed.  Pt has been on and off Clear/Full Liquids since 9/16.  Advanced to solids 9/20.    Pt is fluid overloaded, per MD pt's diet will be changed to low sodium and IV fluids are D/C'd. Pt presents with +1 generalized edema and LUE edema, and +2 RLE and LLE edema. Pt states that it is painful to touch.  PO intake variable at 85-95% per flowsheet records, intake is improved. Pt states that he doesn't like Ensure supplements, will discontinue order. Will continue to provide Prostat TID.  Labs reviewed:  Glucose 120  Height: Ht Readings from Last 1 Encounters:  06/10/14  (1.778 m)    Weight: Wt Readings from Last 1 Encounters:  06/12/14 229 lb 0.9 oz (103.9 kg)    BMI:  Body mass index is 32.87 kg/(m^2).   Re-estimated Nutritional Needs: Kcal: 2100-2300 Protein: 120-130 gm Fluid: 1.5 L/day  Skin: abdominal wound VAC  Diet Order:     Intake/Output  Summary (Last 24 hours) at 06/25/14 1356 Last data filed at 06/25/14 0911  Gross per 24 hour  Intake    240 ml  Output   3425 ml  Net  -3185 ml    Labs:   Recent Labs Lab 06/21/14 2118 06/24/14 1500 06/25/14 0358  NA 138 136* 139  K 3.6* 3.8 3.7  CL 103 100 101  CO2 BUN CREATININE 0.59 0.82 0.66  CALCIUM 7.8* 9.0 8.0*  MG  --  2.0  --   GLUCOSE 86 94 120*    CBG (last 3)   Recent Labs  06/23/14 0405 06/23/14 0743 06/23/14 1152  GLUCAP 90 88 108*    Scheduled Meds: . sodium chloride   Intravenous Once  . sodium chloride   Intravenous Once  . antiseptic oral rinse  7 mL Mouth Rinse QID  . dextrose  50 mL Intravenous Once  . feeding supplement (ENSURE COMPLETE)  237 mL Oral TID BM  . feeding supplement (PRO-STAT SUGAR FREE 64)  30 mL Oral BID WC  . furosemide  40 mg Intravenous Q12H  . glucagon (human recombinant)  1 mg Intravenous Once  . pantoprazole  40 mg Oral BID  . potassium chloride  40 mEq Oral Daily  . rifampin  300 mg Oral Daily  . sodium chloride  3 mL Intravenous Q12H  . vancomycin  1,250 mg Intravenous Q12H    Continuous Infusions: . sodium chloride 250 mL (06/20/14 4098)    Tilda Franco, MS, RD, PLDN  Provisionally Licensed Research scientist (physical sciences): [phone] b

## 2014-06-25 NOTE — Progress Notes (Signed)
Note/chart reviewed. Agree with intervention. Also adding Multivitamin with minerals to help promote wound healing.  Ian Malkin RD, LDN Inpatient Clinical Dietitian Pager: [phone] After Hours Pager: [phone]

## 2014-06-25 NOTE — Progress Notes (Signed)
Patient ID: Devin [name]  male  ZOX:[id number]    DOB: Aug 11, 1971    DOA: 06/10/2014  PCP: No PCP Per Patient                                          CONSULT  PROGRESS NOTE   Brief interim summary 43 yo M who has been undergoing treatment by the neurosurgery team for back issues, underwent lumbar fusion earlier in the summer and had several evacuations of hematomas. He went to the hospital in [place] and was seen to have another fluid collection in his surgical site. He was transferred here to be admitted by them for treatment.  However, when the EDP evaluated him, he was found to be tachycardic and tender on his abdominal exam. CT was performed, and this demonstrated free air and finding consistent with a perforated ulcer. This goes along with his history as he has been taking narcotics, and many NSAIDs. Patient has had surgical repair of his peptic ulcer and also another evacuation of infected hematoma by neurosurgery. For the last 2 weeks patient nutrition has been poor and albumin is 1.6. Patient is fluid overload and neurosurgery has consulted IM for assistance managing his fluid overload.   Assessment/Plan:  Back pain s/p multiple surgeries, wound infection, s/p debridement, infected hematoma  - management per primary team, neurosurgery - Infectious disease following, on vancomycin and rifampin, continue for another 40 days, ID followup in 3-4 weeks post hosp  Perforated gastric ulcer: Status post exploratory laparotomy and repair of perforated pyloric channel ulcer -  Tolerating oral diet, stable from surgical standpoint per Dr Corliss Skains  DM type 2 (diabetes mellitus, type 2): A1C 5.1;  - continue diet controlled   HTN: stable and well controlled.  - Continue current regimen   Fluid overload: secondary to hypoalbuminemia, albumin 1.6, 3rd shifting and fluid resuscitation during multiple surgeries - 2.6 Liters positive balance  - cont lasix IV while inpatient (  BID), check  prealbumin, magnesium, phosphorus, follow electrolytes closely  - Nutrition consult, continue ensure to TID and continue prostat  - diet change to low sodium diet. Discontinue IV fluids. -Continuey TED hoses, patient wants larger size  Anemia:   DVT Prophylaxis:  Code Status:  Family Communication:  Disposition: per Primary team     Subjective: Patient seen and examined, feels better, tolerating oral diet  Objective: Weight change:   Intake/Output Summary (Last 24 hours) at 06/25/14 1035 Last data filed at 06/25/14 0911  Gross per 24 hour  Intake    360 ml  Output   3425 ml  Net  -3065 ml   Blood pressure 115/70, pulse 109, temperature 98.6 F (37 C), temperature source Oral, resp. rate 16, height  (1.778 m), weight 103.9 kg (229 lb 0.9 oz), SpO2 100.00%.  Physical Exam: General: Alert and awake, oriented x3, not in any acute distress. CVS: S1-S2 clear, no murmur rubs or gallops Chest: clear to auscultation bilaterally, no wheezing, rales or rhonchi Abdomen: soft nontender, nondistended, normal bowel sounds  Extremities: no cyanosis, clubbing, 2-3+ edema noted bilaterally Neuro: Cranial nerves II-XII intact, no focal neurological deficits  Lab Results: Basic Metabolic Panel:  Recent Labs Lab 06/24/14 1500 06/25/14 0358  NA 136* 139  K 3.8 3.7  CL 100 101  CO2 24 22  GLUCOSE 94 120*  BUN 9 9  CREATININE 0.82 0.66  CALCIUM  9.0 8.0*  MG 2.0  --    Liver Function Tests:  Recent Labs Lab 06/21/14 2118  AST 15  ALT 5  ALKPHOS 112  BILITOT 0.4  PROT 7.0  ALBUMIN 1.6*   No results found for this basename: LIPASE, AMYLASE,  in the last 168 hours No results found for this basename: AMMONIA,  in the last 168 hours CBC:  Recent Labs Lab 06/22/14 0400 06/25/14 0358  WBC 14.0* 13.4*  HGB 8.5* 7.7*  HCT 28.1* 25.2*  MCV 90.9 91.0  PLT 837* 613*   Cardiac Enzymes:  Recent Labs Lab 06/21/14 2118  TROPONINI <0.30   BNP: No components  found with this basename: POCBNP,  CBG:  Recent Labs Lab 06/22/14 1948 06/23/14 0011 06/23/14 0405 06/23/14 0743 06/23/14 1152  GLUCAP 109* 104* 90 88 108*     Micro Results: Recent Results (from the past 240 hour(s))  WOUND CULTURE     Status: None   Collection Time    06/17/14  4:57 PM      Result Value Ref Range Status   Specimen Description WOUND BACK   Final   Special Requests NONE   Final   Gram Stain     Final   Value: FEW WBC PRESENT,BOTH PMN AND MONONUCLEAR     NO SQUAMOUS EPITHELIAL CELLS SEEN     NO ORGANISMS SEEN     Performed at [facility]   Culture     Final   Value: NO GROWTH 2 DAYS     Performed at [facility]   Report Status 06/20/2014 FINAL   Final  ANAEROBIC CULTURE     Status: None   Collection Time    06/17/14  4:57 PM      Result Value Ref Range Status   Specimen Description WOUND BACK   Final   Special Requests NONE   Final   Gram Stain     Final   Value: FEW WBC PRESENT,BOTH PMN AND MONONUCLEAR     NO SQUAMOUS EPITHELIAL CELLS SEEN     NO ORGANISMS SEEN     Performed at [facility]   Culture     Final   Value: NO ANAEROBES ISOLATED     Performed at [facility]   Report Status 06/22/2014 FINAL   Final    Studies/Results: Ct Lumbar Spine Wo Contrast  06/21/2014   CLINICAL DATA:  Debridement of lumbar spine  EXAM: CT LUMBAR SPINE WITHOUT CONTRAST  TECHNIQUE: Multidetector CT imaging of the lumbar spine was performed without intravenous contrast administration. Multiplanar CT image reconstructions were also generated.  COMPARISON:  None.  FINDINGS: The alignment is anatomic. There is no acute fracture or static listhesis. There is no spondylolysis.  There is posterior spinal fusion from T10 through L5. There are vertical stabilizing rods which are in satisfactory position. There is a chronic burst fracture of the T12 vertebral body. There are laminectomies from L2 through L5. There is interbody disc material  from L2 through S1 without osseous incorporation. There has been interval removal of the bilateral sacral screws. There is air present within the surgical bed of the posterior elements from L2 through L5.  There are degenerative changes of bilateral SI joints.  IMPRESSION: 1. Interval removal of bilateral S1 hardware. 2. Extensive posterior spinal fusion from T10 through L5 with postsurgical changes in the posterior lumbar soft tissues.   Electronically Signed   By: Elige Ko   On: 06/21/2014 20:23  Mr Lumbar Spine W Wo Contrast  05/26/2014   CLINICAL DATA:  Thoracolumbar fusion with hardware 04/21/2014. Repeat operation for hematoma evacuation 05/11/2014. Now with increased back pain .  EXAM: MRI LUMBAR SPINE WITHOUT AND WITH CONTRAST  TECHNIQUE: Multiplanar and multiecho pulse sequences of the lumbar spine were obtained without and with intravenous contrast.  CONTRAST:  20mL MULTIHANCE GADOBENATE DIMEGLUMINE 529 MG/ML IV SOLN  COMPARISON:  Lumbar MRI 05/15/2014  FINDINGS: Extensive lumbar hardware is causing extensive artifact with limited anatomic detail as noted on prior studies.  Chronic compression fractures T12 and L1 are unchanged. Pedicle screw fusion with hardware from T11 through S1. Decompressive laminectomy T12 through L1.  Complex fluid collection is present posterior to the dura extending from L2 through L5. This is consistent with hematoma. This is smaller than that seen previously. The hematoma does indent the thecal sac and causes spinal stenosis most prominent at L3-4 and L4-5 however stenosis is not as severe as the prior MRI. Postcontrast imaging limited by a hardware. The fluid collection measures approximately 3 x 3 x 8 cm.  Subcutaneous fluid collection superficial to the fascia measures 2.5 x 8.3 cm on axial images and extends over 20 cm from T12 through the sacrum. This was present previously but appears improved. This could represent a chronic seroma or of CSF leak. The fluid does  track down to the hardware bilaterally at the L1 level. This is simple fluid and could represent CSF.  It is possible the 2 fluid collections are connected however they could be separate. Infection cannot be excluded.  IMPRESSION: Complex fluid collection extending down to the dura from L2 through L4 consistent with hematoma. This is causing moderate spinal stenosis. This collection is smaller than that seen on the prior MRI.  Subcutaneous fluid collection from T12 through the sacrum is also smaller and could represent a CSF collection. This fluid does track around the hardware at L1 suggestive of CSF leak.  Infected fluid not excluded.   Electronically Signed   By: Marlan Palau M.D.   On: 05/26/2014 19:51   Ct Abdomen Pelvis W Contrast  06/10/2014   CLINICAL DATA:  Severe left lower quadrant pain.  EXAM: CT ABDOMEN AND PELVIS WITH CONTRAST  TECHNIQUE: Multidetector CT imaging of the abdomen and pelvis was performed using the standard protocol following bolus administration of intravenous contrast.  CONTRAST:  OMNIPAQUE IOHEXOL 300 MG/ML  SOLN  COMPARISON:  MRI of the lumbar spine May 26, 2014  FINDINGS: LUNG BASES: Included view of the lung bases demonstrate small layering left pleural effusion. Visualized heart and pericardium are unremarkable.  SOLID ORGANS: Layering density in the gallbladder suggests sludge. Liver, spleen, pancreas and adrenal glands are nonsuspicious .  GASTROINTESTINAL TRACT: Distal stomach/ pyloric wall thickening with superimposed perforated fusion posteriorly, with free air. The small and large bowel are normal in course and caliber without inflammatory changes. Colonic diverticulosis. Normal appendix.  KIDNEYS/ URINARY TRACT: Kidneys are orthotopic, demonstrating symmetric enhancement. No nephrolithiasis, hydronephrosis or solid renal masses. The unopacified ureters are normal in course and caliber. Delayed imaging through the kidneys demonstrates symmetric prompt contrast  excretion within the proximal urinary collecting system. Urinary bladder is partially distended and unremarkable.  PERITONEUM/RETROPERITONEUM: Large amount of pneumoperitoneum anterior within the upper abdomen. Small a moderate amount of layering ascites without abscess. Aortoiliac vessels are normal in course and caliber, mild calcific atherosclerosis. No lymphadenopathy by CT size criteria. Internal reproductive organs are unremarkable.  SOFT TISSUE/OSSEOUS STRUCTURES: Status post T10  through S1 posterior instrumentation, at T12 chronic appearing burst fracture. L2 through L5 laminectomies with L2 through S1 interbody disc material, which does not appear incorporated. In addition, lucency about the S1 screws concerning for hardware failure possible infection. Peripherally calcified chronic appearing small lumbar paraspinal fluid collection without superimposed inflammatory changes.  IMPRESSION: Perforated gastric pyloric ulcer with moderate amount of pneumoperitoneum and small to moderate amount of ascites without abscess.  Colonic diverticulosis without convincing evidence of acute diverticulitis.  Extensive spinal surgery, with S1 screws periprosthetic lucency consistent with hardware failure.  Findings discussed with and reconfirmed by Dr.KEVIN CAMPOS on9/11/2015at2:45 am.   Electronically Signed   By: Awilda Metro   On: 06/10/2014 02:56   Dg Chest Port 1 View  06/12/2014   CLINICAL DATA:  Evaluate atelectasis.  EXAM: PORTABLE CHEST - 1 VIEW  COMPARISON:  Chest x-ray 06/11/2014.  FINDINGS: A nasogastric tube is seen extending into the stomach, however, the tip of the nasogastric tube extends below the lower margin of the image. Lung volumes are low. No consolidative airspace disease. No pleural effusions. Improved aeration at the left base, compatible with resolution of atelectasis noted on the prior examination. No evidence of pulmonary edema. Heart size is normal. Upper mediastinal contours are within  normal limits. Multiple old healed right-sided rib fractures are incidentally noted.  IMPRESSION: 1. Support apparatus, as above. 2. Low lung volumes with improving bibasilar aeration, particularly in the left.   Electronically Signed   By: Trudie Reed M.D.   On: 06/12/2014 08:37   Dg Chest Port 1 View  06/11/2014   CLINICAL DATA:  Respiratory failure  EXAM: PORTABLE CHEST - 1 VIEW  COMPARISON:  05/31/2014  FINDINGS: Very limited inspiratory effect. Endotracheal tube and NG tube unchanged. Right lung is clear. Retrocardiac left lower lobe opacity is increased when compared to prior study.  IMPRESSION: Increased retrocardiac left lower lobe opacification consistent with lower lobe consolidation.   Electronically Signed   By: Esperanza Heir M.D.   On: 06/11/2014 08:24   Dg Chest Port 1 View  06/10/2014   CLINICAL DATA:  Check endotracheal tube placement  EXAM: PORTABLE CHEST - 1 VIEW  COMPARISON:  04/19/2014  FINDINGS: Cardiac shadow is mildly prominent. A nasogastric catheter is seen within the stomach. An endotracheal tube is noted with the tip 3.4 cm above the carina. The lungs are clear. No acute bony abnormality is noted. Postsurgical changes in the thoracic and lumbar spine are seen.  IMPRESSION: Endotracheal tube and nasogastric catheter in satisfactory position. No acute abnormality noted.  These results will be called to the ordering clinician or representative by the Radiologist Assistant, and communication documented in the PACS or zVision Dashboard.   Electronically Signed   By: Alcide Clever M.D.   On: 06/10/2014 09:38   Dg Kayleen Memos W/water Sol Cm  06/14/2014   CLINICAL DATA:  Gastric perforation status post repair  EXAM: WATER SOLUBLE UPPER GI SERIES  TECHNIQUE: Single-column upper GI series was performed using water soluble contrast.  CONTRAST:  Water-soluble contrast.  150 cc Omnipaque  COMPARISON:  CT 06/10/2014  FLUOROSCOPY TIME:  1 min 7 seconds  FINDINGS: Water-soluble contrast was hand  injected through the nasogastric tube. Contrast fills the gastric fundus and body without evidence of extraluminal leakage of the contrast material. Contrast flows into the proximal small bowel.  IMPRESSION: No evidence of leakage of the contrast with  filling of the stomach.   Electronically Signed   By: Loura Halt.D.  On: 06/14/2014 15:35    Medications: Scheduled Meds: . sodium chloride   Intravenous Once  . sodium chloride   Intravenous Once  . antiseptic oral rinse  7 mL Mouth Rinse QID  . dextrose  50 mL Intravenous Once  . feeding supplement (ENSURE COMPLETE)  237 mL Oral TID BM  . feeding supplement (PRO-STAT SUGAR FREE 64)  30 mL Oral BID WC  . furosemide  40 mg Intravenous Q12H  . glucagon (human recombinant)  1 mg Intravenous Once  . pantoprazole  40 mg Oral BID  . potassium chloride  40 mEq Oral Daily  . rifampin  300 mg Oral Daily  . sodium chloride  3 mL Intravenous Q12H  . vancomycin  1,250 mg Intravenous Q12H      LOS: 15 days   RAI,RIPUDEEP M.D. [facility] 06/25/2014, 10:35 AM Pager: [phone]  If 7PM-7AM, please contact night-coverage [email or web address] Password [email or web address]

## 2014-06-25 NOTE — Progress Notes (Signed)
Patient ID: Devin [name], male   DOB: April 03, 1971, 43 y.o.   MRN: [id number] Vital signs are stable Peripheral edema still substantial Back pain under reasonable control Receiving IV antibiotics Appreciate help of hospitalists Weight placement in SNF

## 2014-06-26 DIAGNOSIS — M79609 Pain in unspecified limb: Secondary | ICD-10-CM

## 2014-06-26 DIAGNOSIS — M7989 Other specified soft tissue disorders: Secondary | ICD-10-CM

## 2014-06-26 LAB — COMPREHENSIVE METABOLIC PANEL
ALBUMIN: 1.6 g/dL — AB (ref 3.5–5.2)
ALK PHOS: 126 U/L — AB (ref 39–117)
ALT: 6 U/L (ref 0–53)
AST: 9 U/L (ref 0–37)
Anion gap: 12 (ref 5–15)
BUN: 10 mg/dL (ref 6–23)
CHLORIDE: 102 meq/L (ref 96–112)
CO2: 26 meq/L (ref 19–32)
Calcium: 8.1 mg/dL — ABNORMAL LOW (ref 8.4–10.5)
Creatinine, Ser: 0.72 mg/dL (ref 0.50–1.35)
GFR calc Af Amer: >90 mL/min (ref >90)
GFR calc non Af Amer: >90 mL/min (ref >90)
Glucose, Bld: 92 mg/dL (ref 70–99)
Potassium: 3.7 mEq/L (ref 3.7–5.3)
Sodium: 140 mEq/L (ref 137–147)
Total Protein: 7.1 g/dL (ref 6.0–8.3)

## 2014-06-26 LAB — CBC
HCT: 25 % — ABNORMAL LOW (ref 39.0–52.0)
Hemoglobin: 7.6 g/dL — ABNORMAL LOW (ref 13.0–17.0)
MCH: 28 pg (ref 26.0–34.0)
MCHC: 30.4 g/dL (ref 30.0–36.0)
MCV: 92.3 fL (ref 78.0–100.0)
Platelets: 557 10*3/uL — ABNORMAL HIGH (ref 150–400)
RBC: 2.71 MIL/uL — ABNORMAL LOW (ref 4.22–5.81)
RDW: 19.9 % — AB (ref 11.5–15.5)
WBC: 12.1 10*3/uL — ABNORMAL HIGH (ref 4.0–10.5)

## 2014-06-26 LAB — RETICULOCYTES
RBC.: 2.98 MIL/uL — AB (ref 4.22–5.81)
RETIC COUNT ABSOLUTE: 23.8 10*3/uL (ref 19.0–186.0)
RETIC CT PCT: 0.8 % (ref 0.4–3.1)

## 2014-06-26 LAB — PREALBUMIN: Prealbumin: 9.1 mg/dL — ABNORMAL LOW (ref 17.0–34.0)

## 2014-06-26 LAB — PREPARE RBC (CROSSMATCH)

## 2014-06-26 LAB — HEMOGLOBIN AND HEMATOCRIT, BLOOD
HEMATOCRIT: 26.7 % — AB (ref 39.0–52.0)
Hemoglobin: 8.3 g/dL — ABNORMAL LOW (ref 13.0–17.0)

## 2014-06-26 LAB — PHOSPHORUS: Phosphorus: 4.2 mg/dL (ref 2.3–4.6)

## 2014-06-26 MED ORDER — ALBUMIN HUMAN 25 % IV SOLN
12.5000 g | Freq: Once | INTRAVENOUS | Status: AC
  Administered 2014-06-26: 12.5 g via INTRAVENOUS
  Filled 2014-06-26: qty 50

## 2014-06-26 MED ORDER — SODIUM CHLORIDE 0.9 % IV SOLN: Freq: Once | INTRAVENOUS | Status: DC

## 2014-06-26 MED ORDER — FUROSEMIDE 10 MG/ML IJ SOLN
40.0000 mg | Freq: Three times a day (TID) | INTRAMUSCULAR | Status: DC
  Administered 2014-06-26 – 2014-06-29 (×9): 40 mg via INTRAVENOUS
  Filled 2014-06-26 (×12): qty 4

## 2014-06-26 NOTE — Progress Notes (Signed)
Patient ID: Devin [name], male   DOB: October 18, 1970, 43 y.o.   MRN: [id number] Vital signs are stable Edema is decreasing Appreciate help of hospitalist service

## 2014-06-26 NOTE — Progress Notes (Signed)
*  Preliminary Results* Bilateral lower extremity venous duplex completed. Visualized veins of bilateral lower extremities are negative for deep vein thrombosis. There is no evidence of Baker's cyst bilaterally.  06/26/2014  Gertie Fey, RVT, RDCS, RDMS

## 2014-06-26 NOTE — Progress Notes (Signed)
Patient ID: Devin [name]  male  ZOX:[id number]    DOB: 1971-04-10    DOA: 06/10/2014  PCP: No PCP Per Patient                                          CONSULT  PROGRESS NOTE   Brief interim summary 43 yo M who has been undergoing treatment by the neurosurgery team for back issues, underwent lumbar fusion earlier in the summer and had several evacuations of hematomas. He went to the hospital in [place] and was seen to have another fluid collection in his surgical site. He was transferred here to be admitted by them for treatment.  However, when the EDP evaluated him, he was found to be tachycardic and tender on his abdominal exam. CT was performed, and this demonstrated free air and finding consistent with a perforated ulcer. This goes along with his history as he has been taking narcotics, and many NSAIDs. Patient has had surgical repair of his peptic ulcer and also another evacuation of infected hematoma by neurosurgery. For the last 2 weeks patient nutrition has been poor and albumin is 1.6. Patient is fluid overload and neurosurgery has consulted IM for assistance in managing his fluid overload.   Assessment/Plan:  Fluid overload, bilateral peripheral edema: secondary to hypoalbuminemia, albumin 1.6, 3rd shifting and fluid resuscitation during multiple surgeries - After starting Lasix, 1.69 L negative balance however patient continues to have pain and reportedly oozing from legs yesterday. He does not like the current size of TED hoses, states it is difficult to put on. Some patients with hypoalbuminemia are relatively resistant to conventional diuretic therapy, his albumin is only 1.6. If correcting with nutrition, it will likely take some time to build up his protein and albumin stores. Will try albumin infusion today with IV Lasix inc to TID to increase diuretic delivery to the kidneys by keeping furosemide within the vascular space, hopefully, may lead to more sodium excretion.  - Monitor  BMET daily - Nutrition consult done, continue ensure TID and continue prostat  - cont sodium diet.  - Doppler ultrasound of the lower extremities to rule out any DVT  Back pain s/p multiple surgeries, wound infection, s/p debridement, infected hematoma  - management per primary team, neurosurgery - Infectious disease following, on vancomycin and rifampin, continue for another 40 days, ID followup in 3-4 weeks post hosp  Perforated gastric ulcer: Status post exploratory laparotomy and repair of perforated pyloric channel ulcer -  Tolerating oral diet, stable from surgical standpoint per Dr Corliss Skains  DM type 2 (diabetes mellitus, type 2): A1C 5.1;  - continue diet controlled   HTN: stable and well controlled.  - Continue current regimen   Anemia - Hemoglobin down to 7.6, Will transfuse 1 unit packed RBCs - Check anemia panel, FOBT   DVT Prophylaxis:  Code Status:  Family Communication:  Disposition: per Primary team     Subjective: Patient seen and examined, feels his legs are tight and oozing yesterday, does not keep the legs elevated, states that pain gets worse  Objective: Weight change:   Intake/Output Summary (Last 24 hours) at 06/26/14 1049 Last data filed at 06/26/14 0900  Gross per 24 hour  Intake    240 ml  Output   4550 ml  Net  -4310 ml   Blood pressure 145/95, pulse 104, temperature 98.2 F (36.8 C), temperature  source Oral, resp. rate 18, height  (1.778 m), weight 103.9 kg (229 lb 0.9 oz), SpO2 98.00%.  Physical Exam: General: Alert and awake, oriented x3, not in any acute distress. CVS: S1-S2 clear, no murmur rubs or gallops Chest: clear to auscultation bilaterally, no wheezing, rales or rhonchi Abdomen: soft nontender, nondistended, normal bowel sounds  Extremities: no cyanosis, clubbing, 2-3+ edema noted bilaterally   Lab Results: Basic Metabolic Panel:  Recent Labs Lab 06/24/14 1500 06/25/14 0358 06/26/14 0500  NA 136* 139 140  K  3.8 3.7 3.7  CL 100 101 102  CO2 GLUCOSE 94 120* 92  BUN CREATININE 0.82 0.66 0.72  CALCIUM 9.0 8.0* 8.1*  MG 2.0  --   --   PHOS  --   --  4.2   Liver Function Tests:  Recent Labs Lab 06/21/14 2118 06/26/14 0500  AST 15 9  ALT 5 6  ALKPHOS 112 126*  BILITOT 0.4 <0.2*  PROT 7.0 7.1  ALBUMIN 1.6* 1.6*   No results found for this basename: LIPASE, AMYLASE,  in the last 168 hours No results found for this basename: AMMONIA,  in the last 168 hours CBC:  Recent Labs Lab 06/25/14 0358 06/26/14 0500  WBC 13.4* 12.1*  HGB 7.7* 7.6*  HCT 25.2* 25.0*  MCV 91.0 92.3  PLT 613* 557*   Cardiac Enzymes:  Recent Labs Lab 06/21/14 2118  TROPONINI <0.30   BNP: No components found with this basename: POCBNP,  CBG:  Recent Labs Lab 06/22/14 1948 06/23/14 0011 06/23/14 0405 06/23/14 0743 06/23/14 1152  GLUCAP 109* 104* 90 88 108*     Micro Results: Recent Results (from the past 240 hour(s))  WOUND CULTURE     Status: None   Collection Time    06/17/14  4:57 PM      Result Value Ref Range Status   Specimen Description WOUND BACK   Final   Special Requests NONE   Final   Gram Stain     Final   Value: FEW WBC PRESENT,BOTH PMN AND MONONUCLEAR     NO SQUAMOUS EPITHELIAL CELLS SEEN     NO ORGANISMS SEEN     Performed at [facility]   Culture     Final   Value: NO GROWTH 2 DAYS     Performed at [facility]   Report Status 06/20/2014 FINAL   Final  ANAEROBIC CULTURE     Status: None   Collection Time    06/17/14  4:57 PM      Result Value Ref Range Status   Specimen Description WOUND BACK   Final   Special Requests NONE   Final   Gram Stain     Final   Value: FEW WBC PRESENT,BOTH PMN AND MONONUCLEAR     NO SQUAMOUS EPITHELIAL CELLS SEEN     NO ORGANISMS SEEN     Performed at [facility]   Culture     Final   Value: NO ANAEROBES ISOLATED     Performed at [facility]   Report Status 06/22/2014 FINAL    Final    Studies/Results: Ct Lumbar Spine Wo Contrast  06/21/2014   CLINICAL DATA:  Debridement of lumbar spine  EXAM: CT LUMBAR SPINE WITHOUT CONTRAST  TECHNIQUE: Multidetector CT imaging of the lumbar spine was performed without intravenous contrast administration. Multiplanar CT image reconstructions were also generated.  COMPARISON:  None.  FINDINGS: The alignment  is anatomic. There is no acute fracture or static listhesis. There is no spondylolysis.  There is posterior spinal fusion from T10 through L5. There are vertical stabilizing rods which are in satisfactory position. There is a chronic burst fracture of the T12 vertebral body. There are laminectomies from L2 through L5. There is interbody disc material from L2 through S1 without osseous incorporation. There has been interval removal of the bilateral sacral screws. There is air present within the surgical bed of the posterior elements from L2 through L5.  There are degenerative changes of bilateral SI joints.  IMPRESSION: 1. Interval removal of bilateral S1 hardware. 2. Extensive posterior spinal fusion from T10 through L5 with postsurgical changes in the posterior lumbar soft tissues.   Electronically Signed   By: Elige Ko   On: 06/21/2014 20:23   Mr Lumbar Spine W Wo Contrast  05/26/2014   CLINICAL DATA:  Thoracolumbar fusion with hardware 04/21/2014. Repeat operation for hematoma evacuation 05/11/2014. Now with increased back pain .  EXAM: MRI LUMBAR SPINE WITHOUT AND WITH CONTRAST  TECHNIQUE: Multiplanar and multiecho pulse sequences of the lumbar spine were obtained without and with intravenous contrast.  CONTRAST:  20mL MULTIHANCE GADOBENATE DIMEGLUMINE 529 MG/ML IV SOLN  COMPARISON:  Lumbar MRI 05/15/2014  FINDINGS: Extensive lumbar hardware is causing extensive artifact with limited anatomic detail as noted on prior studies.  Chronic compression fractures T12 and L1 are unchanged. Pedicle screw fusion with hardware from T11 through S1.  Decompressive laminectomy T12 through L1.  Complex fluid collection is present posterior to the dura extending from L2 through L5. This is consistent with hematoma. This is smaller than that seen previously. The hematoma does indent the thecal sac and causes spinal stenosis most prominent at L3-4 and L4-5 however stenosis is not as severe as the prior MRI. Postcontrast imaging limited by a hardware. The fluid collection measures approximately 3 x 3 x 8 cm.  Subcutaneous fluid collection superficial to the fascia measures 2.5 x 8.3 cm on axial images and extends over 20 cm from T12 through the sacrum. This was present previously but appears improved. This could represent a chronic seroma or of CSF leak. The fluid does track down to the hardware bilaterally at the L1 level. This is simple fluid and could represent CSF.  It is possible the 2 fluid collections are connected however they could be separate. Infection cannot be excluded.  IMPRESSION: Complex fluid collection extending down to the dura from L2 through L4 consistent with hematoma. This is causing moderate spinal stenosis. This collection is smaller than that seen on the prior MRI.  Subcutaneous fluid collection from T12 through the sacrum is also smaller and could represent a CSF collection. This fluid does track around the hardware at L1 suggestive of CSF leak.  Infected fluid not excluded.   Electronically Signed   By: Marlan Palau M.D.   On: 05/26/2014 19:51   Ct Abdomen Pelvis W Contrast  06/10/2014   CLINICAL DATA:  Severe left lower quadrant pain.  EXAM: CT ABDOMEN AND PELVIS WITH CONTRAST  TECHNIQUE: Multidetector CT imaging of the abdomen and pelvis was performed using the standard protocol following bolus administration of intravenous contrast.  CONTRAST:  OMNIPAQUE IOHEXOL 300 MG/ML  SOLN  COMPARISON:  MRI of the lumbar spine May 26, 2014  FINDINGS: LUNG BASES: Included view of the lung bases demonstrate small layering left pleural  effusion. Visualized heart and pericardium are unremarkable.  SOLID ORGANS: Layering density in the gallbladder  suggests sludge. Liver, spleen, pancreas and adrenal glands are nonsuspicious .  GASTROINTESTINAL TRACT: Distal stomach/ pyloric wall thickening with superimposed perforated fusion posteriorly, with free air. The small and large bowel are normal in course and caliber without inflammatory changes. Colonic diverticulosis. Normal appendix.  KIDNEYS/ URINARY TRACT: Kidneys are orthotopic, demonstrating symmetric enhancement. No nephrolithiasis, hydronephrosis or solid renal masses. The unopacified ureters are normal in course and caliber. Delayed imaging through the kidneys demonstrates symmetric prompt contrast excretion within the proximal urinary collecting system. Urinary bladder is partially distended and unremarkable.  PERITONEUM/RETROPERITONEUM: Large amount of pneumoperitoneum anterior within the upper abdomen. Small a moderate amount of layering ascites without abscess. Aortoiliac vessels are normal in course and caliber, mild calcific atherosclerosis. No lymphadenopathy by CT size criteria. Internal reproductive organs are unremarkable.  SOFT TISSUE/OSSEOUS STRUCTURES: Status post T10 through S1 posterior instrumentation, at T12 chronic appearing burst fracture. L2 through L5 laminectomies with L2 through S1 interbody disc material, which does not appear incorporated. In addition, lucency about the S1 screws concerning for hardware failure possible infection. Peripherally calcified chronic appearing small lumbar paraspinal fluid collection without superimposed inflammatory changes.  IMPRESSION: Perforated gastric pyloric ulcer with moderate amount of pneumoperitoneum and small to moderate amount of ascites without abscess.  Colonic diverticulosis without convincing evidence of acute diverticulitis.  Extensive spinal surgery, with S1 screws periprosthetic lucency consistent with hardware failure.   Findings discussed with and reconfirmed by Dr.KEVIN CAMPOS on9/11/2015at2:45 am.   Electronically Signed   By: Awilda Metro   On: 06/10/2014 02:56   Dg Chest Port 1 View  06/12/2014   CLINICAL DATA:  Evaluate atelectasis.  EXAM: PORTABLE CHEST - 1 VIEW  COMPARISON:  Chest x-ray 06/11/2014.  FINDINGS: A nasogastric tube is seen extending into the stomach, however, the tip of the nasogastric tube extends below the lower margin of the image. Lung volumes are low. No consolidative airspace disease. No pleural effusions. Improved aeration at the left base, compatible with resolution of atelectasis noted on the prior examination. No evidence of pulmonary edema. Heart size is normal. Upper mediastinal contours are within normal limits. Multiple old healed right-sided rib fractures are incidentally noted.  IMPRESSION: 1. Support apparatus, as above. 2. Low lung volumes with improving bibasilar aeration, particularly in the left.   Electronically Signed   By: Trudie Reed M.D.   On: 06/12/2014 08:37   Dg Chest Port 1 View  06/11/2014   CLINICAL DATA:  Respiratory failure  EXAM: PORTABLE CHEST - 1 VIEW  COMPARISON:  05/31/2014  FINDINGS: Very limited inspiratory effect. Endotracheal tube and NG tube unchanged. Right lung is clear. Retrocardiac left lower lobe opacity is increased when compared to prior study.  IMPRESSION: Increased retrocardiac left lower lobe opacification consistent with lower lobe consolidation.   Electronically Signed   By: Esperanza Heir M.D.   On: 06/11/2014 08:24   Dg Chest Port 1 View  06/10/2014   CLINICAL DATA:  Check endotracheal tube placement  EXAM: PORTABLE CHEST - 1 VIEW  COMPARISON:  04/19/2014  FINDINGS: Cardiac shadow is mildly prominent. A nasogastric catheter is seen within the stomach. An endotracheal tube is noted with the tip 3.4 cm above the carina. The lungs are clear. No acute bony abnormality is noted. Postsurgical changes in the thoracic and lumbar spine are  seen.  IMPRESSION: Endotracheal tube and nasogastric catheter in satisfactory position. No acute abnormality noted.  These results will be called to the ordering clinician or representative by the Radiologist Assistant, and communication  documented in the PACS or zVision Dashboard.   Electronically Signed   By: Alcide Clever M.D.   On: 06/10/2014 09:38   Dg Kayleen Memos W/water Sol Cm  06/14/2014   CLINICAL DATA:  Gastric perforation status post repair  EXAM: WATER SOLUBLE UPPER GI SERIES  TECHNIQUE: Single-column upper GI series was performed using water soluble contrast.  CONTRAST:  Water-soluble contrast.  150 cc Omnipaque  COMPARISON:  CT 06/10/2014  FLUOROSCOPY TIME:  1 min 7 seconds  FINDINGS: Water-soluble contrast was hand injected through the nasogastric tube. Contrast fills the gastric fundus and body without evidence of extraluminal leakage of the contrast material. Contrast flows into the proximal small bowel.  IMPRESSION: No evidence of leakage of the contrast with  filling of the stomach.   Electronically Signed   By: Genevive Bi M.D.   On: 06/14/2014 15:35    Medications: Scheduled Meds: . sodium chloride   Intravenous Once  . sodium chloride   Intravenous Once  . sodium chloride   Intravenous Once  . antiseptic oral rinse  7 mL Mouth Rinse QID  . dextrose  50 mL Intravenous Once  . feeding supplement (PRO-STAT SUGAR FREE 64)  30 mL Oral BID WC  . furosemide  40 mg Intravenous 3 times per day  . glucagon (human recombinant)  1 mg Intravenous Once  . multivitamin with minerals  1 tablet Oral Daily  . pantoprazole  40 mg Oral BID  . potassium chloride  40 mEq Oral Daily  . rifampin  300 mg Oral Daily  . sodium chloride  3 mL Intravenous Q12H  . vancomycin  1,250 mg Intravenous Q12H      LOS: 16 days   RAI,RIPUDEEP M.D. [facility] 06/26/2014, 10:49 AM Pager: [phone]  If 7PM-7AM, please contact night-coverage [email or web address] Password [email or web address]

## 2014-06-27 LAB — CBC
HCT: 24.6 % — ABNORMAL LOW (ref 39.0–52.0)
Hemoglobin: 7.6 g/dL — ABNORMAL LOW (ref 13.0–17.0)
MCH: 27.9 pg (ref 26.0–34.0)
MCHC: 30.9 g/dL (ref 30.0–36.0)
MCV: 90.4 fL (ref 78.0–100.0)
PLATELETS: 497 10*3/uL — AB (ref 150–400)
RBC: 2.72 MIL/uL — ABNORMAL LOW (ref 4.22–5.81)
RDW: 19.1 % — AB (ref 11.5–15.5)
WBC: 10.7 10*3/uL — ABNORMAL HIGH (ref 4.0–10.5)

## 2014-06-27 LAB — ALBUMIN: ALBUMIN: 1.7 g/dL — AB (ref 3.5–5.2)

## 2014-06-27 LAB — BASIC METABOLIC PANEL
ANION GAP: 9 (ref 5–15)
BUN: 12 mg/dL (ref 6–23)
CALCIUM: 8.1 mg/dL — AB (ref 8.4–10.5)
CO2: 29 mEq/L (ref 19–32)
Chloride: 101 mEq/L (ref 96–112)
Creatinine, Ser: 0.77 mg/dL (ref 0.50–1.35)
GLUCOSE: 90 mg/dL (ref 70–99)
POTASSIUM: 3.9 meq/L (ref 3.7–5.3)
SODIUM: 139 meq/L (ref 137–147)

## 2014-06-27 LAB — URINALYSIS, ROUTINE W REFLEX MICROSCOPIC
BILIRUBIN URINE: NEGATIVE
Glucose, UA: NEGATIVE mg/dL
HGB URINE DIPSTICK: NEGATIVE
Ketones, ur: NEGATIVE mg/dL
Leukocytes, UA: NEGATIVE
Nitrite: NEGATIVE
Protein, ur: NEGATIVE mg/dL
Specific Gravity, Urine: 1.016 (ref 1.005–1.030)
UROBILINOGEN UA: 0.2 mg/dL (ref 0.0–1.0)
pH: 6.5 (ref 5.0–8.0)

## 2014-06-27 LAB — IRON AND TIBC
Iron: 15 ug/dL — ABNORMAL LOW (ref 42–135)
Saturation Ratios: 12 % — ABNORMAL LOW (ref 20–55)
TIBC: 127 ug/dL — ABNORMAL LOW (ref 215–435)
UIBC: 112 ug/dL — ABNORMAL LOW (ref 125–400)

## 2014-06-27 LAB — VANCOMYCIN, TROUGH: Vancomycin Tr: 21.9 ug/mL — ABNORMAL HIGH (ref 10.0–20.0)

## 2014-06-27 LAB — VITAMIN B12: Vitamin B-12: 787 pg/mL (ref 211–911)

## 2014-06-27 LAB — TYPE AND SCREEN
ABO/RH(D): O POS
Antibody Screen: NEGATIVE
Unit division: 0

## 2014-06-27 LAB — FERRITIN: Ferritin: 258 ng/mL (ref 22–322)

## 2014-06-27 LAB — FOLATE: Folate: 10.3 ng/mL

## 2014-06-27 MED ORDER — VANCOMYCIN HCL IN DEXTROSE 1-5 GM/200ML-% IV SOLN
1000.0000 mg | Freq: Two times a day (BID) | INTRAVENOUS | Status: DC
  Administered 2014-06-28 – 2014-07-02 (×10): 1000 mg via INTRAVENOUS
  Filled 2014-06-27 (×11): qty 200

## 2014-06-27 MED ORDER — METOLAZONE 5 MG PO TABS
5.0000 mg | ORAL_TABLET | Freq: Every day | ORAL | Status: DC
  Administered 2014-06-27 – 2014-06-29 (×3): 5 mg via ORAL
  Filled 2014-06-27 (×3): qty 1

## 2014-06-27 MED ORDER — ALBUMIN HUMAN 25 % IV SOLN
25.0000 g | Freq: Every day | INTRAVENOUS | Status: DC
  Administered 2014-06-27: 25 g via INTRAVENOUS
  Filled 2014-06-27 (×3): qty 100

## 2014-06-27 MED ORDER — RIFAMPIN 300 MG PO CAPS
300.0000 mg | ORAL_CAPSULE | Freq: Two times a day (BID) | ORAL | Status: DC
  Administered 2014-06-27 – 2014-07-02 (×10): 300 mg via ORAL
  Filled 2014-06-27 (×13): qty 1

## 2014-06-27 NOTE — Progress Notes (Signed)
Patient ID: Devin [name], male   DOB: July 03, 1971, 43 y.o.   MRN: [id number] Leg pain and bilateral leg edema still an issue Motor function remained stable Will require skilled nursing home placement during time of IV antibiotics.

## 2014-06-27 NOTE — Progress Notes (Signed)
Wound clean. He is concerned about his leg swelling which is being evaluated by the medical team. Patient examined and I agree with the assessment and plan  Violeta Gelinas, MD, MPH, FACS Trauma: [phone] General Surgery: [phone]  06/27/2014 1:47 PM

## 2014-06-27 NOTE — Progress Notes (Addendum)
ANTIBIOTIC CONSULT NOTE - FOLLOW UP  Pharmacy Consult:  Vancomycin Indication:  CSF culture positive for MRSA  Allergies  Allergen Reactions  . Codeine Other (See Comments)    Skin "crawl"  . Hydrocodone Other (See Comments)    Skin "crawl"  . Other Nausea Only and Other (See Comments)    Darvocet    Patient Measurements: Height:  (177.8 cm) Weight: 229 lb 0.9 oz (103.9 kg) IBW/kg (Calculated) : 73  Vital Signs: Temp: 99 F (37.2 C) (09/28 0721) Temp src: Oral (09/28 0721) BP: 138/81 mmHg (09/28 0721) Pulse Rate: 103 (09/28 0721) Intake/Output from previous day: 09/27 0701 - 09/28 0700 In: 720 [P.O.:720] Out: 3900 [Urine:3900] Intake/Output from this shift: Total I/O In: 350 [P.O.:350] Out: 1000 [Urine:1000]  Labs:  Recent Labs  06/25/14 0358 06/26/14 0500 06/26/14 1755 06/27/14 0455  WBC 13.4* 12.1*  --  10.7*  HGB 7.7* 7.6* 8.3* 7.6*  PLT 613* 557*  --  497*  CREATININE 0.66 0.72  --  0.77   Estimated Creatinine Clearance: 140.9 ml/min (by C-G formula based on Cr of 0.77).  Recent Labs  06/27/14 1142  VANCOTROUGH 21.9*     Microbiology: Recent Results (from the past 720 hour(s))  MRSA PCR SCREENING     Status: Abnormal   Collection Time    06/10/14  9:14 AM      Result Value Ref Range Status   MRSA by PCR POSITIVE (*) NEGATIVE Final   Comment:            The GeneXpert MRSA Assay (FDA     approved for NASAL specimens     only), is one component of a     comprehensive MRSA colonization     surveillance program. It is not     intended to diagnose MRSA     infection nor to guide or     monitor treatment for     MRSA infections.     RESULT CALLED TO, READ BACK BY AND VERIFIED WITH:     Ricci Barker RN 11:30 06/10/14 ([email or web address])  URINE CULTURE     Status: None   Collection Time    06/10/14  9:14 AM      Result Value Ref Range Status   Specimen Description URINE, CATHETERIZED   Final   Special Requests Normal   Final   Culture  Setup Time      Final   Value: 06/10/2014 17:14     Performed at [facility] Count     Final   Value: NO GROWTH     Performed at [facility]   Culture     Final   Value: NO GROWTH     Performed at [facility]   Report Status 06/11/2014 FINAL   Final  WOUND CULTURE     Status: None   Collection Time    06/14/14  6:40 PM      Result Value Ref Range Status   Specimen Description WOUND   Final   Special Requests PUS FROM BACK WOUND   Final   Gram Stain     Final   Value: ABUNDANT WBC PRESENT,BOTH PMN AND MONONUCLEAR     NO ORGANISMS SEEN     Performed at [facility]     Performed at [facility]   Culture     Final   Value: FEW METHICILLIN RESISTANT STAPHYLOCOCCUS AUREUS     Note: RIFAMPIN AND GENTAMICIN  SHOULD NOT BE USED AS SINGLE DRUGS FOR TREATMENT OF STAPH INFECTIONS. This organism DOES NOT demonstrate inducible Clindamycin resistance in vitro. CRITICAL RESULT CALLED TO, READ BACK BY AND VERIFIED WITH: TARA CROSS      06/17/14 1300 BY SMITH     Performed at [facility]   Report Status 06/22/2014 FINAL   Final   Organism ID, Bacteria METHICILLIN RESISTANT STAPHYLOCOCCUS AUREUS   Final  GRAM STAIN     Status: None   Collection Time    06/14/14  6:40 PM      Result Value Ref Range Status   Specimen Description WOUND   Final   Special Requests PUS FROM BACK WOUND   Final   Gram Stain     Final   Value: ABUNDANT WBC PRESENT,BOTH PMN AND MONONUCLEAR     NO ORGANISMS SEEN   Report Status 06/22/2014 FINAL   Final  WOUND CULTURE     Status: None   Collection Time    06/17/14  4:57 PM      Result Value Ref Range Status   Specimen Description WOUND BACK   Final   Special Requests NONE   Final   Gram Stain     Final   Value: FEW WBC PRESENT,BOTH PMN AND MONONUCLEAR     NO SQUAMOUS EPITHELIAL CELLS SEEN     NO ORGANISMS SEEN     Performed at [facility]   Culture     Final   Value: NO GROWTH 2 DAYS     Performed at [facility]   Report Status 06/20/2014 FINAL   Final  ANAEROBIC CULTURE     Status: None   Collection Time    06/17/14  4:57 PM      Result Value Ref Range Status   Specimen Description WOUND BACK   Final   Special Requests NONE   Final   Gram Stain     Final   Value: FEW WBC PRESENT,BOTH PMN AND MONONUCLEAR     NO SQUAMOUS EPITHELIAL CELLS SEEN     NO ORGANISMS SEEN     Performed at [facility]   Culture     Final   Value: NO ANAEROBES ISOLATED     Performed at [facility]   Report Status 06/22/2014 FINAL   Final    Assessment: 43 YOM continues on vancomycin for positive CSF culture for MRSA and peritonitis.  He is s/p OR for debridment of lumbar wound.  Last vancomycin trough was sub-therapeutic and dose was adjusted. However, a trough was checked today and the new dose and it is a bit high. It may be accumulating a bit.  Vanc 9/11 >> 9/13, resumed 9/19 >> Rifampin 9/22 >>  Zosyn 9/11 >> 9/21 Fluc 9/12 >> 9/18  9/22 VT = 13.6 mcg/mL on 1g q12 >> incr  q12  9/11 urine - negative MRSA PCR - positive 9/15 CSF - few MRSA 9/18 wound culture - NGTD  Goal of Therapy:  Vancomycin trough level 15-20 mcg/ml  Plan:  - Change vanc to 1gm IV Q12H - check trough at [facility] - Continue rifampin  PO daily per MD - Monitor renal fxn, clinical course, weekly VT to r/o accumulation  Lysle Pearl, PharmD, BCPS Pager # [phone] 06/27/2014 12:48 PM  Addendum:  Will adjust rifampin to  BID  Ulyses Southward, PharmD Pager: [phone] 06/27/2014 1:33 PM

## 2014-06-27 NOTE — Progress Notes (Signed)
Patient ID: Devin [name], male   DOB: 1971/03/18, 43 y.o.   MRN: [id number] 10 Days Post-Op  Subjective: Pt doing well.  Still c/o issues with swelling in his legs, but otherwise ok  Objective: Vital signs in last 24 hours: Temp:  [98.4 F (36.9 C)-99.9 F (37.7 C)] 99 F (37.2 C) (09/28 0721) Pulse Rate:  [100-108] 103 (09/28 0721) Resp:  [16-20] 16 (09/28 0721) BP: (133-146)/(77-89) 138/81 mmHg (09/28 0721) SpO2:  [96 %-100 %] 97 % (09/28 0721) Last BM Date: 06/27/14  Intake/Output from previous day: 09/27 0701 - 09/28 0700 In: 720 [P.O.:720] Out: 3900 [Urine:3900] Intake/Output this shift: Total I/O In: 350 [P.O.:350] Out: 1000 [Urine:1000]  PE: Abd: soft, NT, ND, midline wound is clean and packed.  100% granulation tissue  Lab Results:   Recent Labs  06/26/14 0500 06/26/14 1755 06/27/14 0455  WBC 12.1*  --  10.7*  HGB 7.6* 8.3* 7.6*  HCT 25.0* 26.7* 24.6*  PLT 557*  --  497*   BMET  Recent Labs  06/26/14 0500 06/27/14 0455  NA 140 139  K 3.7 3.9  CL 102 101  CO2 26 29  GLUCOSE 92 90  BUN 10 12  CREATININE 0.72 0.77  CALCIUM 8.1* 8.1*   PT/INR No results found for this basename: LABPROT, INR,  in the last 72 hours CMP     Component Value Date/Time   NA 139 06/27/2014 0455   K 3.9 06/27/2014 0455   CL 101 06/27/2014 0455   CO2 29 06/27/2014 0455   GLUCOSE 90 06/27/2014 0455   BUN 12 06/27/2014 0455   CREATININE 0.77 06/27/2014 0455   CALCIUM 8.1* 06/27/2014 0455   PROT 7.1 06/26/2014 0500   ALBUMIN 1.7* 06/27/2014 0455   AST 9 06/26/2014 0500   ALT 6 06/26/2014 0500   ALKPHOS 126* 06/26/2014 0500   BILITOT <0.2* 06/26/2014 0500   GFRNONAA >90 06/27/2014 0455   GFRAA >90 06/27/2014 0455   Lipase  No results found for this basename: lipase       Studies/Results: No results found.  Anti-infectives: Anti-infectives   Start     Dose/Rate Route Frequency Ordered Stop   06/22/14 0000  vancomycin (VANCOCIN) 1,250 mg in sodium chloride 0.9 % 250 mL  IVPB     1,250 mg 166.7 mL/hr over 90 Minutes Intravenous Every 12 hours 06/21/14 1706     06/21/14 1700  rifampin (RIFADIN) capsule 300 mg     300 mg Oral Daily 06/21/14 1557     06/19/14 0300  vancomycin (VANCOCIN) IVPB 1000 mg/200 mL premix  Status:  Discontinued     1,000 mg 200 mL/hr over 60 Minutes Intravenous Every 12 hours 06/19/14 0231 06/21/14 1706   06/19/14 0200  vancomycin (VANCOCIN) 500 mg in sodium chloride 0.9 % 100 mL IVPB  Status:  Discontinued     500 mg 100 mL/hr over 60 Minutes Intravenous Every 12 hours 06/18/14 1335 06/19/14 0231   06/18/14 1330  vancomycin (VANCOCIN) 2,000 mg in sodium chloride 0.9 % 500 mL IVPB     2,000 mg 250 mL/hr over 120 Minutes Intravenous  Once 06/18/14 1319 06/18/14 1602   06/17/14 1608  bacitracin 50,000 Units in sodium chloride irrigation 0.9 % 500 mL irrigation  Status:  Discontinued       As needed 06/17/14 1608 06/17/14 1708   06/11/14 0900  fluconazole (DIFLUCAN) IVPB 200 mg     200 mg 100 mL/hr over 60 Minutes Intravenous Every 24 hours 06/11/14 0832  06/17/14 1100   06/10/14 1900  vancomycin (VANCOCIN) 1,250 mg in sodium chloride 0.9 % 250 mL IVPB  Status:  Discontinued     1,250 mg 166.7 mL/hr over 90 Minutes Intravenous Every 8 hours 06/10/14 0927 06/12/14 0954   06/10/14 1700  piperacillin-tazobactam (ZOSYN) IVPB 3.375 g  Status:  Discontinued     3.375 g 12.5 mL/hr over 240 Minutes Intravenous Every 8 hours 06/10/14 0927 06/20/14 1050   06/10/14 1100  vancomycin (VANCOCIN) 1,750 mg in sodium chloride 0.9 % 500 mL IVPB     1,750 mg 250 mL/hr over 120 Minutes Intravenous  Once 06/10/14 0927 06/10/14 1256   06/10/14 1030  piperacillin-tazobactam (ZOSYN) IVPB 3.375 g     3.375 g 100 mL/hr over 30 Minutes Intravenous  Once 06/10/14 0927 06/10/14 1030   06/10/14 0300  piperacillin-tazobactam (ZOSYN) IVPB 3.375 g     3.375 g 12.5 mL/hr over 240 Minutes Intravenous  Once 06/10/14 0249 06/10/14 0444       Assessment/Plan   POD  #15. Laparotomy and repair of perforated pyloric channel ulcer.   Plan: 1. Cont local wound care.  Patient surgically stable.  Will follow every couple of days for wound check while here.  LOS: 17 days    Case Vassell E 06/27/2014, 10:15 AM Pager: [phone]

## 2014-06-27 NOTE — Progress Notes (Signed)
Patient ID: Devin [name]  male  ZOX:[id number]    DOB: 1971/03/13    DOA: 06/10/2014  PCP: No PCP Per Patient                                          CONSULT  PROGRESS NOTE   Brief interim summary 43 yo M who has been undergoing treatment by the neurosurgery team for back issues, underwent lumbar fusion earlier in the summer and had several evacuations of hematomas. He went to the hospital in [place] and was seen to have another fluid collection in his surgical site. He was transferred here to be admitted by them for treatment.  However, when the EDP evaluated him, he was found to be tachycardic and tender on his abdominal exam. CT was performed, and this demonstrated free air and finding consistent with a perforated ulcer. This goes along with his history as he has been taking narcotics, and many NSAIDs. Patient has had surgical repair of his peptic ulcer and also another evacuation of infected hematoma by neurosurgery. For the last 2 weeks, patient's nutritional status has been poor and albumin is 1.6. Patient is fluid overloaded and neurosurgery has consulted IM for assistance in managing his fluid overload.   Assessment/Plan:  Fluid overload, bilateral peripheral edema: secondary to hypoalbuminemia, albumin 1.6, 3rd shifting and fluid resuscitation during multiple surgeries - After starting Lasix and with albumin infusion yesterday, diuresis has improved and -4.16 L out. - Some patients with hypoalbuminemia are relatively resistant to conventional diuretic therapy, his albumin was only 1.6, now improved to 1.7. If correcting with nutrition, it will likely take some time to build up his protein and albumin stores. Will try albumin infusion again today, continue IV Lasix, add metolazone. Follow BMET and creatinine function closely.  - Check UA to rule out any proteinuria, microalbumin/creatinine urine ratio - Nutrition consult done, continue ensure TID and continue prostat  - Doppler  ultrasound of the lower extremities negative for any DVT  Back pain s/p multiple surgeries, wound infection, s/p debridement, infected hematoma  - management per primary team, neurosurgery - Infectious disease following, on vancomycin and rifampin, continue for another 39 days, ID followup in 3-4 weeks post hosp  Perforated gastric ulcer: Status post exploratory laparotomy and repair of perforated pyloric channel ulcer -  Tolerating oral diet, stable from surgical standpoint per Dr Corliss Skains  DM type 2 (diabetes mellitus, type 2): A1C 5.1;  - continue diet controlled   HTN: stable and well controlled.  - Continue current regimen   Anemia; likely anemia of chronic disease, normocytic - Hemoglobin still 7.6 after transfusion of 1 unit packed RBCs  - Follow FOBT   DVT Prophylaxis:  Code Status:  Family Communication:  Disposition: per Primary team     Subjective: Patient seen and examined, still feels legs are tight and painful, does not keep the legs elevated  Objective: Weight change:   Intake/Output Summary (Last 24 hours) at 06/27/14 1014 Last data filed at 06/27/14 0900  Gross per 24 hour  Intake    830 ml  Output   3300 ml  Net  -2470 ml   Blood pressure 138/81, pulse 103, temperature 99 F (37.2 C), temperature source Oral, resp. rate 16, height 5\' 10"  (1.778 m), weight 103.9 kg (229 lb 0.9 oz), SpO2 97.00%.  Physical Exam: General: Alert and awake, oriented x3, NAD CVS:  S1-S2 clear, no murmur rubs or gallops Chest: clear to auscultation bilaterally, no wheezing, rales or rhonchi Abdomen: soft nontender, nondistended, normal bowel sounds  Extremities: no cyanosis, clubbing, 2-3+ edema noted bilaterally   Lab Results: Basic Metabolic Panel:  Recent Labs Lab 06/24/14 1500  06/26/14 0500 06/27/14 0455  NA 136*  < > 140 139  K 3.8  < > 3.7 3.9  CL 100  < > 102 101  CO2 24  < > 26 29  GLUCOSE 94  < > 92 90  BUN 9  < > 10 12  CREATININE 0.82  < > 0.72  0.77  CALCIUM 9.0  < > 8.1* 8.1*  MG 2.0  --   --   --   PHOS  --   --  4.2  --   < > = values in this interval not displayed. Liver Function Tests:  Recent Labs Lab 06/21/14 2118 06/26/14 0500 06/27/14 0455  AST 15 9  --   ALT 5 6  --   ALKPHOS 112 126*  --   BILITOT 0.4 <0.2*  --   PROT 7.0 7.1  --   ALBUMIN 1.6* 1.6* 1.7*   No results found for this basename: LIPASE, AMYLASE,  in the last 168 hours No results found for this basename: AMMONIA,  in the last 168 hours CBC:  Recent Labs Lab 06/26/14 0500 06/26/14 1755 06/27/14 0455  WBC 12.1*  --  10.7*  HGB 7.6* 8.3* 7.6*  HCT 25.0* 26.7* 24.6*  MCV 92.3  --  90.4  PLT 557*  --  497*   Cardiac Enzymes:  Recent Labs Lab 06/21/14 2118  TROPONINI <0.30   BNP: No components found with this basename: POCBNP,  CBG:  Recent Labs Lab 06/22/14 1948 06/23/14 0011 06/23/14 0405 06/23/14 0743 06/23/14 1152  GLUCAP 109* 104* 90 88 108*     Micro Results: Recent Results (from the past 240 hour(s))  WOUND CULTURE     Status: None   Collection Time    06/17/14  4:57 PM      Result Value Ref Range Status   Specimen Description WOUND BACK   Final   Special Requests NONE   Final   Gram Stain     Final   Value: FEW WBC PRESENT,BOTH PMN AND MONONUCLEAR     NO SQUAMOUS EPITHELIAL CELLS SEEN     NO ORGANISMS SEEN     Performed at [facility]   Culture     Final   Value: NO GROWTH 2 DAYS     Performed at [facility]   Report Status 06/20/2014 FINAL   Final  ANAEROBIC CULTURE     Status: None   Collection Time    06/17/14  4:57 PM      Result Value Ref Range Status   Specimen Description WOUND BACK   Final   Special Requests NONE   Final   Gram Stain     Final   Value: FEW WBC PRESENT,BOTH PMN AND MONONUCLEAR     NO SQUAMOUS EPITHELIAL CELLS SEEN     NO ORGANISMS SEEN     Performed at [facility]   Culture     Final   Value: NO ANAEROBES ISOLATED     Performed at [facility]   Report Status 06/22/2014 FINAL   Final    Studies/Results: Ct Lumbar Spine Wo Contrast  06/21/2014   CLINICAL DATA:  Debridement of lumbar spine  EXAM: CT LUMBAR SPINE WITHOUT CONTRAST  TECHNIQUE: Multidetector CT imaging of the lumbar spine was performed without intravenous contrast administration. Multiplanar CT image reconstructions were also generated.  COMPARISON:  None.  FINDINGS: The alignment is anatomic. There is no acute fracture or static listhesis. There is no spondylolysis.  There is posterior spinal fusion from T10 through L5. There are vertical stabilizing rods which are in satisfactory position. There is a chronic burst fracture of the T12 vertebral body. There are laminectomies from L2 through L5. There is interbody disc material from L2 through S1 without osseous incorporation. There has been interval removal of the bilateral sacral screws. There is air present within the surgical bed of the posterior elements from L2 through L5.  There are degenerative changes of bilateral SI joints.  IMPRESSION: 1. Interval removal of bilateral S1 hardware. 2. Extensive posterior spinal fusion from T10 through L5 with postsurgical changes in the posterior lumbar soft tissues.   Electronically Signed   By: Elige Ko   On: 06/21/2014 20:23   Mr Lumbar Spine W Wo Contrast  05/26/2014   CLINICAL DATA:  Thoracolumbar fusion with hardware 04/21/2014. Repeat operation for hematoma evacuation 05/11/2014. Now with increased back pain .  EXAM: MRI LUMBAR SPINE WITHOUT AND WITH CONTRAST  TECHNIQUE: Multiplanar and multiecho pulse sequences of the lumbar spine were obtained without and with intravenous contrast.  CONTRAST:  20mL MULTIHANCE GADOBENATE DIMEGLUMINE 529 MG/ML IV SOLN  COMPARISON:  Lumbar MRI 05/15/2014  FINDINGS: Extensive lumbar hardware is causing extensive artifact with limited anatomic detail as noted on prior studies.  Chronic compression fractures T12 and L1 are unchanged. Pedicle  screw fusion with hardware from T11 through S1. Decompressive laminectomy T12 through L1.  Complex fluid collection is present posterior to the dura extending from L2 through L5. This is consistent with hematoma. This is smaller than that seen previously. The hematoma does indent the thecal sac and causes spinal stenosis most prominent at L3-4 and L4-5 however stenosis is not as severe as the prior MRI. Postcontrast imaging limited by a hardware. The fluid collection measures approximately 3 x 3 x 8 cm.  Subcutaneous fluid collection superficial to the fascia measures 2.5 x 8.3 cm on axial images and extends over 20 cm from T12 through the sacrum. This was present previously but appears improved. This could represent a chronic seroma or of CSF leak. The fluid does track down to the hardware bilaterally at the L1 level. This is simple fluid and could represent CSF.  It is possible the 2 fluid collections are connected however they could be separate. Infection cannot be excluded.  IMPRESSION: Complex fluid collection extending down to the dura from L2 through L4 consistent with hematoma. This is causing moderate spinal stenosis. This collection is smaller than that seen on the prior MRI.  Subcutaneous fluid collection from T12 through the sacrum is also smaller and could represent a CSF collection. This fluid does track around the hardware at L1 suggestive of CSF leak.  Infected fluid not excluded.   Electronically Signed   By: Marlan Palau M.D.   On: 05/26/2014 19:51   Ct Abdomen Pelvis W Contrast  06/10/2014   CLINICAL DATA:  Severe left lower quadrant pain.  EXAM: CT ABDOMEN AND PELVIS WITH CONTRAST  TECHNIQUE: Multidetector CT imaging of the abdomen and pelvis was performed using the standard protocol following bolus administration of intravenous contrast.  CONTRAST:  OMNIPAQUE IOHEXOL 300 MG/ML  SOLN  COMPARISON:  MRI of the  lumbar spine May 26, 2014  FINDINGS: LUNG BASES: Included view of the lung  bases demonstrate small layering left pleural effusion. Visualized heart and pericardium are unremarkable.  SOLID ORGANS: Layering density in the gallbladder suggests sludge. Liver, spleen, pancreas and adrenal glands are nonsuspicious .  GASTROINTESTINAL TRACT: Distal stomach/ pyloric wall thickening with superimposed perforated fusion posteriorly, with free air. The small and large bowel are normal in course and caliber without inflammatory changes. Colonic diverticulosis. Normal appendix.  KIDNEYS/ URINARY TRACT: Kidneys are orthotopic, demonstrating symmetric enhancement. No nephrolithiasis, hydronephrosis or solid renal masses. The unopacified ureters are normal in course and caliber. Delayed imaging through the kidneys demonstrates symmetric prompt contrast excretion within the proximal urinary collecting system. Urinary bladder is partially distended and unremarkable.  PERITONEUM/RETROPERITONEUM: Large amount of pneumoperitoneum anterior within the upper abdomen. Small a moderate amount of layering ascites without abscess. Aortoiliac vessels are normal in course and caliber, mild calcific atherosclerosis. No lymphadenopathy by CT size criteria. Internal reproductive organs are unremarkable.  SOFT TISSUE/OSSEOUS STRUCTURES: Status post T10 through S1 posterior instrumentation, at T12 chronic appearing burst fracture. L2 through L5 laminectomies with L2 through S1 interbody disc material, which does not appear incorporated. In addition, lucency about the S1 screws concerning for hardware failure possible infection. Peripherally calcified chronic appearing small lumbar paraspinal fluid collection without superimposed inflammatory changes.  IMPRESSION: Perforated gastric pyloric ulcer with moderate amount of pneumoperitoneum and small to moderate amount of ascites without abscess.  Colonic diverticulosis without convincing evidence of acute diverticulitis.  Extensive spinal surgery, with S1 screws periprosthetic  lucency consistent with hardware failure.  Findings discussed with and reconfirmed by Dr.KEVIN CAMPOS on9/11/2015at2:45 am.   Electronically Signed   By: Awilda Metro   On: 06/10/2014 02:56   Dg Chest Port 1 View  06/12/2014   CLINICAL DATA:  Evaluate atelectasis.  EXAM: PORTABLE CHEST - 1 VIEW  COMPARISON:  Chest x-ray 06/11/2014.  FINDINGS: A nasogastric tube is seen extending into the stomach, however, the tip of the nasogastric tube extends below the lower margin of the image. Lung volumes are low. No consolidative airspace disease. No pleural effusions. Improved aeration at the left base, compatible with resolution of atelectasis noted on the prior examination. No evidence of pulmonary edema. Heart size is normal. Upper mediastinal contours are within normal limits. Multiple old healed right-sided rib fractures are incidentally noted.  IMPRESSION: 1. Support apparatus, as above. 2. Low lung volumes with improving bibasilar aeration, particularly in the left.   Electronically Signed   By: Trudie Reed M.D.   On: 06/12/2014 08:37   Dg Chest Port 1 View  06/11/2014   CLINICAL DATA:  Respiratory failure  EXAM: PORTABLE CHEST - 1 VIEW  COMPARISON:  05/31/2014  FINDINGS: Very limited inspiratory effect. Endotracheal tube and NG tube unchanged. Right lung is clear. Retrocardiac left lower lobe opacity is increased when compared to prior study.  IMPRESSION: Increased retrocardiac left lower lobe opacification consistent with lower lobe consolidation.   Electronically Signed   By: Esperanza Heir M.D.   On: 06/11/2014 08:24   Dg Chest Port 1 View  06/10/2014   CLINICAL DATA:  Check endotracheal tube placement  EXAM: PORTABLE CHEST - 1 VIEW  COMPARISON:  04/19/2014  FINDINGS: Cardiac shadow is mildly prominent. A nasogastric catheter is seen within the stomach. An endotracheal tube is noted with the tip 3.4 cm above the carina. The lungs are clear. No acute bony abnormality is noted. Postsurgical  changes in the thoracic and  lumbar spine are seen.  IMPRESSION: Endotracheal tube and nasogastric catheter in satisfactory position. No acute abnormality noted.  These results will be called to the ordering clinician or representative by the Radiologist Assistant, and communication documented in the PACS or zVision Dashboard.   Electronically Signed   By: Alcide Clever M.D.   On: 06/10/2014 09:38   Dg Kayleen Memos W/water Sol Cm  06/14/2014   CLINICAL DATA:  Gastric perforation status post repair  EXAM: WATER SOLUBLE UPPER GI SERIES  TECHNIQUE: Single-column upper GI series was performed using water soluble contrast.  CONTRAST:  Water-soluble contrast.  150 cc Omnipaque  COMPARISON:  CT 06/10/2014  FLUOROSCOPY TIME:  1 min 7 seconds  FINDINGS: Water-soluble contrast was hand injected through the nasogastric tube. Contrast fills the gastric fundus and body without evidence of extraluminal leakage of the contrast material. Contrast flows into the proximal small bowel.  IMPRESSION: No evidence of leakage of the contrast with  filling of the stomach.   Electronically Signed   By: Genevive Bi M.D.   On: 06/14/2014 15:35    Medications: Scheduled Meds: . sodium chloride   Intravenous Once  . sodium chloride   Intravenous Once  . sodium chloride   Intravenous Once  . albumin human  25 g Intravenous Daily  . antiseptic oral rinse  7 mL Mouth Rinse QID  . dextrose  50 mL Intravenous Once  . feeding supplement (PRO-STAT SUGAR FREE 64)  30 mL Oral BID WC  . furosemide  40 mg Intravenous 3 times per day  . glucagon (human recombinant)  1 mg Intravenous Once  . metolazone  5 mg Oral Daily  . multivitamin with minerals  1 tablet Oral Daily  . pantoprazole  40 mg Oral BID  . potassium chloride  40 mEq Oral Daily  . rifampin  300 mg Oral Daily  . sodium chloride  3 mL Intravenous Q12H  . vancomycin  1,250 mg Intravenous Q12H      LOS: 17 days   RAI,RIPUDEEP M.D. [facility] 06/27/2014, 10:14  AM Pager: [phone]  If 7PM-7AM, please contact night-coverage [email or web address] Password [email or web address]

## 2014-06-27 NOTE — Progress Notes (Signed)
PT Cancellation Note  Patient Details Name: Devin [name] MRN: [id number] DOB: Aug 29, 1971   Cancelled Treatment:    Reason Eval/Treat Not Completed: Pain limiting ability to participate; Pt very politely refuses stating his legs are just to swollen and painful to do anything; dopplers negative for DVT, will see again as schedule permits;   [facility] 06/27/2014, 12:02 PM

## 2014-06-28 LAB — CBC
HCT: 26.3 % — ABNORMAL LOW (ref 39.0–52.0)
HEMOGLOBIN: 8.3 g/dL — AB (ref 13.0–17.0)
MCH: 28.1 pg (ref 26.0–34.0)
MCHC: 31.6 g/dL (ref 30.0–36.0)
MCV: 89.2 fL (ref 78.0–100.0)
Platelets: 520 10*3/uL — ABNORMAL HIGH (ref 150–400)
RBC: 2.95 MIL/uL — AB (ref 4.22–5.81)
RDW: 19.2 % — ABNORMAL HIGH (ref 11.5–15.5)
WBC: 9.4 10*3/uL (ref 4.0–10.5)

## 2014-06-28 LAB — BASIC METABOLIC PANEL
ANION GAP: 12 (ref 5–15)
BUN: 11 mg/dL (ref 6–23)
CHLORIDE: 93 meq/L — AB (ref 96–112)
CO2: 32 mEq/L (ref 19–32)
Calcium: 8.4 mg/dL (ref 8.4–10.5)
Creatinine, Ser: 0.82 mg/dL (ref 0.50–1.35)
GFR calc non Af Amer: >90 mL/min (ref >90)
Glucose, Bld: 92 mg/dL (ref 70–99)
POTASSIUM: 3.5 meq/L — AB (ref 3.7–5.3)
Sodium: 137 mEq/L (ref 137–147)

## 2014-06-28 MED ORDER — ALBUMIN HUMAN 25 % IV SOLN
25.0000 g | Freq: Every day | INTRAVENOUS | Status: AC
  Administered 2014-06-28 – 2014-06-29 (×2): 25 g via INTRAVENOUS
  Filled 2014-06-28 (×2): qty 100

## 2014-06-28 MED ORDER — POTASSIUM CHLORIDE CRYS ER 20 MEQ PO TBCR
40.0000 meq | EXTENDED_RELEASE_TABLET | Freq: Once | ORAL | Status: AC
  Administered 2014-06-28: 40 meq via ORAL
  Filled 2014-06-28: qty 2

## 2014-06-28 NOTE — Progress Notes (Signed)
Physical Therapy Treatment Patient Details Name: Devin [name] MRN: [id number] DOB: 01/09/1971 Today's Date: 06/28/2014    History of Present Illness      PT Comments    Pt prefers to go by "Devin [name]".   Pt OOB in recliner but back brace was on bench.  Assisted with proper application and instructed/educated pt.  Assisted with amb in hallway a great distance.  <255 VC's for safety with turns and upright posture.   Follow Up Recommendations  Home health PT;Supervision/Assistance - 24 hour     Equipment Recommendations  None recommended by PT    Recommendations for Other Services       Precautions / Restrictions Precautions Precautions: Back Precaution Comments: pt able to independently recall all precautions with "BAT"; did require min cues during session to apply precautions Other Brace/Splint: new TLSO delievered and fitted by [employer]; pt tolerating well but still required assist to properly apply Restrictions Weight Bearing Restrictions: No    Mobility  Bed Mobility               General bed mobility comments: Pt OOB in recliner  Transfers Overall transfer level: Needs assistance Equipment used: Rolling walker (2 wheeled) Transfers: Sit to/from Stand Sit to Stand: Supervision         General transfer comment: min cues for back precautions and hand placement  Ambulation/Gait Ambulation/Gait assistance: Supervision;Min guard Ambulation Distance (Feet): 400 Feet Assistive device: Rolling walker (2 wheeled) Gait Pattern/deviations: Step-to pattern;Trunk flexed;Wide base of support Gait velocity: cues for safe gt speed   General Gait Details: no LOB noted with mobility; min cues for safety and upright posture   Stairs            Wheelchair Mobility    Modified Rankin (Stroke Patients Only)       Balance                                    Cognition                            Exercises      General Comments         Pertinent Vitals/Pain      Home Living                      Prior Function            PT Goals (current goals can now be found in the care plan section) Progress towards PT goals: Progressing toward goals    Frequency  Min 4X/week    PT Plan      Co-evaluation             End of Session Equipment Utilized During Treatment: Back brace Activity Tolerance: Patient tolerated treatment well Patient left: in chair;with call bell/phone within reach     Time: 1130-1143 PT Time Calculation (min): 13 min  Charges:  $Gait Training: 8-22 mins                    G Codes:      Felecia ShellingLori Pranish Akhavan  PTA [facility]  Acute  Rehab Pager      [phone]

## 2014-06-28 NOTE — Progress Notes (Signed)
Patient ID: Devin DomeJerry [name], male   DOB: 09/14/1971, 43 y.o.   MRN: [id number] Neurologically remained stable. Incision on back is clean and dry Leg swelling is decreasing slowly SNF when stable

## 2014-06-28 NOTE — Clinical Social Work Note (Addendum)
4:20  CSW [facility] with [place] [facility].  Marylene Landngela will look over patient information and call back 9/30 am  1:00pm  [facility]- no beds available currently  10:30am  CSW offered choice of SNF beds.  Patient requested that CSW look into [facility], left messages with admissions coordinators at these facilities.  Patient currently states that he feels like going home but if he were able to go to one of those two SNFs he would.  Patient wants to speak with his doctor about his discharge disposition and figure out if he believes he is capable of self-care at home.    Patient also inquired whether or not he could go to SNF from home if he realizes he is unable to take care of himself.  CSW explained that the patient could go to SNF from home but that process could take over a week to complete and might not be covered by insurance.  CSW will continue to follow.  Merlyn LotJenna Holoman, LCSWA Clinical Social Worker [phone]

## 2014-06-28 NOTE — Progress Notes (Signed)
Patient ID: Devin [name]  male  ZOX:[id number]    DOB: 1970-11-14    DOA: 06/10/2014  PCP: No PCP Per Patient                                          CONSULT  PROGRESS NOTE   Brief interim summary 43 yo M who has been undergoing treatment by the neurosurgery team for back issues, underwent lumbar fusion earlier in the summer and had several evacuations of hematomas. He went to the hospital in [place] and was seen to have another fluid collection in his surgical site. He was transferred here to be admitted by them for treatment.  However, when the EDP evaluated him, he was found to be tachycardic and tender on his abdominal exam. CT was performed, and this demonstrated free air and finding consistent with a perforated ulcer. This goes along with his history as he has been taking narcotics, and many NSAIDs. Patient has had surgical repair of his peptic ulcer and also another evacuation of infected hematoma by neurosurgery. For the last 2 weeks, patient's nutritional status has been poor and albumin is 1.6. Patient is fluid overloaded and neurosurgery has consulted IM for assistance in managing his fluid overload.   Assessment/Plan:  Fluid overload, bilateral peripheral edema: secondary to hypoalbuminemia, albumin 1.6, 3rd shifting and fluid resuscitation during multiple surgeries- improving now - After starting Lasix and with albumin infusions, diuresis has significantly improved and -7.56 L out/negative balance. Metolazone was added 9/28. - Some patients with hypoalbuminemia are relatively resistant to conventional diuretic therapy, his albumin was only 1.6, now improved to 1.7. If correcting with nutrition, it will likely take some time to build up his protein and albumin stores. - Follow BMET and creatinine function closely, check albumin level in a.m. - UA negative for proteinuria - Nutrition consult done, continue ensure TID and continue prostat  - Doppler ultrasound of the lower  extremities negative for any DVT - I have ordered albumin infusion for today and for tomorrow 9/30, should be able to transition to oral Lasix at DC in 1-2 days.   Back pain s/p multiple surgeries, wound infection, s/p debridement, infected hematoma  - management per primary team, neurosurgery - Infectious disease following, on vancomycin and rifampin, continue for another 38 days, ID followup in 3-4 weeks post hosp  Perforated gastric ulcer: Status post exploratory laparotomy and repair of perforated pyloric channel ulcer -  Tolerating oral diet, stable from surgical standpoint per Dr Corliss Skains  DM type 2 (diabetes mellitus, type 2): A1C 5.1;  - continue diet controlled   HTN: stable and well controlled.  - Continue current regimen   Anemia; likely anemia of chronic disease, normocytic - Hemoglobin 8.3 after transfusion of 1 unit packed RBCs  - Follow FOBT   DVT Prophylaxis:  Code Status:  Family Communication:  Disposition: per Primary team, should be able to dc to SNF on Thursday or Friday from medical standpoint      Subjective: Patient seen and examined, pain in legs but swelling improving, does not keep the legs elevated despite my explaining   Objective: Weight change:   Intake/Output Summary (Last 24 hours) at 06/28/14 0952 Last data filed at 06/28/14 0900  Gross per 24 hour  Intake   1520 ml  Output   4925 ml  Net  -3405 ml   Blood pressure 131/81, pulse  95, temperature 98.5 F (36.9 C), temperature source Oral, resp. rate 18, height 5\' 10"  (1.778 m), weight 103.9 kg (229 lb 0.9 oz), SpO2 96.00%.  Physical Exam: General: Alert and awake, oriented x3, NAD CVS: S1-S2 clear, no murmur rubs or gallops Chest: clear to auscultation bilaterally Abdomen: soft nontender, nondistended, normal bowel sounds  Extremities: no cyanosis, clubbing, edema bilaterally improving   Lab Results: Basic Metabolic Panel:  Recent Labs Lab 06/24/14 1500  06/26/14 0500  06/27/14 0455 06/28/14 0610  NA 136*  < > 140 139 137  K 3.8  < > 3.7 3.9 3.5*  CL 100  < > 102 101 93*  CO2 24  < > 26 29 32  GLUCOSE 94  < > 92 90 92  BUN 9  < > 10 12 11   CREATININE 0.82  < > 0.72 0.77 0.82  CALCIUM 9.0  < > 8.1* 8.1* 8.4  MG 2.0  --   --   --   --   PHOS  --   --  4.2  --   --   < > = values in this interval not displayed. Liver Function Tests:  Recent Labs Lab 06/21/14 2118 06/26/14 0500 06/27/14 0455  AST 15 9  --   ALT 5 6  --   ALKPHOS 112 126*  --   BILITOT 0.4 <0.2*  --   PROT 7.0 7.1  --   ALBUMIN 1.6* 1.6* 1.7*   No results found for this basename: LIPASE, AMYLASE,  in the last 168 hours No results found for this basename: AMMONIA,  in the last 168 hours CBC:  Recent Labs Lab 06/27/14 0455 06/28/14 0610  WBC 10.7* 9.4  HGB 7.6* 8.3*  HCT 24.6* 26.3*  MCV 90.4 89.2  PLT 497* 520*   Cardiac Enzymes:  Recent Labs Lab 06/21/14 2118  TROPONINI <0.30   BNP: No components found with this basename: POCBNP,  CBG:  Recent Labs Lab 06/22/14 1948 06/23/14 0011 06/23/14 0405 06/23/14 0743 06/23/14 1152  GLUCAP 109* 104* 90 88 108*     Micro Results: No results found for this or any previous visit (from the past 240 hour(s)).  Studies/Results: Ct Lumbar Spine Wo Contrast  06/21/2014   CLINICAL DATA:  Debridement of lumbar spine  EXAM: CT LUMBAR SPINE WITHOUT CONTRAST  TECHNIQUE: Multidetector CT imaging of the lumbar spine was performed without intravenous contrast administration. Multiplanar CT image reconstructions were also generated.  COMPARISON:  None.  FINDINGS: The alignment is anatomic. There is no acute fracture or static listhesis. There is no spondylolysis.  There is posterior spinal fusion from T10 through L5. There are vertical stabilizing rods which are in satisfactory position. There is a chronic burst fracture of the T12 vertebral body. There are laminectomies from L2 through L5. There is interbody disc material from  L2 through S1 without osseous incorporation. There has been interval removal of the bilateral sacral screws. There is air present within the surgical bed of the posterior elements from L2 through L5.  There are degenerative changes of bilateral SI joints.  IMPRESSION: 1. Interval removal of bilateral S1 hardware. 2. Extensive posterior spinal fusion from T10 through L5 with postsurgical changes in the posterior lumbar soft tissues.   Electronically Signed   By: Elige Ko   On: 06/21/2014 20:23   Mr Lumbar Spine W Wo Contrast  05/26/2014   CLINICAL DATA:  Thoracolumbar fusion with hardware 04/21/2014. Repeat operation for hematoma evacuation 05/11/2014. Now with increased  back pain .  EXAM: MRI LUMBAR SPINE WITHOUT AND WITH CONTRAST  TECHNIQUE: Multiplanar and multiecho pulse sequences of the lumbar spine were obtained without and with intravenous contrast.  CONTRAST:  20mL MULTIHANCE GADOBENATE DIMEGLUMINE 529 MG/ML IV SOLN  COMPARISON:  Lumbar MRI 05/15/2014  FINDINGS: Extensive lumbar hardware is causing extensive artifact with limited anatomic detail as noted on prior studies.  Chronic compression fractures T12 and L1 are unchanged. Pedicle screw fusion with hardware from T11 through S1. Decompressive laminectomy T12 through L1.  Complex fluid collection is present posterior to the dura extending from L2 through L5. This is consistent with hematoma. This is smaller than that seen previously. The hematoma does indent the thecal sac and causes spinal stenosis most prominent at L3-4 and L4-5 however stenosis is not as severe as the prior MRI. Postcontrast imaging limited by a hardware. The fluid collection measures approximately 3 x 3 x 8 cm.  Subcutaneous fluid collection superficial to the fascia measures 2.5 x 8.3 cm on axial images and extends over 20 cm from T12 through the sacrum. This was present previously but appears improved. This could represent a chronic seroma or of CSF leak. The fluid does track  down to the hardware bilaterally at the L1 level. This is simple fluid and could represent CSF.  It is possible the 2 fluid collections are connected however they could be separate. Infection cannot be excluded.  IMPRESSION: Complex fluid collection extending down to the dura from L2 through L4 consistent with hematoma. This is causing moderate spinal stenosis. This collection is smaller than that seen on the prior MRI.  Subcutaneous fluid collection from T12 through the sacrum is also smaller and could represent a CSF collection. This fluid does track around the hardware at L1 suggestive of CSF leak.  Infected fluid not excluded.   Electronically Signed   By: Marlan Palau M.D.   On: 05/26/2014 19:51   Ct Abdomen Pelvis W Contrast  06/10/2014   CLINICAL DATA:  Severe left lower quadrant pain.  EXAM: CT ABDOMEN AND PELVIS WITH CONTRAST  TECHNIQUE: Multidetector CT imaging of the abdomen and pelvis was performed using the standard protocol following bolus administration of intravenous contrast.  CONTRAST:  OMNIPAQUE IOHEXOL 300 MG/ML  SOLN  COMPARISON:  MRI of the lumbar spine May 26, 2014  FINDINGS: LUNG BASES: Included view of the lung bases demonstrate small layering left pleural effusion. Visualized heart and pericardium are unremarkable.  SOLID ORGANS: Layering density in the gallbladder suggests sludge. Liver, spleen, pancreas and adrenal glands are nonsuspicious .  GASTROINTESTINAL TRACT: Distal stomach/ pyloric wall thickening with superimposed perforated fusion posteriorly, with free air. The small and large bowel are normal in course and caliber without inflammatory changes. Colonic diverticulosis. Normal appendix.  KIDNEYS/ URINARY TRACT: Kidneys are orthotopic, demonstrating symmetric enhancement. No nephrolithiasis, hydronephrosis or solid renal masses. The unopacified ureters are normal in course and caliber. Delayed imaging through the kidneys demonstrates symmetric prompt contrast  excretion within the proximal urinary collecting system. Urinary bladder is partially distended and unremarkable.  PERITONEUM/RETROPERITONEUM: Large amount of pneumoperitoneum anterior within the upper abdomen. Small a moderate amount of layering ascites without abscess. Aortoiliac vessels are normal in course and caliber, mild calcific atherosclerosis. No lymphadenopathy by CT size criteria. Internal reproductive organs are unremarkable.  SOFT TISSUE/OSSEOUS STRUCTURES: Status post T10 through S1 posterior instrumentation, at T12 chronic appearing burst fracture. L2 through L5 laminectomies with L2 through S1 interbody disc material, which does not appear incorporated. In  addition, lucency about the S1 screws concerning for hardware failure possible infection. Peripherally calcified chronic appearing small lumbar paraspinal fluid collection without superimposed inflammatory changes.  IMPRESSION: Perforated gastric pyloric ulcer with moderate amount of pneumoperitoneum and small to moderate amount of ascites without abscess.  Colonic diverticulosis without convincing evidence of acute diverticulitis.  Extensive spinal surgery, with S1 screws periprosthetic lucency consistent with hardware failure.  Findings discussed with and reconfirmed by Dr.KEVIN CAMPOS on9/11/2015at2:45 am.   Electronically Signed   By: Awilda Metroourtnay  Bloomer   On: 06/10/2014 02:56   Dg Chest Port 1 View  06/12/2014   CLINICAL DATA:  Evaluate atelectasis.  EXAM: PORTABLE CHEST - 1 VIEW  COMPARISON:  Chest x-ray 06/11/2014.  FINDINGS: A nasogastric tube is seen extending into the stomach, however, the tip of the nasogastric tube extends below the lower margin of the image. Lung volumes are low. No consolidative airspace disease. No pleural effusions. Improved aeration at the left base, compatible with resolution of atelectasis noted on the prior examination. No evidence of pulmonary edema. Heart size is normal. Upper mediastinal contours are within  normal limits. Multiple old healed right-sided rib fractures are incidentally noted.  IMPRESSION: 1. Support apparatus, as above. 2. Low lung volumes with improving bibasilar aeration, particularly in the left.   Electronically Signed   By: Trudie Reedaniel  Entrikin M.D.   On: 06/12/2014 08:37   Dg Chest Port 1 View  06/11/2014   CLINICAL DATA:  Respiratory failure  EXAM: PORTABLE CHEST - 1 VIEW  COMPARISON:  05/31/2014  FINDINGS: Very limited inspiratory effect. Endotracheal tube and NG tube unchanged. Right lung is clear. Retrocardiac left lower lobe opacity is increased when compared to prior study.  IMPRESSION: Increased retrocardiac left lower lobe opacification consistent with lower lobe consolidation.   Electronically Signed   By: Esperanza Heiraymond  Rubner M.D.   On: 06/11/2014 08:24   Dg Chest Port 1 View  06/10/2014   CLINICAL DATA:  Check endotracheal tube placement  EXAM: PORTABLE CHEST - 1 VIEW  COMPARISON:  04/19/2014  FINDINGS: Cardiac shadow is mildly prominent. A nasogastric catheter is seen within the stomach. An endotracheal tube is noted with the tip 3.4 cm above the carina. The lungs are clear. No acute bony abnormality is noted. Postsurgical changes in the thoracic and lumbar spine are seen.  IMPRESSION: Endotracheal tube and nasogastric catheter in satisfactory position. No acute abnormality noted.  These results will be called to the ordering clinician or representative by the Radiologist Assistant, and communication documented in the PACS or zVision Dashboard.   Electronically Signed   By: Alcide CleverMark  Lukens M.D.   On: 06/10/2014 09:38   Dg Kayleen MemosUgi W/water Sol Cm  06/14/2014   CLINICAL DATA:  Gastric perforation status post repair  EXAM: WATER SOLUBLE UPPER GI SERIES  TECHNIQUE: Single-column upper GI series was performed using water soluble contrast.  CONTRAST:  Water-soluble contrast.  150 cc Omnipaque  COMPARISON:  CT 06/10/2014  FLUOROSCOPY TIME:  1 min 7 seconds  FINDINGS: Water-soluble contrast was hand  injected through the nasogastric tube. Contrast fills the gastric fundus and body without evidence of extraluminal leakage of the contrast material. Contrast flows into the proximal small bowel.  IMPRESSION: No evidence of leakage of the contrast with  filling of the stomach.   Electronically Signed   By: Genevive BiStewart  Edmunds M.D.   On: 06/14/2014 15:35    Medications: Scheduled Meds: . sodium chloride   Intravenous Once  . sodium chloride   Intravenous Once  .  sodium chloride   Intravenous Once  . albumin human  25 g Intravenous Daily  . antiseptic oral rinse  7 mL Mouth Rinse QID  . dextrose  50 mL Intravenous Once  . feeding supplement (PRO-STAT SUGAR FREE 64)  30 mL Oral BID WC  . furosemide  40 mg Intravenous 3 times per day  . glucagon (human recombinant)  1 mg Intravenous Once  . metolazone  5 mg Oral Daily  . multivitamin with minerals  1 tablet Oral Daily  . pantoprazole  40 mg Oral BID  . potassium chloride  40 mEq Oral Daily  . rifampin  300 mg Oral Q12H  . sodium chloride  3 mL Intravenous Q12H  . vancomycin  1,000 mg Intravenous Q12H      LOS: 18 days   RAI,RIPUDEEP M.D. [facility] 06/28/2014, 9:52 AM Pager: [phone]  If 7PM-7AM, please contact night-coverage [email or web address] Password [email or web address]

## 2014-06-28 NOTE — Progress Notes (Signed)
Occupational Therapy Treatment Patient Details Name: Devin DomeJerry Kauer MRN: [id number] DOB: 06/23/1971 Today's Date: 06/28/2014    History of present illness Pt is a 43 yo M who has been undergoing treatment by the neurosurgery team for back issues. He underwent lumbar fusion earlier in the summer and had several evacuations of hematomas. He went to the hospital in [place] and was seen to have another fluid collection in his surgical site.  Since admission pt has undergone [name], repair of perforated pyloric channel ulcer on 06/10/14.  And most recently s/p Debridement of wound, removal of S1 pedicular screws.     OT comments  Education provided during session. Planning to possibly d/c to SNF.   Follow Up Recommendations  SNF    Equipment Recommendations  Other (comment) (defer to next venue)    Recommendations for Other Services      Precautions / Restrictions Precautions Precautions: Back Precaution Comments: cues to maintain precautions Required Braces or Orthoses: Spinal Brace Spinal Brace: Thoracolumbosacral orthotic;Applied in sitting position Restrictions Weight Bearing Restrictions: No       Mobility Bed Mobility Overal bed mobility: Needs Assistance Bed Mobility: Rolling;Sidelying to Sit;Sit to Sidelying;Supine to Sit Rolling: Supervision Sidelying to sit: Supervision Supine to sit: Supervision (HOB up and pt moved to edge of bed)   Sit to sidelying: Supervision General bed mobility comments: cues for log roll technique.  Transfers Overall transfer level: Needs assistance Equipment used: Rolling walker (2 wheeled) Transfers: Sit to/from Stand Sit to Stand: Supervision              Balance                                   ADL Overall ADL's : Needs assistance/impaired     Grooming: Oral care;Standing;Min guard;Wash/dry face           Upper Body Dressing : Sitting;Moderate assistance   Lower Body Dressing: Set  up;Supervision/safety;Sitting/lateral leans (socks)   Toilet Transfer: Min guard;Ambulation;RW (bed)           Functional mobility during ADLs: Min guard;Rolling walker General ADL Comments: Pt ambulated to bathroom and performed grooming tasks. OT assisted in donning back brace. Educated on use of cup for teeth care and placement of grooming items. Educated on tub transfer techniques and options to use for shower chair. Educated on back brace.  Urinated sitting EOB.      Vision                     Perception     Praxis      Cognition   Behavior During Therapy: WFL for tasks assessed/performed Overall Cognitive Status: Within Functional Limits for tasks assessed                       Extremity/Trunk Assessment               Exercises     Shoulder Instructions       General Comments      Pertinent Vitals/ Pain       Pain Assessment: 0-10 Pain Score: 9  Pain Location: legs Pain Intervention(s): Monitored during session;Repositioned  Home Living  Prior Functioning/Environment              Frequency Min 2X/week     Progress Toward Goals  OT Goals(current goals can now be found in the care plan section)  Progress towards OT goals: Progressing toward goals  Acute Rehab OT Goals Patient Stated Goal: go outside OT Goal Formulation: With patient Time For Goal Achievement: 06/25/14 Potential to Achieve Goals: Good ADL Goals Pt Will Perform Grooming: with modified independence;standing Pt Will Perform Lower Body Dressing: with modified independence;with adaptive equipment;sit to/from stand Pt Will Transfer to Toilet: with modified independence;ambulating;regular height toilet Pt Will Perform Toileting - Clothing Manipulation and hygiene: with modified independence;with adaptive equipment;sit to/from stand Pt Will Perform Tub/Shower Transfer: Tub transfer;with  supervision;ambulating;rolling walker;shower seat Additional ADL Goal #1: Pt will demonstrate improved activity tolerance by completing a full ADL task such as UB/LB bathing, with 1-2 rest breaks as needed. Additional ADL Goal #2: Pt will be able to retrieve ADL items at mod I level, using AE as needed and compensatory strategies.  Plan Discharge plan needs to be updated    Co-evaluation                 End of Session Equipment Utilized During Treatment: Gait belt;Rolling walker;Back brace   Activity Tolerance Patient tolerated treatment well   Patient Left in bed;with call bell/phone within reach   Nurse Communication Other (comment) (unhooked IV)        Time: 1610-9604 (few minutes subtracted due to IV difficulty)  OT Time Calculation (min): 22 min  Charges: OT General Charges $OT Visit: 1 Procedure OT Treatments $Self Care/Home Management : 8-22 mins  Earlie Raveling OTR/L [phone] 06/28/2014, 6:07 PM

## 2014-06-29 LAB — CBC
HEMATOCRIT: 27.1 % — AB (ref 39.0–52.0)
Hemoglobin: 8.4 g/dL — ABNORMAL LOW (ref 13.0–17.0)
MCH: 28.2 pg (ref 26.0–34.0)
MCHC: 31 g/dL (ref 30.0–36.0)
MCV: 90.9 fL (ref 78.0–100.0)
PLATELETS: 533 10*3/uL — AB (ref 150–400)
RBC: 2.98 MIL/uL — AB (ref 4.22–5.81)
RDW: 19.2 % — ABNORMAL HIGH (ref 11.5–15.5)
WBC: 11.7 10*3/uL — AB (ref 4.0–10.5)

## 2014-06-29 LAB — BASIC METABOLIC PANEL
Anion gap: 13 (ref 5–15)
BUN: 13 mg/dL (ref 6–23)
CO2: 32 meq/L (ref 19–32)
Calcium: 8.5 mg/dL (ref 8.4–10.5)
Chloride: 88 mEq/L — ABNORMAL LOW (ref 96–112)
Creatinine, Ser: 0.86 mg/dL (ref 0.50–1.35)
GFR calc Af Amer: >90 mL/min (ref >90)
GFR calc non Af Amer: >90 mL/min (ref >90)
Glucose, Bld: 104 mg/dL — ABNORMAL HIGH (ref 70–99)
Potassium: 3.2 mEq/L — ABNORMAL LOW (ref 3.7–5.3)
Sodium: 133 mEq/L — ABNORMAL LOW (ref 137–147)

## 2014-06-29 LAB — VANCOMYCIN, TROUGH: Vancomycin Tr: 19.9 ug/mL (ref 10.0–20.0)

## 2014-06-29 LAB — ALBUMIN: Albumin: 2.3 g/dL — ABNORMAL LOW (ref 3.5–5.2)

## 2014-06-29 MED ORDER — FUROSEMIDE 10 MG/ML IJ SOLN
40.0000 mg | Freq: Two times a day (BID) | INTRAMUSCULAR | Status: DC
  Administered 2014-06-29: 40 mg via INTRAVENOUS
  Filled 2014-06-29 (×3): qty 4

## 2014-06-29 MED ORDER — POTASSIUM CHLORIDE CRYS ER 20 MEQ PO TBCR
40.0000 meq | EXTENDED_RELEASE_TABLET | Freq: Three times a day (TID) | ORAL | Status: DC
  Administered 2014-06-29 (×2): 40 meq via ORAL
  Filled 2014-06-29 (×5): qty 2

## 2014-06-29 NOTE — Progress Notes (Signed)
[facility] PROGRESS NOTE  Devin DomeJerry [name] ZOX:[id number] DOB: 12/27/1970 DOA: 06/10/2014 PCP: No PCP Per Patient  Assessment/Plan: 43 yo M who has been undergoing treatment by the neurosurgery team for back issues, underwent lumbar fusion earlier in the summer and had several evacuations of hematomas. He went to the hospital in [place] and was seen to have another fluid collection in his surgical site. He was transferred here to be admitted by them for treatment.  However, when the EDP evaluated him, he was found to be tachycardic and tender on his abdominal exam. CT was performed, and this demonstrated free air and finding consistent with a perforated ulcer. This goes along with his history as he has been taking narcotics, and many NSAIDs. Patient has had surgical repair of his peptic ulcer and also another evacuation of infected hematoma by neurosurgery. For the last 2 weeks, patient's nutritional status has been poor and albumin is 1.6. Patient is fluid overloaded and neurosurgery has consulted IM for assistance in managing his fluid overload.   1. Fluid overload, bilateral peripheral edema: secondary to hypoalbuminemia, albumin 1.6, 3rd shifting and fluid resuscitation during multiple surgeries- improving now  - After starting Lasix and with albumin infusions, diuresis has significantly improved and -10 L out/negative balance. Metolazone d/c; cont IV lasix decreased to 40 BID - Some patients with hypoalbuminemia are relatively resistant to conventional diuretic therapy, his albumin was only 1.6, now improved. If correcting with nutrition, it will likely take some time to build up his protein and albumin stores.  - UA negative for proteinuria  - Nutrition consult done, continue ensure TID and continue prostat  - Doppler ultrasound of the lower extremities negative for any DVT  - plan to transition to oral Lasix 10/1.   2. Back pain s/p multiple surgeries, wound infection, s/p debridement,  infected hematoma  - management per primary team, neurosurgery  - Infectious disease following, on vancomycin and rifampin, continue for another 38 days, ID followup in 3-4 weeks post hosp  3. Perforated gastric ulcer: Status post exploratory laparotomy and repair of perforated pyloric channel ulcer  - Tolerating oral diet, stable from surgical standpoint per Dr Corliss Skainssuei; d/c NSAIDS 4. DM type 2 (diabetes mellitus, type 2): A1C 5.1;  - continue diet controlled  5. HTN: stable and well controlled.  - Continue current regimen  6. Anemia; likely anemia of chronic disease, normocytic  - Hemoglobin 8.3 after transfusion of 1 unit packed RBCs, start iron PO   - Follow FOBT      Code Status: full Family Communication:  D/w patient, nurse (indicate person spoken with, relationship, and if by phone, the number) Disposition Plan: per primary 24-48 hrs    Consultants:    Procedures:    Antibiotics:   (indicate start date, and stop date if known)  HPI/Subjective: alert  Objective: Filed Vitals:   06/29/14 0520  BP: 131/78  Pulse: 114  Temp: 99 F (37.2 C)  Resp: 17    Intake/Output Summary (Last 24 hours) at 06/29/14 1108 Last data filed at 06/29/14 0942  Gross per 24 hour  Intake   1840 ml  Output   4205 ml  Net  -2365 ml   Filed Weights   06/10/14 0900 06/12/14 0400  Weight: 98.8 kg (217 lb 13 oz) 103.9 kg (229 lb 0.9 oz)    Exam:   General:  alert  Cardiovascular: s1,s2 rrr  Respiratory: CTA BL  Abdomen: soft, nt,nd   Musculoskeletal: no mild edema improved  Data Reviewed: Basic Metabolic Panel:  Recent Labs Lab 06/24/14 1500 06/25/14 0358 06/26/14 0500 06/27/14 0455 06/28/14 0610 06/29/14 0600  NA 136* 139 140 139 137 133*  K 3.8 3.7 3.7 3.9 3.5* 3.2*  CL 100 101 102 101 93* 88*  CO2 24 22 26 29  32 32  GLUCOSE 94 120* 92 90 92 104*  BUN 9 9 10 12 11 13   CREATININE 0.82 0.66 0.72 0.77 0.82 0.86  CALCIUM 9.0 8.0* 8.1* 8.1* 8.4 8.5  MG 2.0   --   --   --   --   --   PHOS  --   --  4.2  --   --   --    Liver Function Tests:  Recent Labs Lab 06/26/14 0500 06/27/14 0455 06/29/14 0600  AST 9  --   --   ALT 6  --   --   ALKPHOS 126*  --   --   BILITOT <0.2*  --   --   PROT 7.1  --   --   ALBUMIN 1.6* 1.7* 2.3*   No results found for this basename: LIPASE, AMYLASE,  in the last 168 hours No results found for this basename: AMMONIA,  in the last 168 hours CBC:  Recent Labs Lab 06/25/14 0358 06/26/14 0500 06/26/14 1755 06/27/14 0455 06/28/14 0610 06/29/14 0600  WBC 13.4* 12.1*  --  10.7* 9.4 11.7*  HGB 7.7* 7.6* 8.3* 7.6* 8.3* 8.4*  HCT 25.2* 25.0* 26.7* 24.6* 26.3* 27.1*  MCV 91.0 92.3  --  90.4 89.2 90.9  PLT 613* 557*  --  497* 520* 533*   Cardiac Enzymes: No results found for this basename: CKTOTAL, CKMB, CKMBINDEX, TROPONINI,  in the last 168 hours BNP (last 3 results) No results found for this basename: PROBNP,  in the last 8760 hours CBG:  Recent Labs Lab 06/22/14 1948 06/23/14 0011 06/23/14 0405 06/23/14 0743 06/23/14 1152  GLUCAP 109* 104* 90 88 108*    No results found for this or any previous visit (from the past 240 hour(s)).   Studies: No results found.  Scheduled Meds: . sodium chloride   Intravenous Once  . sodium chloride   Intravenous Once  . sodium chloride   Intravenous Once  . dextrose  50 mL Intravenous Once  . feeding supplement (PRO-STAT SUGAR FREE 64)  30 mL Oral BID WC  . furosemide  40 mg Intravenous 3 times per day  . metolazone  5 mg Oral Daily  . multivitamin with minerals  1 tablet Oral Daily  . pantoprazole  40 mg Oral BID  . potassium chloride  40 mEq Oral Daily  . rifampin  300 mg Oral Q12H  . sodium chloride  3 mL Intravenous Q12H  . vancomycin  1,000 mg Intravenous Q12H   Continuous Infusions: . sodium chloride 250 mL (06/20/14 1610)    Active Problems:   Perforated gastric ulcer   Acute respiratory failure, unspecified whether with hypoxia or  hypercapnia   Peritonitis (acute) generalized   Pain   AKI (acute kidney injury)   DM type 2 (diabetes mellitus, type 2)    Time spent: >35 minutes     Esperanza Sheets  [facility] Pager [phone]. If 7PM-7AM, please contact night-coverage at [email or web address], password [facility] 06/29/2014, 11:08 AM  LOS: 19 days

## 2014-06-29 NOTE — Clinical Social Work Note (Signed)
CS[facility] spoke with [facility] and [facility] have no beds available at this time.  CSW informed patient.  Patient is agreeable to home health.  CSW informed case manager who will follow up with patient for home health needs.  CSW signing off.  Merlyn LotJenna Holoman, LCSWA Clinical Social Worker [phone]

## 2014-06-29 NOTE — Progress Notes (Signed)
Results of EKG called to hospitalist.

## 2014-06-29 NOTE — Progress Notes (Signed)
Physical Therapy Treatment Patient Details Name: Devin [name] MRN: [id number] DOB: 08/20/1971 Today's Date: 06/29/2014    History of Present Illness Pt is a 43 yo M who has been undergoing treatment by the neurosurgery team for back issues. He underwent lumbar fusion earlier in the summer and had several evacuations of hematomas. He went to the hospital in [place] and was seen to have another fluid collection in his surgical site.  Since admission pt has undergone [name], repair of perforated pyloric channel ulcer on 06/10/14.  And most recently s/p Debridement of wound, removal of S1 pedicular screws.      PT Comments    Progressing steadily.  Gave pt a hip flexor stretch exercise that might be useful to help straight his back during gait.   Follow Up Recommendations  Home health PT;Supervision/Assistance - 24 hour     Equipment Recommendations  None recommended by PT    Recommendations for Other Services       Precautions / Restrictions Precautions Precautions: Back Required Braces or Orthoses: Spinal Brace Spinal Brace: Thoracolumbosacral orthotic;Applied in sitting position    Mobility  Bed Mobility               General bed mobility comments: out of bed in chair.  Transfers Overall transfer level: Needs assistance Equipment used: Rolling walker (2 wheeled) Transfers: Sit to/from Stand Sit to Stand: Supervision         General transfer comment: cues for hand placement  Ambulation/Gait Ambulation/Gait assistance: Supervision Ambulation Distance (Feet): 600 Feet Assistive device: Rolling walker (2 wheeled) Gait Pattern/deviations: Step-through pattern;Trunk flexed Gait velocity: generally steady, safe, functional speed, but significant truncal flexion.       Stairs            Wheelchair Mobility    Modified Rankin (Stroke Patients Only)       Balance Overall balance assessment: Needs assistance   Sitting  balance-Leahy Scale: Good     Standing balance support: Bilateral upper extremity supported Standing balance-Leahy Scale: Fair                      Cognition Arousal/Alertness: Awake/alert Behavior During Therapy: WFL for tasks assessed/performed Overall Cognitive Status: Within Functional Limits for tasks assessed                      Exercises      General Comments General comments (skin integrity, edema, etc.): Stressed to pt that if MD though pt needed the brace, that he likely wanted it on at all times when up OOB.      Pertinent Vitals/Pain Pain Assessment: No/denies pain    Home Living                      Prior Function            PT Goals (current goals can now be found in the care plan section) Acute Rehab PT Goals Patient Stated Goal: go outside PT Goal Formulation: With patient Time For Goal Achievement: 07/02/14 Potential to Achieve Goals: Good Progress towards PT goals: Progressing toward goals    Frequency  Min 4X/week    PT Plan Current plan remains appropriate    Co-evaluation             End of Session Equipment Utilized During Treatment: Back brace Activity Tolerance: Patient tolerated treatment well Patient left: in chair;with call bell/phone within reach  Time: 1610-9604 PT Time Calculation (min): 19 min  Charges:  $Gait Training: 8-22 mins                    G Codes:      Devin [name], Eliseo Gum 06/29/2014, 4:28 PM 06/29/2014  [place] Bing, PT [phone]  (pager)

## 2014-06-29 NOTE — Progress Notes (Signed)
Patient ID: Devin DomeJerry [name], male   DOB: 12/29/1970, 43 y.o.   MRN: [id number] Remains on antibiotics. Will work on the [employer] [facility]. Would like to go outside of hospital as he is been inside for last 3 weeks

## 2014-06-29 NOTE — Progress Notes (Signed)
ANTIBIOTIC CONSULT NOTE - FOLLOW UP  Pharmacy Consult:  Vancomycin Indication:  CSF culture positive for MRSA  Allergies  Allergen Reactions  . Codeine Other (See Comments)    Skin "crawl"  . Hydrocodone Other (See Comments)    Skin "crawl"  . Other Nausea Only and Other (See Comments)    Darvocet    Patient Measurements: Height: 5\' 10"  (177.8 cm) Weight: 229 lb 0.9 oz (103.9 kg) IBW/kg (Calculated) : 73  Vital Signs: Temp: 98.8 F (37.1 C) (09/30 1243) Temp src: Oral (09/30 1243) BP: 131/80 mmHg (09/30 1243) Pulse Rate: 96 (09/30 1243) Intake/Output from previous day: 09/29 0701 - 09/30 0700 In: 1600 [P.O.:1400; IV Piggyback:200] Out: 4805 [Urine:4805] Intake/Output from this shift: Total I/O In: 483 [P.O.:480; I.V.:3] Out: 300 [Urine:300]  Labs:  Recent Labs  06/27/14 0455 06/27/14 1222 06/28/14 0610 06/29/14 0600  WBC 10.7*  --  9.4 11.7*  HGB 7.6*  --  8.3* 8.4*  PLT 497*  --  520* 533*  LABCREA  --  121.6  --   --   CREATININE 0.77  --  0.82 0.86   Estimated Creatinine Clearance: 131 ml/min (by C-G formula based on Cr of 0.86).  Recent Labs  06/27/14 1142 06/29/14 1230  VANCOTROUGH 21.9* 19.9     Microbiology: Recent Results (from the past 720 hour(s))  MRSA PCR SCREENING     Status: Abnormal   Collection Time    06/10/14  9:14 AM      Result Value Ref Range Status   MRSA by PCR POSITIVE (*) NEGATIVE Final   Comment:            The GeneXpert MRSA Assay (FDA     approved for NASAL specimens     only), is one component of a     comprehensive MRSA colonization     surveillance program. It is not     intended to diagnose MRSA     infection nor to guide or     monitor treatment for     MRSA infections.     RESULT CALLED TO, READ BACK BY AND VERIFIED WITH:     Ricci Barker RN 11:30 06/10/14 ([email or web address])  URINE CULTURE     Status: None   Collection Time    06/10/14  9:14 AM      Result Value Ref Range Status   Specimen Description URINE,  CATHETERIZED   Final   Special Requests Normal   Final   Culture  Setup Time     Final   Value: 06/10/2014 17:14     Performed at [facility] Count     Final   Value: NO GROWTH     Performed at [facility]   Culture     Final   Value: NO GROWTH     Performed at [facility]   Report Status 06/11/2014 FINAL   Final  WOUND CULTURE     Status: None   Collection Time    06/14/14  6:40 PM      Result Value Ref Range Status   Specimen Description WOUND   Final   Special Requests PUS FROM BACK WOUND   Final   Gram Stain     Final   Value: ABUNDANT WBC PRESENT,BOTH PMN AND MONONUCLEAR     NO ORGANISMS SEEN     Performed at [facility]     Performed at [facility]   Culture  Final   Value: FEW METHICILLIN RESISTANT STAPHYLOCOCCUS AUREUS     Note: RIFAMPIN AND GENTAMICIN SHOULD NOT BE USED AS SINGLE DRUGS FOR TREATMENT OF STAPH INFECTIONS. This organism DOES NOT demonstrate inducible Clindamycin resistance in vitro. CRITICAL RESULT CALLED TO, READ BACK BY AND VERIFIED WITH: TARA CROSS      06/17/14 1300 BY SMITH     Performed at [facility]   Report Status 06/22/2014 FINAL   Final   Organism ID, Bacteria METHICILLIN RESISTANT STAPHYLOCOCCUS AUREUS   Final  GRAM STAIN     Status: None   Collection Time    06/14/14  6:40 PM      Result Value Ref Range Status   Specimen Description WOUND   Final   Special Requests PUS FROM BACK WOUND   Final   Gram Stain     Final   Value: ABUNDANT WBC PRESENT,BOTH PMN AND MONONUCLEAR     NO ORGANISMS SEEN   Report Status 06/22/2014 FINAL   Final  WOUND CULTURE     Status: None   Collection Time    06/17/14  4:57 PM      Result Value Ref Range Status   Specimen Description WOUND BACK   Final   Special Requests NONE   Final   Gram Stain     Final   Value: FEW WBC PRESENT,BOTH PMN AND MONONUCLEAR     NO SQUAMOUS EPITHELIAL CELLS SEEN     NO ORGANISMS SEEN     Performed at [facility]   Culture     Final   Value: NO GROWTH 2 DAYS     Performed at [facility]   Report Status 06/20/2014 FINAL   Final  ANAEROBIC CULTURE     Status: None   Collection Time    06/17/14  4:57 PM      Result Value Ref Range Status   Specimen Description WOUND BACK   Final   Special Requests NONE   Final   Gram Stain     Final   Value: FEW WBC PRESENT,BOTH PMN AND MONONUCLEAR     NO SQUAMOUS EPITHELIAL CELLS SEEN     NO ORGANISMS SEEN     Performed at [facility]   Culture     Final   Value: NO ANAEROBES ISOLATED     Performed at [facility]   Report Status 06/22/2014 FINAL   Final    Assessment: 43 YOM continues on vancomycin for positive CSF culture for MRSA and peritonitis.  He is s/p OR for debridment of lumbar wound. Last vancomycin trough slightly elevated but is now at goal after a dose reduction.   Vanc 9/11 >> 9/13, resumed 9/19 >> Rifampin 9/22 >>  Zosyn 9/11 >> 9/21 Fluc 9/12 >> 9/18  9/22 VT = 13.6 mcg/mL on 1g q12 >> incr 1250mg  q12 9/28 VT = 21.9 on 1250mg  Q12H >>decrease back to 1gm Q12h (may be accumulating) 9/30 VT = 19.9 on 1gm Q12H  9/11 urine - negative MRSA PCR - positive 9/15 CSF - few MRSA 9/18 wound culture - NGTD  Goal of Therapy:  Vancomycin trough level 15-20 mcg/ml  Plan:  - Continue vanc 1gm IV Q12H - Continue rifampin 300mg  PO BID per MD - Monitor renal fxn, clinical course, weekly VT to r/o accumulation  Lysle Pearlachel Harjot Dibello, PharmD, BCPS Pager # [phone] 06/29/2014 2:04 PM

## 2014-06-29 NOTE — Progress Notes (Deleted)
Notified Dr. Danielle DessElsner of EKG results.

## 2014-06-30 ENCOUNTER — Inpatient Hospital Stay (HOSPITAL_COMMUNITY): Payer: BC Managed Care – PPO

## 2014-06-30 DIAGNOSIS — E8809 Other disorders of plasma-protein metabolism, not elsewhere classified: Secondary | ICD-10-CM

## 2014-06-30 DIAGNOSIS — E1140 Type 2 diabetes mellitus with diabetic neuropathy, unspecified: Secondary | ICD-10-CM

## 2014-06-30 DIAGNOSIS — E8770 Fluid overload, unspecified: Secondary | ICD-10-CM

## 2014-06-30 DIAGNOSIS — I10 Essential (primary) hypertension: Secondary | ICD-10-CM

## 2014-06-30 LAB — BASIC METABOLIC PANEL
Anion gap: 12 (ref 5–15)
BUN: 14 mg/dL (ref 6–23)
CO2: 32 mEq/L (ref 19–32)
Calcium: 8.7 mg/dL (ref 8.4–10.5)
Chloride: 92 mEq/L — ABNORMAL LOW (ref 96–112)
Creatinine, Ser: 0.89 mg/dL (ref 0.50–1.35)
GFR calc Af Amer: >90 mL/min (ref >90)
GFR calc non Af Amer: >90 mL/min (ref >90)
Glucose, Bld: 102 mg/dL — ABNORMAL HIGH (ref 70–99)
Potassium: 3.6 mEq/L — ABNORMAL LOW (ref 3.7–5.3)
Sodium: 136 mEq/L — ABNORMAL LOW (ref 137–147)

## 2014-06-30 LAB — MICROALBUMIN / CREATININE URINE RATIO
Creatinine, Urine: 121.6 mg/dL
MICROALB/CREAT RATIO: 5.8 mg/g (ref 0.0–30.0)
Microalb, Ur: 0.7 mg/dL (ref 0.00–1.89)

## 2014-06-30 MED ORDER — PREGABALIN 50 MG PO CAPS
50.0000 mg | ORAL_CAPSULE | Freq: Two times a day (BID) | ORAL | Status: DC
  Administered 2014-06-30 – 2014-07-02 (×5): 50 mg via ORAL
  Filled 2014-06-30 (×5): qty 1

## 2014-06-30 MED ORDER — METOPROLOL TARTRATE 25 MG PO TABS: 25.0000 mg | ORAL_TABLET | Freq: Two times a day (BID) | ORAL | Status: AC

## 2014-06-30 MED ORDER — CIPROFLOXACIN HCL 0.3 % OP SOLN
2.0000 [drp] | OPHTHALMIC | Status: DC
  Administered 2014-06-30 – 2014-07-02 (×19): 2 [drp] via OPHTHALMIC
  Filled 2014-06-30: qty 2.5

## 2014-06-30 MED ORDER — FUROSEMIDE 20 MG PO TABS
20.0000 mg | ORAL_TABLET | Freq: Every day | ORAL | Status: DC
  Administered 2014-06-30 – 2014-07-02 (×3): 20 mg via ORAL
  Filled 2014-06-30 (×3): qty 1

## 2014-06-30 MED ORDER — POTASSIUM CHLORIDE ER 10 MEQ PO TBCR: 10.0000 meq | EXTENDED_RELEASE_TABLET | ORAL | Status: AC

## 2014-06-30 MED ORDER — FUROSEMIDE 20 MG PO TABS: 20.0000 mg | ORAL_TABLET | ORAL | Status: AC

## 2014-06-30 MED ORDER — PREGABALIN 50 MG PO CAPS: 50.0000 mg | ORAL_CAPSULE | Freq: Two times a day (BID) | ORAL | Status: DC

## 2014-06-30 MED ORDER — POTASSIUM CHLORIDE CRYS ER 20 MEQ PO TBCR
40.0000 meq | EXTENDED_RELEASE_TABLET | Freq: Every day | ORAL | Status: DC
  Administered 2014-06-30 – 2014-07-02 (×3): 40 meq via ORAL
  Filled 2014-06-30 (×2): qty 2

## 2014-06-30 MED ORDER — METOPROLOL TARTRATE 25 MG PO TABS
25.0000 mg | ORAL_TABLET | Freq: Two times a day (BID) | ORAL | Status: DC
  Administered 2014-06-30 – 2014-07-02 (×5): 25 mg via ORAL
  Filled 2014-06-30 (×6): qty 1

## 2014-06-30 MED ORDER — PANTOPRAZOLE SODIUM 40 MG PO TBEC: 40.0000 mg | DELAYED_RELEASE_TABLET | Freq: Two times a day (BID) | ORAL | Status: DC

## 2014-06-30 NOTE — Progress Notes (Signed)
Patient ID: Devin DomeJerry [name], male   DOB: 09/25/1971, 43 y.o.   MRN: [id number] Vital signs are stable Peripheral edema still significant Condition is improving slowly Looking for skilled nursing facility for patient

## 2014-06-30 NOTE — Care Management (Signed)
06-30-14 [facility] to schedule pick up of wound VAC . She will arrange same.  Ronny FlurryHeather Kenrick Pore RN BSN

## 2014-06-30 NOTE — Progress Notes (Signed)
PT Cancellation Note  Patient Details Name: Devin [name] MRN: [id number] DOB: 08/30/1971   Cancelled Treatment:    Reason Eval/Treat Not Completed: Patient declined, no reason specified.  Will see later as able. 06/30/2014  Devin [name], PT [phone]  (pager)   Devin [name], Devin [name] 06/30/2014, 1:32 PM

## 2014-06-30 NOTE — Progress Notes (Addendum)
[facility] PROGRESS NOTE  Devin DomeJerry Dellarocco ZOX:[id number] DOB: 06/27/1971 DOA: 06/10/2014 PCP: No PCP Per Patient  Assessment/Plan: 43 yo M who has been undergoing treatment by the neurosurgery team for back issues, underwent lumbar fusion earlier in the summer and had several evacuations of hematomas. He went to the hospital in [place] and was seen to have another fluid collection in his surgical site. He was transferred here to be admitted by them for treatment.  However, when the EDP evaluated him, he was found to be tachycardic and tender on his abdominal exam. CT was performed, and this demonstrated free air and finding consistent with a perforated ulcer. This goes along with his history as he has been taking narcotics, and many NSAIDs. Patient has had surgical repair of his peptic ulcer and also another evacuation of infected hematoma by neurosurgery. For the last 2 weeks, patient's nutritional status has been poor and albumin is 1.6. Patient is fluid overloaded and neurosurgery has consulted IM for assistance in managing his fluid overload.   1. Fluid overload, bilateral peripheral edema: secondary to hypoalbuminemia, albumin 1.6, 3rd shifting and fluid resuscitation during multiple surgeries- improving now  - After starting Lasix and with albumin infusions, diuresis has significantly improved and -10 L out/negative balance on Metolazone+IV lasix - Some patients with hypoalbuminemia are relatively resistant to conventional diuretic therapy, his albumin was only 1.6, now improved. If correcting with nutrition, it will likely take some time to build up his protein and albumin stores.  - UA negative for proteinuria  - Nutrition consult done, continue ensure TID and continue prostat  - Doppler ultrasound of the lower extremities negative for any DVT  - changed to oral Lasix on 10/1+potassium, outpatient BMP in 1 week with PCP  2. Back pain s/p multiple surgeries, osteomyelitis,  mrsa wound  infection, s/p debridement, infected hematoma - management per primary team, neurosurgery  - Infectious disease following, on vancomycin and rifampin, continue for another 38 days, ID followup in 3-4 weeks post hosp  - mild fever (9/30), denies nausea, vomiting or diarrhea, no abd pain, no cough, no SOB; obtain CXR  3. Perforated gastric ulcer: Status post exploratory laparotomy and repair of perforated pyloric channel ulcer  - Tolerating oral diet, stable from surgical standpoint per Dr Corliss Skainssuei; d/c NSAIDS 4. DM type 2 (diabetes mellitus, type 2): A1C 5.1;  - continue diet controlled  5. HTN, stable, changed to BB to control HR/BP; hold lisinopril  6. Anemia; likely anemia of chronic disease, normocytic  - Hemoglobin 8.3 after transfusion of 1 unit packed RBCs, start iron PO   - Follow FOBT  7. Peripheral neuropathy, pain; trial of lyrica  8. L eye conjunctivitis; vision is intact; periorbital muscle movement is intact; try cipro eye drops    Code Status: full Family Communication:  D/w patient, nurse (indicate person spoken with, relationship, and if by phone, the number) Disposition Plan: per primary    Consultants:    Procedures:    Antibiotics:   (indicate start date, and stop date if known)  HPI/Subjective: alert  Objective: Filed Vitals:   06/30/14 0522  BP: 133/84  Pulse: 104  Temp: 98.3 F (36.8 C)  Resp: 20    Intake/Output Summary (Last 24 hours) at 06/30/14 0918 Last data filed at 06/30/14 0600  Gross per 24 hour  Intake   1603 ml  Output   2200 ml  Net   -597 ml   Filed Weights   06/10/14 0900 06/12/14 0400  Weight:  98.8 kg (217 lb 13 oz) 103.9 kg (229 lb 0.9 oz)    Exam:   General:  alert  Cardiovascular: s1,s2 rrr  Respiratory: CTA BL  Abdomen: soft, nt,nd   Musculoskeletal: no mild edema improved    Data Reviewed: Basic Metabolic Panel:  Recent Labs Lab 06/24/14 1500 06/25/14 0358 06/26/14 0500 06/27/14 0455 06/28/14 0610  06/29/14 0600 06/30/14 0520  NA 136* 139 140 139 137 133* 136*  K 3.8 3.7 3.7 3.9 3.5* 3.2* 3.6*  CL 100 101 102 101 93* 88* 92*  CO2 24 22 26 29  32 32 32  GLUCOSE 94 120* 92 90 92 104* 102*  BUN 9 9 10 12 11 13 14   CREATININE 0.82 0.66 0.72 0.77 0.82 0.86 0.89  CALCIUM 9.0 8.0* 8.1* 8.1* 8.4 8.5 8.7  MG 2.0  --   --   --   --   --   --   PHOS  --   --  4.2  --   --   --   --    Liver Function Tests:  Recent Labs Lab 06/26/14 0500 06/27/14 0455 06/29/14 0600  AST 9  --   --   ALT 6  --   --   ALKPHOS 126*  --   --   BILITOT <0.2*  --   --   PROT 7.1  --   --   ALBUMIN 1.6* 1.7* 2.3*   No results found for this basename: LIPASE, AMYLASE,  in the last 168 hours No results found for this basename: AMMONIA,  in the last 168 hours CBC:  Recent Labs Lab 06/25/14 0358 06/26/14 0500 06/26/14 1755 06/27/14 0455 06/28/14 0610 06/29/14 0600  WBC 13.4* 12.1*  --  10.7* 9.4 11.7*  HGB 7.7* 7.6* 8.3* 7.6* 8.3* 8.4*  HCT 25.2* 25.0* 26.7* 24.6* 26.3* 27.1*  MCV 91.0 92.3  --  90.4 89.2 90.9  PLT 613* 557*  --  497* 520* 533*   Cardiac Enzymes: No results found for this basename: CKTOTAL, CKMB, CKMBINDEX, TROPONINI,  in the last 168 hours BNP (last 3 results) No results found for this basename: PROBNP,  in the last 8760 hours CBG:  Recent Labs Lab 06/23/14 1152  GLUCAP 108*    No results found for this or any previous visit (from the past 240 hour(s)).   Studies: No results found.  Scheduled Meds: . sodium chloride   Intravenous Once  . sodium chloride   Intravenous Once  . sodium chloride   Intravenous Once  . dextrose  50 mL Intravenous Once  . feeding supplement (PRO-STAT SUGAR FREE 64)  30 mL Oral BID WC  . furosemide  20 mg Oral Daily  . multivitamin with minerals  1 tablet Oral Daily  . pantoprazole  40 mg Oral BID  . potassium chloride  40 mEq Oral Daily  . pregabalin  50 mg Oral BID  . rifampin  300 mg Oral Q12H  . sodium chloride  3 mL Intravenous  Q12H  . vancomycin  1,000 mg Intravenous Q12H   Continuous Infusions: . sodium chloride 250 mL (06/20/14 9604)    Active Problems:   Perforated gastric ulcer   Acute respiratory failure, unspecified whether with hypoxia or hypercapnia   Peritonitis (acute) generalized   Pain   AKI (acute kidney injury)   DM type 2 (diabetes mellitus, type 2)    Time spent: >35 minutes     Esperanza Sheets  [facility] Pager [phone]. If  7PM-7AM, please contact night-coverage at [email or web address], password [facility] 06/30/2014, 9:18 AM  LOS: 20 days

## 2014-06-30 NOTE — Progress Notes (Signed)
Patient examined and I agree with the assessment and plan  Violeta GelinasBurke Dorethea Strubel, MD, MPH, FACS Trauma: [phone] General Surgery: [phone]  06/30/2014 2:04 PM

## 2014-06-30 NOTE — Progress Notes (Signed)
Patient ID: Devin [name], male   DOB: Aug 14, 1971, 43 y.o.   MRN: [id number] 13 Days Post-Op  Subjective: Patient doing well. Without abdominal complaints. Wound healing well by secondary intension with wet to dry dressings.  Objective: Vital signs in last 24 hours: Temp:  [98.3 F (36.8 C)-101.5 F (38.6 C)] 98.3 F (36.8 C) (10/01 0522) Pulse Rate:  [96-116] 104 (10/01 0522) Resp:  [18-20] 20 (10/01 0522) BP: (131-133)/(80-84) 133/84 mmHg (10/01 0522) SpO2:  [93 %-97 %] 95 % (10/01 0522) Last BM Date: 06/28/14  Intake/Output from previous day: 09/30 0701 - 10/01 0700 In: 1603 [P.O.:1320; I.V.:83; IV Piggyback:200] Out: 2200 [Urine:2200] Intake/Output this shift:    PE: XLK:GMWN, NT, healing well with granulation tissue  Lab Results:   Recent Labs  06/28/14 0610 06/29/14 0600  WBC 9.4 11.7*  HGB 8.3* 8.4*  HCT 26.3* 27.1*  PLT 520* 533*   BMET  Recent Labs  06/29/14 0600 06/30/14 0520  NA 133* 136*  K 3.2* 3.6*  CL 88* 92*  CO2 32 32  GLUCOSE 104* 102*  BUN 13 14  CREATININE 0.86 0.89  CALCIUM 8.5 8.7   PT/INR No results found for this basename: LABPROT, INR,  in the last 72 hours CMP     Component Value Date/Time   NA 136* 06/30/2014 0520   K 3.6* 06/30/2014 0520   CL 92* 06/30/2014 0520   CO2 32 06/30/2014 0520   GLUCOSE 102* 06/30/2014 0520   BUN 14 06/30/2014 0520   CREATININE 0.89 06/30/2014 0520   CALCIUM 8.7 06/30/2014 0520   PROT 7.1 06/26/2014 0500   ALBUMIN 2.3* 06/29/2014 0600   AST 9 06/26/2014 0500   ALT 6 06/26/2014 0500   ALKPHOS 126* 06/26/2014 0500   BILITOT <0.2* 06/26/2014 0500   GFRNONAA >90 06/30/2014 0520   GFRAA >90 06/30/2014 0520   Lipase  No results found for this basename: lipase       Studies/Results: No results found.  Anti-infectives: Anti-infectives   Start     Dose/Rate Route Frequency Ordered Stop   06/28/14 0100  vancomycin (VANCOCIN) IVPB 1000 mg/200 mL premix     1,000 mg 200 mL/hr over 60 Minutes Intravenous  Every 12 hours 06/27/14 1247     06/27/14 2200  rifampin (RIFADIN) capsule 300 mg     300 mg Oral Every 12 hours 06/27/14 1332     06/22/14 0000  vancomycin (VANCOCIN) 1,250 mg in sodium chloride 0.9 % 250 mL IVPB  Status:  Discontinued     1,250 mg 166.7 mL/hr over 90 Minutes Intravenous Every 12 hours 06/21/14 1706 06/27/14 1246   06/21/14 1700  rifampin (RIFADIN) capsule 300 mg  Status:  Discontinued     300 mg Oral Daily 06/21/14 1557 06/27/14 1332   06/19/14 0300  vancomycin (VANCOCIN) IVPB 1000 mg/200 mL premix  Status:  Discontinued     1,000 mg 200 mL/hr over 60 Minutes Intravenous Every 12 hours 06/19/14 0231 06/21/14 1706   06/19/14 0200  vancomycin (VANCOCIN) 500 mg in sodium chloride 0.9 % 100 mL IVPB  Status:  Discontinued     500 mg 100 mL/hr over 60 Minutes Intravenous Every 12 hours 06/18/14 1335 06/19/14 0231   06/18/14 1330  vancomycin (VANCOCIN) 2,000 mg in sodium chloride 0.9 % 500 mL IVPB     2,000 mg 250 mL/hr over 120 Minutes Intravenous  Once 06/18/14 1319 06/18/14 1602   06/17/14 1608  bacitracin 50,000 Units in sodium chloride irrigation 0.9 % 500  mL irrigation  Status:  Discontinued       As needed 06/17/14 1608 06/17/14 1708   06/11/14 0900  fluconazole (DIFLUCAN) IVPB 200 mg     200 mg 100 mL/hr over 60 Minutes Intravenous Every 24 hours 06/11/14 0832 06/17/14 1100   06/10/14 1900  vancomycin (VANCOCIN) 1,250 mg in sodium chloride 0.9 % 250 mL IVPB  Status:  Discontinued     1,250 mg 166.7 mL/hr over 90 Minutes Intravenous Every 8 hours 06/10/14 0927 06/12/14 0954   06/10/14 1700  piperacillin-tazobactam (ZOSYN) IVPB 3.375 g  Status:  Discontinued     3.375 g 12.5 mL/hr over 240 Minutes Intravenous Every 8 hours 06/10/14 0927 06/20/14 1050   06/10/14 1100  vancomycin (VANCOCIN) 1,750 mg in sodium chloride 0.9 % 500 mL IVPB     1,750 mg 250 mL/hr over 120 Minutes Intravenous  Once 06/10/14 0927 06/10/14 1256   06/10/14 1030  piperacillin-tazobactam (ZOSYN)  IVPB 3.375 g     3.375 g 100 mL/hr over 30 Minutes Intravenous  Once 06/10/14 0927 06/10/14 1030   06/10/14 0300  piperacillin-tazobactam (ZOSYN) IVPB 3.375 g     3.375 g 12.5 mL/hr over 240 Minutes Intravenous  Once 06/10/14 0249 06/10/14 0444       Assessment/Plan  POD #18. Laparotomy and repair of perforated pyloric channel ulcer.  Plan:  1. Cont local wound care. Patient surgically stable. Will follow every couple of days for wound check while here.    LOS: 20 days    Jaleel Allen LEE 06/30/2014, 10:21 AM Pager: [phone]

## 2014-07-01 DIAGNOSIS — N179 Acute kidney failure, unspecified: Secondary | ICD-10-CM

## 2014-07-01 DIAGNOSIS — E119 Type 2 diabetes mellitus without complications: Secondary | ICD-10-CM

## 2014-07-01 LAB — CBC
HCT: 25.7 % — ABNORMAL LOW (ref 39.0–52.0)
Hemoglobin: 7.9 g/dL — ABNORMAL LOW (ref 13.0–17.0)
MCH: 28.2 pg (ref 26.0–34.0)
MCHC: 30.7 g/dL (ref 30.0–36.0)
MCV: 91.8 fL (ref 78.0–100.0)
PLATELETS: 485 10*3/uL — AB (ref 150–400)
RBC: 2.8 MIL/uL — ABNORMAL LOW (ref 4.22–5.81)
RDW: 18.9 % — AB (ref 11.5–15.5)
WBC: 9.4 10*3/uL (ref 4.0–10.5)

## 2014-07-01 LAB — OCCULT BLOOD X 1 CARD TO LAB, STOOL: FECAL OCCULT BLD: NEGATIVE

## 2014-07-01 MED ORDER — CIPROFLOXACIN HCL 0.3 % OP SOLN: 2.0000 [drp] | OPHTHALMIC | Status: DC

## 2014-07-01 MED ORDER — VANCOMYCIN HCL IN DEXTROSE 1-5 GM/200ML-% IV SOLN: 1000.0000 mg | Freq: Two times a day (BID) | INTRAVENOUS | Status: AC

## 2014-07-01 MED ORDER — OXYCODONE HCL ER 15 MG PO T12A: 15.0000 mg | EXTENDED_RELEASE_TABLET | Freq: Two times a day (BID) | ORAL | Status: DC

## 2014-07-01 MED ORDER — OXYCODONE HCL 5 MG PO TABS: 5.0000 mg | ORAL_TABLET | ORAL | Status: DC | PRN

## 2014-07-01 MED ORDER — ONDANSETRON HCL 4 MG PO TABS: 4.0000 mg | ORAL_TABLET | Freq: Four times a day (QID) | ORAL | Status: DC | PRN

## 2014-07-01 MED ORDER — FERROUS SULFATE 325 (65 FE) MG PO TABS
325.0000 mg | ORAL_TABLET | Freq: Three times a day (TID) | ORAL | Status: DC
  Administered 2014-07-01 – 2014-07-02 (×4): 325 mg via ORAL
  Filled 2014-07-01 (×6): qty 1

## 2014-07-01 MED ORDER — RIFAMPIN 300 MG PO CAPS: 300.0000 mg | ORAL_CAPSULE | Freq: Two times a day (BID) | ORAL | Status: DC

## 2014-07-01 MED ORDER — FERROUS SULFATE 325 (65 FE) MG PO TABS: 325.0000 mg | ORAL_TABLET | Freq: Three times a day (TID) | ORAL | Status: DC

## 2014-07-01 MED ORDER — PRO-STAT SUGAR FREE PO LIQD: 30.0000 mL | Freq: Two times a day (BID) | ORAL | Status: DC

## 2014-07-01 MED ORDER — DIAZEPAM 5 MG PO TABS: 5.0000 mg | ORAL_TABLET | Freq: Four times a day (QID) | ORAL | Status: DC | PRN

## 2014-07-01 NOTE — Progress Notes (Signed)
Physical Therapy Treatment Patient Details Name: Devin [name] MRN: [id number] DOB: 03/21/1971 Today's Date: 07/01/2014    History of Present Illness Pt is a 43 yo M who has been undergoing treatment by the neurosurgery team for back issues. He underwent lumbar fusion earlier in the summer and had several evacuations of hematomas. He went to the hospital in [place] and was seen to have another fluid collection in his surgical site.  Since admission pt has undergone [name], repair of perforated pyloric channel ulcer on 06/10/14.  And most recently s/p Debridement of wound, removal of S1 pedicular screws.      PT Comments    Pt in good spirits.  Stated he was going home tomorrow.  Mobility Mod Indep progressing well.  Follow Up Recommendations  Home health PT;Supervision/Assistance - 24 hour     Equipment Recommendations       Recommendations for Other Services       Precautions / Restrictions Precautions Precautions: Back Spinal Brace: Thoracolumbosacral orthotic;Applied in sitting position Other Brace/Splint: new TLSO delievered and fitted by [employer]; pt tolerating well but still required assist to properly apply Restrictions Weight Bearing Restrictions: No    Mobility  Bed Mobility               General bed mobility comments: out of bed in chair.  Transfers Overall transfer level: Needs assistance Equipment used: None Transfers: Sit to/from Stand Sit to Stand: Modified independent (Device/Increase time)         General transfer comment: good safety cognition and tech  Ambulation/Gait Ambulation/Gait assistance: Modified independent (Device/Increase time) Ambulation Distance (Feet): 600 Feet Assistive device: Rolling walker (2 wheeled) Gait Pattern/deviations: Step-through pattern Gait velocity: generally steady, safe, functional speed, but significant truncal flexion.   General Gait Details: no LOB noted with mobility; min cues for  safety and upright posture.  still c/o foot pain/sweiing but improved.   Stairs            Wheelchair Mobility    Modified Rankin (Stroke Patients Only)       Balance                                    Cognition                            Exercises      General Comments        Pertinent Vitals/Pain      Home Living                      Prior Function            PT Goals (current goals can now be found in the care plan section) Progress towards PT goals: Progressing toward goals    Frequency  Min 4X/week    PT Plan      Co-evaluation             End of Session Equipment Utilized During Treatment: Back brace Activity Tolerance: Patient tolerated treatment well Patient left: in chair;with call bell/phone within reach     Time: 1035-1045 PT Time Calculation (min): 10 min  Charges:  $Gait Training: 8-22 mins                    G Codes:      Felecia ShellingLori Shinichi Anguiano  PTA  [facility]  Acute  Rehab Pager      [phone]

## 2014-07-01 NOTE — Progress Notes (Signed)
[facility] PROGRESS NOTE  Devin [name] ZOX:[id number] DOB: 04-20-1971 DOA: 06/10/2014 PCP: No PCP Per Patient  Assessment/Plan: 43 yo M who has been undergoing treatment by the neurosurgery team for back issues, underwent lumbar fusion earlier in the summer and had several evacuations of hematomas. He went to the hospital in [place] and was seen to have another fluid collection in his surgical site. He was transferred here to be admitted by them for treatment.  However, when the EDP evaluated him, he was found to be tachycardic and tender on his abdominal exam. CT was performed, and this demonstrated free air and finding consistent with a perforated ulcer. This goes along with his history as he has been taking narcotics, and many NSAIDs. Patient has had surgical repair of his peptic ulcer and also another evacuation of infected hematoma by neurosurgery. For the last 2 weeks, patient's nutritional status has been poor and albumin is 1.6. Patient is fluid overloaded and neurosurgery has consulted IM for assistance in managing his fluid overload.   1. Fluid overload, bilateral peripheral edema: secondary to hypoalbuminemia, albumin 1.6, 3rd shifting and fluid resuscitation during multiple surgeries- improving now  - After starting Lasix and with albumin infusions, diuresis has significantly improved and -10 L out/negative balance on Metolazone+IV lasix - Some patients with hypoalbuminemia are relatively resistant to conventional diuretic therapy, his albumin was only 1.6, now improved. If correcting with nutrition, it will likely take some time to build up his protein and albumin stores.  - UA negative for proteinuria  - Nutrition consult done, continue ensure TID and continue prostat  - Doppler ultrasound of the lower extremities negative for any DVT  - leg edema resolving; changed to oral Lasix on 10/1+potassium, outpatient BMP in 1 week with PCP  2. Back pain s/p multiple surgeries,  osteomyelitis,  mrsa wound infection, s/p debridement, infected hematoma - management per primary team, neurosurgery  - Infectious disease following, on vancomycin and rifampin, last dose 11/4, ID followup in 3-4 weeks post hosp   3. Perforated gastric ulcer: Status post exploratory laparotomy and repair of perforated pyloric channel ulcer  - Tolerating oral diet, stable from surgical standpoint per Dr Corliss Skains; d/c NSAIDS 4. DM type 2 (diabetes mellitus, type 2): A1C 5.1;  - continue diet controlled  5. HTN, stable, changed to BB to control HR/BP; hold lisinopril  6. Anemia; likely anemia of chronic disease, normocytic  - Hemoglobin 8.3 after transfusion of 1 unit packed RBCs, start iron PO   - Follow FOBT: negative  7. Peripheral neuropathy, pain; trial of lyrica  8. L eye conjunctivitis; vision is intact; periorbital muscle movement is intact; try cipro eye drops; outpatient ophthalmology follow up 3-5 days     Code Status: full Family Communication:  D/w patient, nurse (indicate person spoken with, relationship, and if by phone, the number) Disposition Plan: per primary; likely in AM [facility] on 10/3   Consultants:    Procedures:    Antibiotics:   (indicate start date, and stop date if known)  HPI/Subjective: alert  Objective: Filed Vitals:   07/01/14 0559  BP: 119/79  Pulse: 11  Temp: 98.6 F (37 C)  Resp: 18    Intake/Output Summary (Last 24 hours) at 07/01/14 1101 Last data filed at 07/01/14 0559  Gross per 24 hour  Intake    730 ml  Output    550 ml  Net    180 ml   Filed Weights   06/10/14 0900 06/12/14 0400  Weight: 98.8  kg (217 lb 13 oz) 103.9 kg (229 lb 0.9 oz)    Exam:   General:  alert  Cardiovascular: s1,s2 rrr  Respiratory: CTA BL  Abdomen: soft, nt,nd   Musculoskeletal: no mild edema improved    Data Reviewed: Basic Metabolic Panel:  Recent Labs Lab 06/24/14 1500 06/25/14 0358 06/26/14 0500 06/27/14 0455 06/28/14 0610  06/29/14 0600 06/30/14 0520  NA 136* 139 140 139 137 133* 136*  K 3.8 3.7 3.7 3.9 3.5* 3.2* 3.6*  CL 100 101 102 101 93* 88* 92*  CO2 24 22 26 29  32 32 32  GLUCOSE 94 120* 92 90 92 104* 102*  BUN 9 9 10 12 11 13 14   CREATININE 0.82 0.66 0.72 0.77 0.82 0.86 0.89  CALCIUM 9.0 8.0* 8.1* 8.1* 8.4 8.5 8.7  MG 2.0  --   --   --   --   --   --   PHOS  --   --  4.2  --   --   --   --    Liver Function Tests:  Recent Labs Lab 06/26/14 0500 06/27/14 0455 06/29/14 0600  AST 9  --   --   ALT 6  --   --   ALKPHOS 126*  --   --   BILITOT <0.2*  --   --   PROT 7.1  --   --   ALBUMIN 1.6* 1.7* 2.3*   No results found for this basename: LIPASE, AMYLASE,  in the last 168 hours No results found for this basename: AMMONIA,  in the last 168 hours CBC:  Recent Labs Lab 06/26/14 0500 06/26/14 1755 06/27/14 0455 06/28/14 0610 06/29/14 0600 07/01/14 0540  WBC 12.1*  --  10.7* 9.4 11.7* 9.4  HGB 7.6* 8.3* 7.6* 8.3* 8.4* 7.9*  HCT 25.0* 26.7* 24.6* 26.3* 27.1* 25.7*  MCV 92.3  --  90.4 89.2 90.9 91.8  PLT 557*  --  497* 520* 533* 485*   Cardiac Enzymes: No results found for this basename: CKTOTAL, CKMB, CKMBINDEX, TROPONINI,  in the last 168 hours BNP (last 3 results) No results found for this basename: PROBNP,  in the last 8760 hours CBG: No results found for this basename: GLUCAP,  in the last 168 hours  No results found for this or any previous visit (from the past 240 hour(s)).   Studies: Dg Chest 2 View  06/30/2014   CLINICAL DATA:  Fever in weakness  EXAM: CHEST  2 VIEW  COMPARISON:  06/12/2014  FINDINGS: There is a right arm PICC line with tip in the right atrium. The heart size appears normal. There is a moderate left pleural effusion with overlying consent atelectasis or consolidation. The right lung appears clear.  IMPRESSION: 1. The right arm PICC line tip is in the right atrium. Recommend withdrawing by approximately 3 cm. 2. New moderate left pleural effusion.    Electronically Signed   By: Signa Kell M.D.   On: 06/30/2014 12:16    Scheduled Meds: . ciprofloxacin  2 drop Left Eye Q2H while awake  . dextrose  50 mL Intravenous Once  . feeding supplement (PRO-STAT SUGAR FREE 64)  30 mL Oral BID WC  . furosemide  20 mg Oral Daily  . metoprolol tartrate  25 mg Oral BID  . multivitamin with minerals  1 tablet Oral Daily  . pantoprazole  40 mg Oral BID  . potassium chloride  40 mEq Oral Daily  . pregabalin  50 mg Oral BID  .  rifampin  300 mg Oral Q12H  . sodium chloride  3 mL Intravenous Q12H  . vancomycin  1,000 mg Intravenous Q12H   Continuous Infusions: . sodium chloride 250 mL (06/20/14 16100607)    Active Problems:   Perforated gastric ulcer   Acute respiratory failure, unspecified whether with hypoxia or hypercapnia   Peritonitis (acute) generalized   Pain   AKI (acute kidney injury)   DM type 2 (diabetes mellitus, type 2)    Time spent: >35 minutes     Esperanza SheetsBURIEV, Parys Elenbaas N  [facility] Pager [phone]. If 7PM-7AM, please contact night-coverage at [email or web address], password [email or web address] 07/01/2014, 11:01 AM  LOS: 21 days

## 2014-07-01 NOTE — Progress Notes (Signed)
NUTRITION FOLLOW UP  Intervention:   -Continue Prostat liquid protein PO 30 ml BID with meals, each supplement provides 100 kcal, 15 g protein -Continue to encourage adequate PO intake  Nutrition Dx:   Inadequate oral intake related to altered GI function as evidenced by PO intake 85-95%, resolved.   Increased nutrient (protein) needs related to wound VAC as evidenced by estimated protein needs, ongoing   Goal:  Pt to meet >/= 90% of their estimated nutrition needs, meeting   Monitor:  PO & supplemental intake, weight, labs, I/O's  Assessment:   43 yo M who underwent lumbar fusion earlier in the summer and had several evacuations of hematomas. [facility], CT demonstrated free air and a perforated ulcer.  Patient with improved intake, about 100% of meals. He is receiving ProStat liquid protein BID.   Fluid overload improved with -10L diuresed.   Height: Ht Readings from Last 1 Encounters:  06/10/14 5\' 10"  (1.778 m)    Weight Status:   Wt Readings from Last 1 Encounters:  06/12/14 229 lb 0.9 oz (103.9 kg)    Re-estimated needs:  Kcal: 2100-2300  Protein: 120-130 gm  Fluid: 1.5 L/day  Skin: Abdominal wound VAC  Diet Order:     Intake/Output Summary (Last 24 hours) at 07/01/14 1544 Last data filed at 07/01/14 1419  Gross per 24 hour  Intake    740 ml  Output    650 ml  Net     90 ml    Last BM: 10/1   Labs:   Recent Labs Lab 06/25/14 0358 06/26/14 0500  06/28/14 0610 06/29/14 0600 06/30/14 0520  NA 139 140  < > 137 133* 136*  K 3.7 3.7  < > 3.5* 3.2* 3.6*  CL 101 102  < > 93* 88* 92*  CO2 22 26  < > 32 32 32  BUN 9 10  < > 11 13 14   CREATININE 0.66 0.72  < > 0.82 0.86 0.89  CALCIUM 8.0* 8.1*  < > 8.4 8.5 8.7  PHOS  --  4.2  --   --   --   --   GLUCOSE 120* 92  < > 92 104* 102*  < > = values in this interval not displayed.  CBG (last 3)  No results found for this basename: GLUCAP,  in the  last 72 hours  Scheduled Meds: . ciprofloxacin  2 drop Left Eye Q2H while awake  . feeding supplement (PRO-STAT SUGAR FREE 64)  30 mL Oral BID WC  . ferrous sulfate  325 mg Oral TID WC  . furosemide  20 mg Oral Daily  . metoprolol tartrate  25 mg Oral BID  . multivitamin with minerals  1 tablet Oral Daily  . pantoprazole  40 mg Oral BID  . potassium chloride  40 mEq Oral Daily  . pregabalin  50 mg Oral BID  . rifampin  300 mg Oral Q12H  . sodium chloride  3 mL Intravenous Q12H  . vancomycin  1,000 mg Intravenous Q12H    Continuous Infusions: . sodium chloride 250 mL (06/20/14 0607)    Linnell FullingElyse Taylan Marez, RD, LDN Pager #: [phone] After-Hours Pager #: [phone]

## 2014-07-01 NOTE — Progress Notes (Signed)
Patient ID: [name], male   DOB: 08/05/1971, 43 y.o.   MRN: [id number] Vital signs are stable. Motor function is improving steadily Leg edema is much improved Patient is severe it to be discharged tomorrow.

## 2014-07-02 DIAGNOSIS — K251 Acute gastric ulcer with perforation: Secondary | ICD-10-CM

## 2014-07-02 DIAGNOSIS — J9600 Acute respiratory failure, unspecified whether with hypoxia or hypercapnia: Secondary | ICD-10-CM

## 2014-07-02 LAB — HEMOGLOBIN AND HEMATOCRIT, BLOOD
HEMATOCRIT: 24.7 % — AB (ref 39.0–52.0)
HEMOGLOBIN: 7.6 g/dL — AB (ref 13.0–17.0)

## 2014-07-02 MED ORDER — OXYCODONE HCL ER 20 MG PO T12A: 20.0000 mg | EXTENDED_RELEASE_TABLET | Freq: Two times a day (BID) | ORAL | Status: DC

## 2014-07-02 MED ORDER — HEPARIN SOD (PORK) LOCK FLUSH 100 UNIT/ML IV SOLN
250.0000 [IU] | INTRAVENOUS | Status: AC | PRN
  Administered 2014-07-02: 250 [IU]

## 2014-07-02 NOTE — Discharge Summary (Signed)
Physician Discharge Summary  Devin [name] ZOX:[id number] DOB: 1970-12-02 DOA: 06/10/2014  PCP: No PCP Per Patient  Admit date: 06/10/2014 Discharge date: 07/02/2014  Time spent: >35 minutes  Recommendations for Outpatient Follow-up:  [facility] Discharge Diagnoses:  Active Problems:   Perforated gastric ulcer   Acute respiratory failure, unspecified whether with hypoxia or hypercapnia   Peritonitis (acute) generalized   Pain   AKI (acute kidney injury)   DM type 2 (diabetes mellitus, type 2)   Discharge Condition: stable   Diet recommendation: low sodium,  DM  Filed Weights   06/10/14 0900 06/12/14 0400  Weight: 98.8 kg (217 lb 13 oz) 103.9 kg (229 lb 0.9 oz)    History of present illness:  43 yo M who has been undergoing treatment by the neurosurgery team for back issues, underwent lumbar fusion earlier in the summer and had several evacuations of hematomas. He went to the hospital in [place] and was seen to have another fluid collection in his surgical site. He was transferred here to be admitted by them for treatment.  However, when the EDP evaluated him, he was found to be tachycardic and tender on his abdominal exam. CT was performed, and this demonstrated free air and finding consistent with a perforated ulcer. This goes along with his history as he has been taking narcotics, and many NSAIDs. Patient has had surgical repair of his peptic ulcer and also another evacuation of infected hematoma by neurosurgery. For the last 2 weeks, patient's nutritional status has been poor and albumin is 1.6. Patient is fluid overloaded and neurosurgery has consulted IM for assistance in managing his fluid overload.   Hospital Course:  1. Fluid overload, bilateral peripheral edema: secondary to hypoalbuminemia, albumin 1.6, 3rd shifting and fluid resuscitation during multiple surgeries- improved now  - After starting Lasix and with albumin infusions, diuresis has significantly improved and -10 L  out/negative balance on Metolazone+IV lasix  - Some patients with hypoalbuminemia are relatively resistant to conventional diuretic therapy, his albumin was only 1.6, now improved. If correcting with nutrition, it will likely take some time to build up his protein and albumin stores.  - UA negative for proteinuria  - Nutrition consult done, continue ensure TID and continue prostat  - Doppler ultrasound of the lower extremities negative for any DVT  - leg edema resolved; changed to oral Lasix on 10/1+potassium, outpatient BMP in 1 week with PCP   2. Back pain s/p multiple surgeries, osteomyelitis, mrsa wound infection, s/p debridement, infected hematoma  - management per primary team, neurosurgery  - Infectious disease following, on vancomycin and rifampin, last dose 11/4, ID followup in 3-4 weeks post hosp  3. Perforated gastric ulcer: Status post exploratory laparotomy and repair of perforated pyloric channel ulcer  - Tolerating oral diet, stable from surgical standpoint per Dr Corliss Skains; d/c NSAIDS  4. DM type 2 (diabetes mellitus, type 2): A1C 5.1;  - continue diet controlled  5. HTN, stable, changed to BB to control HR/BP; hold lisinopril  6. Anemia; likely anemia of chronic disease, normocytic  - Hemoglobin 8.3 after transfusion of 1 unit packed RBCs, start iron PO  - Follow FOBT: negative; no s/s of acute bleeding; cont PO iron supplement; recheck CBC in 3-5 days 7. Peripheral neuropathy, pain; trial of lyrica  -acute on chronic pain in the context of recent surgeries; cont current regimen, taper opioids as tolerated as outpatient  8. L eye conjunctivitis; vision is intact; periorbital muscle movement is intact; try cipro eye drops;  -  d/w patient; vision is intact; recommended to f/u with ophthalmology 10/5     Pt is being d/c home [facility]HC on Iv atx; f/u as above     Procedures:   (i.e. Studies not automatically included, echos, thoracentesis, etc; not  x-rays)  Consultations:  Neurosurgery, surgery ID  Discharge Exam: Filed Vitals:   07/02/14 0603  BP: 114/74  Pulse: 93  Temp: 98.6 F (37 C)  Resp: 20    General: alert Cardiovascular: s1,s2 rrr Respiratory: CTA BL  Discharge Instructions  Discharge Instructions   Diet - low sodium heart healthy    Complete by:  As directed      Discharge instructions    Complete by:  As directed   Please follow up with ophthalmology on 10/5 Please follow up with Infectious disease in 2-3 weeks Please follow up with [name] in 1 week     Increase activity slowly    Complete by:  As directed             Medication List    STOP taking these medications       lisinopril 20 MG tablet  Commonly known as:  PRINIVIL,ZESTRIL     naproxen 500 MG tablet  Commonly known as:  NAPROSYN      TAKE these medications       acetaminophen 500 MG tablet  Commonly known as:  TYLENOL  Take 2,000 mg by mouth every 6 (six) hours as needed for moderate pain or headache.     ciprofloxacin 0.3 % ophthalmic solution  Commonly known as:  CILOXAN  Place 2 drops into the left eye every 2 (two) hours while awake. Administer 1 drop, every 2 hours, while awake, for 2 days. Then 1 drop, every 4 hours, while awake, for the next 5 days.     diazepam 5 MG tablet  Commonly known as:  VALIUM  Take 1 tablet (5 mg total) by mouth every 6 (six) hours as needed for anxiety.     feeding supplement (PRO-STAT SUGAR FREE 64) Liqd  Take 30 mLs by mouth 2 (two) times daily with a meal.     ferrous sulfate 325 (65 FE) MG tablet  Take 1 tablet (325 mg total) by mouth 3 (three) times daily with meals.     furosemide 20 MG tablet  Commonly known as:  LASIX  Take 1 tablet (20 mg total) by mouth every other day.     metoprolol tartrate 25 MG tablet  Commonly known as:  LOPRESSOR  Take 1 tablet (25 mg total) by mouth 2 (two) times daily.     ondansetron 4 MG tablet  Commonly known as:  ZOFRAN  Take 1 tablet  (4 mg total) by mouth every 6 (six) hours as needed for nausea.     oxyCODONE 5 MG immediate release tablet  Commonly known as:  Oxy IR/ROXICODONE  Take 1 tablet (5 mg total) by mouth every 4 (four) hours as needed for moderate pain.     OxyCODONE 20 mg T12a 12 hr tablet  Commonly known as:  OXYCONTIN  Take 1 tablet (20 mg total) by mouth every 12 (twelve) hours.     pantoprazole 40 MG tablet  Commonly known as:  PROTONIX  Take 1 tablet (40 mg total) by mouth 2 (two) times daily.     PARoxetine 40 MG tablet  Commonly known as:  PAXIL  Take 1 tablet (40 mg total) by mouth every morning.     potassium chloride  10 MEQ tablet  Commonly known as:  K-DUR  Take 1 tablet (10 mEq total) by mouth every other day.     pregabalin 50 MG capsule  Commonly known as:  LYRICA  Take 1 capsule (50 mg total) by mouth 2 (two) times daily.     rifampin 300 MG capsule  Commonly known as:  RIFADIN  Take 1 capsule (300 mg total) by mouth every 12 (twelve) hours.     vancomycin 1 GM/200ML Soln  Commonly known as:  VANCOCIN  Inject 200 mLs (1,000 mg total) into the vein every 12 (twelve) hours.       Allergies  Allergen Reactions  . Codeine Other (See Comments)    Skin "crawl"  . Hydrocodone Other (See Comments)    Skin "crawl"  . Other Nausea Only and Other (See Comments)    Darvocet       Follow-up Information   Follow up with [facility], [facility]. Schedule an appointment as soon as possible for a visit in 3 weeks.   Specialty:  General Surgery   Contact information:   [address] [place] [address] [phone]        The results of significant diagnostics from this hospitalization (including imaging, microbiology, ancillary and laboratory) are listed below for reference.    Significant Diagnostic Studies: Dg Chest 2 View  06/30/2014   CLINICAL DATA:  Fever in weakness  EXAM: CHEST  2 VIEW  COMPARISON:  06/12/2014  FINDINGS: There is a right arm PICC line with  tip in the right atrium. The heart size appears normal. There is a moderate left pleural effusion with overlying consent atelectasis or consolidation. The right lung appears clear.  IMPRESSION: 1. The right arm PICC line tip is in the right atrium. Recommend withdrawing by approximately 3 cm. 2. New moderate left pleural effusion.   Electronically Signed   By: Signa Kell M.D.   On: 06/30/2014 12:16   Ct Lumbar Spine Wo Contrast  06/21/2014   CLINICAL DATA:  Debridement of lumbar spine  EXAM: CT LUMBAR SPINE WITHOUT CONTRAST  TECHNIQUE: Multidetector CT imaging of the lumbar spine was performed without intravenous contrast administration. Multiplanar CT image reconstructions were also generated.  COMPARISON:  None.  FINDINGS: The alignment is anatomic. There is no acute fracture or static listhesis. There is no spondylolysis.  There is posterior spinal fusion from T10 through L5. There are vertical stabilizing rods which are in satisfactory position. There is a chronic burst fracture of the T12 vertebral body. There are laminectomies from L2 through L5. There is interbody disc material from L2 through S1 without osseous incorporation. There has been interval removal of the bilateral sacral screws. There is air present within the surgical bed of the posterior elements from L2 through L5.  There are degenerative changes of bilateral SI joints.  IMPRESSION: 1. Interval removal of bilateral S1 hardware. 2. Extensive posterior spinal fusion from T10 through L5 with postsurgical changes in the posterior lumbar soft tissues.   Electronically Signed   By: Elige Ko   On: 06/21/2014 20:23   Ct Abdomen Pelvis W Contrast  06/10/2014   CLINICAL DATA:  Severe left lower quadrant pain.  EXAM: CT ABDOMEN AND PELVIS WITH CONTRAST  TECHNIQUE: Multidetector CT imaging of the abdomen and pelvis was performed using the standard protocol following bolus administration of intravenous contrast.  CONTRAST:  OMNIPAQUE  IOHEXOL 300 MG/ML  SOLN  COMPARISON:  MRI of the lumbar spine  May 26, 2014  FINDINGS: LUNG BASES: Included view of the lung bases demonstrate small layering left pleural effusion. Visualized heart and pericardium are unremarkable.  SOLID ORGANS: Layering density in the gallbladder suggests sludge. Liver, spleen, pancreas and adrenal glands are nonsuspicious .  GASTROINTESTINAL TRACT: Distal stomach/ pyloric wall thickening with superimposed perforated fusion posteriorly, with free air. The small and large bowel are normal in course and caliber without inflammatory changes. Colonic diverticulosis. Normal appendix.  KIDNEYS/ URINARY TRACT: Kidneys are orthotopic, demonstrating symmetric enhancement. No nephrolithiasis, hydronephrosis or solid renal masses. The unopacified ureters are normal in course and caliber. Delayed imaging through the kidneys demonstrates symmetric prompt contrast excretion within the proximal urinary collecting system. Urinary bladder is partially distended and unremarkable.  PERITONEUM/RETROPERITONEUM: Large amount of pneumoperitoneum anterior within the upper abdomen. Small a moderate amount of layering ascites without abscess. Aortoiliac vessels are normal in course and caliber, mild calcific atherosclerosis. No lymphadenopathy by CT size criteria. Internal reproductive organs are unremarkable.  SOFT TISSUE/OSSEOUS STRUCTURES: Status post T10 through S1 posterior instrumentation, at T12 chronic appearing burst fracture. L2 through L5 laminectomies with L2 through S1 interbody disc material, which does not appear incorporated. In addition, lucency about the S1 screws concerning for hardware failure possible infection. Peripherally calcified chronic appearing small lumbar paraspinal fluid collection without superimposed inflammatory changes.  IMPRESSION: Perforated gastric pyloric ulcer with moderate amount of pneumoperitoneum and small to moderate amount of ascites without abscess.   Colonic diverticulosis without convincing evidence of acute diverticulitis.  Extensive spinal surgery, with S1 screws periprosthetic lucency consistent with hardware failure.  Findings discussed with and reconfirmed by Dr.KEVIN CAMPOS on9/11/2015at2:45 am.   Electronically Signed   By: Awilda Metro   On: 06/10/2014 02:56   Dg Chest Port 1 View  06/12/2014   CLINICAL DATA:  Evaluate atelectasis.  EXAM: PORTABLE CHEST - 1 VIEW  COMPARISON:  Chest x-ray 06/11/2014.  FINDINGS: A nasogastric tube is seen extending into the stomach, however, the tip of the nasogastric tube extends below the lower margin of the image. Lung volumes are low. No consolidative airspace disease. No pleural effusions. Improved aeration at the left base, compatible with resolution of atelectasis noted on the prior examination. No evidence of pulmonary edema. Heart size is normal. Upper mediastinal contours are within normal limits. Multiple old healed right-sided rib fractures are incidentally noted.  IMPRESSION: 1. Support apparatus, as above. 2. Low lung volumes with improving bibasilar aeration, particularly in the left.   Electronically Signed   By: Trudie Reed M.D.   On: 06/12/2014 08:37   Dg Chest Port 1 View  06/11/2014   CLINICAL DATA:  Respiratory failure  EXAM: PORTABLE CHEST - 1 VIEW  COMPARISON:  05/31/2014  FINDINGS: Very limited inspiratory effect. Endotracheal tube and NG tube unchanged. Right lung is clear. Retrocardiac left lower lobe opacity is increased when compared to prior study.  IMPRESSION: Increased retrocardiac left lower lobe opacification consistent with lower lobe consolidation.   Electronically Signed   By: Esperanza Heir M.D.   On: 06/11/2014 08:24   Dg Chest Port 1 View  06/10/2014   CLINICAL DATA:  Check endotracheal tube placement  EXAM: PORTABLE CHEST - 1 VIEW  COMPARISON:  04/19/2014  FINDINGS: Cardiac shadow is mildly prominent. A nasogastric catheter is seen within the stomach. An  endotracheal tube is noted with the tip 3.4 cm above the carina. The lungs are clear. No acute bony abnormality is noted. Postsurgical changes in the thoracic and lumbar spine are  seen.  IMPRESSION: Endotracheal tube and nasogastric catheter in satisfactory position. No acute abnormality noted.  These results will be called to the ordering clinician or representative by the Radiologist Assistant, and communication documented in the PACS or zVision Dashboard.   Electronically Signed   By: Alcide CleverMark  Lukens M.D.   On: 06/10/2014 09:38   Dg Kayleen MemosUgi W/water Sol Cm  06/14/2014   CLINICAL DATA:  Gastric perforation status post repair  EXAM: WATER SOLUBLE UPPER GI SERIES  TECHNIQUE: Single-column upper GI series was performed using water soluble contrast.  CONTRAST:  Water-soluble contrast.  150 cc Omnipaque  COMPARISON:  CT 06/10/2014  FLUOROSCOPY TIME:  1 min 7 seconds  FINDINGS: Water-soluble contrast was hand injected through the nasogastric tube. Contrast fills the gastric fundus and body without evidence of extraluminal leakage of the contrast material. Contrast flows into the proximal small bowel.  IMPRESSION: No evidence of leakage of the contrast with  filling of the stomach.   Electronically Signed   By: Genevive BiStewart  Edmunds M.D.   On: 06/14/2014 15:35    Microbiology: No results found for this or any previous visit (from the past 240 hour(s)).   Labs: Basic Metabolic Panel:  Recent Labs Lab 06/26/14 0500 06/27/14 0455 06/28/14 0610 06/29/14 0600 06/30/14 0520  NA 140 139 137 133* 136*  K 3.7 3.9 3.5* 3.2* 3.6*  CL 102 101 93* 88* 92*  CO2 26 29 32 32 32  GLUCOSE 92 90 92 104* 102*  BUN 10 12 11 13 14   CREATININE 0.72 0.77 0.82 0.86 0.89  CALCIUM 8.1* 8.1* 8.4 8.5 8.7  PHOS 4.2  --   --   --   --    Liver Function Tests:  Recent Labs Lab 06/26/14 0500 06/27/14 0455 06/29/14 0600  AST 9  --   --   ALT 6  --   --   ALKPHOS 126*  --   --   BILITOT <0.2*  --   --   PROT 7.1  --   --    ALBUMIN 1.6* 1.7* 2.3*   No results found for this basename: LIPASE, AMYLASE,  in the last 168 hours No results found for this basename: AMMONIA,  in the last 168 hours CBC:  Recent Labs Lab 06/26/14 0500  06/27/14 0455 06/28/14 0610 06/29/14 0600 07/01/14 0540 07/02/14 0545  WBC 12.1*  --  10.7* 9.4 11.7* 9.4  --   HGB 7.6*  < > 7.6* 8.3* 8.4* 7.9* 7.6*  HCT 25.0*  < > 24.6* 26.3* 27.1* 25.7* 24.7*  MCV 92.3  --  90.4 89.2 90.9 91.8  --   PLT 557*  --  497* 520* 533* 485*  --   < > = values in this interval not displayed. Cardiac Enzymes: No results found for this basename: CKTOTAL, CKMB, CKMBINDEX, TROPONINI,  in the last 168 hours BNP: BNP (last 3 results) No results found for this basename: PROBNP,  in the last 8760 hours CBG: No results found for this basename: GLUCAP,  in the last 168 hours     Signed:  Esperanza SheetsBURIEV, Fran Mcree N  [facility] 07/02/2014, 10:43 AM

## 2014-07-02 NOTE — Progress Notes (Signed)
15 Days Post-Op  Subjective: No complaints  Objective: Vital signs in last 24 hours: Temp:  [98.6 F (37 C)-100 F (37.8 C)] 98.6 F (37 C) (10/03 0603) Pulse Rate:  [93-106] 93 (10/03 0603) Resp:  [17-20] 20 (10/03 0603) BP: (114-124)/(72-83) 114/74 mmHg (10/03 0603) SpO2:  [98 %-99 %] 98 % (10/03 0603) Last BM Date: 06/30/14  Intake/Output from previous day: 10/02 0701 - 10/03 0700 In: 1750 [P.O.:1540; I.V.:10; IV Piggyback:200] Out: 950 [Urine:950] Intake/Output this shift:    Incision/Wound:abdominal wound clean.  Tolerating diet  Lab Results:   Recent Labs  07/01/14 0540 07/02/14 0545  WBC 9.4  --   HGB 7.9* 7.6*  HCT 25.7* 24.7*  PLT 485*  --    BMET  Recent Labs  06/30/14 0520  NA 136*  K 3.6*  CL 92*  CO2 32  GLUCOSE 102*  BUN 14  CREATININE 0.89  CALCIUM 8.7   PT/INR No results found for this basename: LABPROT, INR,  in the last 72 hours ABG No results found for this basename: PHART, PCO2, PO2, HCO3,  in the last 72 hours  Studies/Results: Dg Chest 2 View  06/30/2014   CLINICAL DATA:  Fever in weakness  EXAM: CHEST  2 VIEW  COMPARISON:  06/12/2014  FINDINGS: There is a right arm PICC line with tip in the right atrium. The heart size appears normal. There is a moderate left pleural effusion with overlying consent atelectasis or consolidation. The right lung appears clear.  IMPRESSION: 1. The right arm PICC line tip is in the right atrium. Recommend withdrawing by approximately 3 cm. 2. New moderate left pleural effusion.   Electronically Signed   By: Signa Kell M.D.   On: 06/30/2014 12:16    Anti-infectives: Anti-infectives   Start     Dose/Rate Route Frequency Ordered Stop   07/01/14 0000  rifampin (RIFADIN) 300 MG capsule     300 mg Oral Every 12 hours 07/01/14 1109 08/03/14 2359   07/01/14 0000  vancomycin (VANCOCIN) 1 GM/200ML SOLN    Comments:  Please supply until 11/4;   1,000 mg 200 mL/hr over 60 Minutes Intravenous Every 12 hours  07/01/14 1109 08/03/14 2359   06/28/14 0100  vancomycin (VANCOCIN) IVPB 1000 mg/200 mL premix     1,000 mg 200 mL/hr over 60 Minutes Intravenous Every 12 hours 06/27/14 1247     06/27/14 2200  rifampin (RIFADIN) capsule 300 mg     300 mg Oral Every 12 hours 06/27/14 1332     06/22/14 0000  vancomycin (VANCOCIN) 1,250 mg in sodium chloride 0.9 % 250 mL IVPB  Status:  Discontinued     1,250 mg 166.7 mL/hr over 90 Minutes Intravenous Every 12 hours 06/21/14 1706 06/27/14 1246   06/21/14 1700  rifampin (RIFADIN) capsule 300 mg  Status:  Discontinued     300 mg Oral Daily 06/21/14 1557 06/27/14 1332   06/19/14 0300  vancomycin (VANCOCIN) IVPB 1000 mg/200 mL premix  Status:  Discontinued     1,000 mg 200 mL/hr over 60 Minutes Intravenous Every 12 hours 06/19/14 0231 06/21/14 1706   06/19/14 0200  vancomycin (VANCOCIN) 500 mg in sodium chloride 0.9 % 100 mL IVPB  Status:  Discontinued     500 mg 100 mL/hr over 60 Minutes Intravenous Every 12 hours 06/18/14 1335 06/19/14 0231   06/18/14 1330  vancomycin (VANCOCIN) 2,000 mg in sodium chloride 0.9 % 500 mL IVPB     2,000 mg 250 mL/hr over 120 Minutes  Intravenous  Once 06/18/14 1319 06/18/14 1602   06/17/14 1608  bacitracin 50,000 Units in sodium chloride irrigation 0.9 % 500 mL irrigation  Status:  Discontinued       As needed 06/17/14 1608 06/17/14 1708   06/11/14 0900  fluconazole (DIFLUCAN) IVPB 200 mg     200 mg 100 mL/hr over 60 Minutes Intravenous Every 24 hours 06/11/14 0832 06/17/14 1100   06/10/14 1900  vancomycin (VANCOCIN) 1,250 mg in sodium chloride 0.9 % 250 mL IVPB  Status:  Discontinued     1,250 mg 166.7 mL/hr over 90 Minutes Intravenous Every 8 hours 06/10/14 0927 06/12/14 0954   06/10/14 1700  piperacillin-tazobactam (ZOSYN) IVPB 3.375 g  Status:  Discontinued     3.375 g 12.5 mL/hr over 240 Minutes Intravenous Every 8 hours 06/10/14 0927 06/20/14 1050   06/10/14 1100  vancomycin (VANCOCIN) 1,750 mg in sodium chloride 0.9 % 500  mL IVPB     1,750 mg 250 mL/hr over 120 Minutes Intravenous  Once 06/10/14 0927 06/10/14 1256   06/10/14 1030  piperacillin-tazobactam (ZOSYN) IVPB 3.375 g     3.375 g 100 mL/hr over 30 Minutes Intravenous  Once 06/10/14 0927 06/10/14 1030   06/10/14 0300  piperacillin-tazobactam (ZOSYN) IVPB 3.375 g     3.375 g 12.5 mL/hr over 240 Minutes Intravenous  Once 06/10/14 0249 06/10/14 0444      Assessment/Plan: s/p Procedure(s): Lumbar Wound Debridement with removal of hardware. (N/A) Abdominal wound clean   Will sign off  LOS: 22 days    Henchy Mccauley A. 07/02/2014

## 2014-07-02 NOTE — Progress Notes (Signed)
OT Cancellation Note  Patient Details Name: Devin [name] MRN: [id number] DOB: 07/23/1971   Cancelled Treatment:    Reason Eval/Treat Not Completed: Other (comment), pt. Declines stating he is scheduled for d/c today and has "no real questions or need for therapy at this time"  Devin [name], Devin [name], COTA/L 07/02/2014, 12:11 PM

## 2014-07-03 ENCOUNTER — Telehealth: Payer: Self-pay | Admitting: Infectious Diseases

## 2014-07-03 NOTE — Telephone Encounter (Signed)
Pt cannot afford rifampin Will see him in clinic

## 2014-07-14 ENCOUNTER — Telehealth: Payer: Self-pay | Admitting: *Deleted

## 2014-07-14 NOTE — Telephone Encounter (Signed)
Vanc trough = 21.7 on 07/12/14.  [facility]  They have not been able to speak with patient (has no working phone number) to adjust his vancomycin. She attempted to call the neighbor (has instructions to call neighbor and leave message asking for patient to contact her), but had to leave message.  Patient's vancomycin was dropped to 750mg  Q12 hours.  Labs will be redrawn next week.   Additionally, Samule OhmKim Marley, [facility], unable to contact patient to schedule hospital follow up.  Will pass along neighbor's phone number, ask to leave message having patient call back for an appointment. [phone] (unnamed neighbor, per [facility]) Andree CossHowell, Loeta Herst M, RN

## 2014-08-03 ENCOUNTER — Telehealth: Payer: Self-pay | Admitting: *Deleted

## 2014-08-03 NOTE — Telephone Encounter (Signed)
Patient completed his IV vancomycin today, 08/03/14.  His last nurse visit will be tomorrow, where the home health nurse will do his discharge and pull the PICC.  Shanda BumpsJessica, home health RN is calling for orders to pull PICC.  Patient is coming tomorrow morning for his hospital follow up with Dr. Drue SecondSnider.  This RN advised Shanda BumpsJessica that the order to pull PICC or extend IV antibiotics would be determined tomorrow at the visit.  She will hold off on the home visit until she hears from us. If he needs to continue with home health, patient will need new orders faxed to pharmacy per Shanda BumpsJessica. Andree CossHowell, Orian Figueira M, RN

## 2014-08-04 ENCOUNTER — Ambulatory Visit (INDEPENDENT_AMBULATORY_CARE_PROVIDER_SITE_OTHER): Payer: BC Managed Care – PPO | Admitting: Internal Medicine

## 2014-08-04 ENCOUNTER — Encounter: Payer: Self-pay | Admitting: Internal Medicine

## 2014-08-04 ENCOUNTER — Telehealth: Payer: Self-pay | Admitting: Licensed Clinical Social Worker

## 2014-08-04 ENCOUNTER — Telehealth: Payer: Self-pay | Admitting: *Deleted

## 2014-08-04 VITALS — Wt 202.5 lb

## 2014-08-04 DIAGNOSIS — M25571 Pain in right ankle and joints of right foot: Secondary | ICD-10-CM | POA: Diagnosis not present

## 2014-08-04 DIAGNOSIS — R110 Nausea: Secondary | ICD-10-CM

## 2014-08-04 DIAGNOSIS — M4626 Osteomyelitis of vertebra, lumbar region: Secondary | ICD-10-CM

## 2014-08-04 LAB — COMPLETE METABOLIC PANEL WITH GFR
ALK PHOS: 143 U/L — AB (ref 39–117)
ALT: 8 U/L (ref 0–53)
AST: 16 U/L (ref 0–37)
Albumin: 3.2 g/dL — ABNORMAL LOW (ref 3.5–5.2)
BILIRUBIN TOTAL: 0.3 mg/dL (ref 0.2–1.2)
BUN: 14 mg/dL (ref 6–23)
CO2: 21 mEq/L (ref 19–32)
CREATININE: 0.58 mg/dL (ref 0.50–1.35)
Calcium: 9.4 mg/dL (ref 8.4–10.5)
Chloride: 107 mEq/L (ref 96–112)
GFR, Est African American: >89 mL/min
GLUCOSE: 133 mg/dL — AB (ref 70–99)
Potassium: 4 mEq/L (ref 3.5–5.3)
Sodium: 138 mEq/L (ref 135–145)
Total Protein: 7.1 g/dL (ref 6.0–8.3)

## 2014-08-04 LAB — C-REACTIVE PROTEIN: CRP: 0.5 mg/dL (ref <0.60)

## 2014-08-04 MED ORDER — RIFAMPIN 300 MG PO CAPS: 300.0000 mg | ORAL_CAPSULE | Freq: Two times a day (BID) | ORAL | Status: AC

## 2014-08-04 MED ORDER — DOXYCYCLINE HYCLATE 100 MG PO TABS
100.0000 mg | ORAL_TABLET | Freq: Two times a day (BID) | ORAL | Status: DC
Start: 2014-08-04 — End: 2014-08-04

## 2014-08-04 MED ORDER — ONDANSETRON HCL 4 MG PO TABS: 4.0000 mg | ORAL_TABLET | Freq: Three times a day (TID) | ORAL | Status: DC | PRN

## 2014-08-04 MED ORDER — GABAPENTIN 300 MG PO CAPS: 300.0000 mg | ORAL_CAPSULE | Freq: Three times a day (TID) | ORAL | Status: AC

## 2014-08-04 MED ORDER — DOXYCYCLINE HYCLATE 100 MG PO TABS: 100.0000 mg | ORAL_TABLET | Freq: Two times a day (BID) | ORAL | Status: DC

## 2014-08-04 NOTE — Telephone Encounter (Signed)
[facility] called wanting to know if she could get a phone once the patient's labs come back so she will know if the PICC line is to be pulled or extended. If extended she will need a new order.

## 2014-08-04 NOTE — Telephone Encounter (Signed)
[facility].  [facility] verbalized understanding.

## 2014-08-04 NOTE — Progress Notes (Signed)
RFV: hfu for MRSA SSI related to T10-S1 fusion Subjective:    Patient ID: Devin [name], male    DOB: 01/04/1971, 43 y.o.   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 He underwent T10-S1 fusion which was complicated by developing a wound hematoma 3 wks post surgery on on 8/12 in setting of worsening back pain, s/p decompression on 8/17, cx were negative.It subsequently recurred, requiring 2nd deompression. ON repeat MRI fluid collection recurred but appeared small and no signs of infection. He was discharged home off of antibiotics. He was readmitted on 9/11 for acute bdominal pain which was due perforated gi ulcer requiring ex lap. At that time also had CT scan of the abdomen and pelvis the demonstrated a persistent fluid collection and loosening of his S1 pedicle screws. Thus on 9/15, he underwent debridement of his wound, plus Removal of the S1 pedicle screws. OR cx now growing MRSA (S vanco, Doxy, bactrim ,rif). He was placed on vancomycin and rifampin for a period of 42 days using 9/16 as day 1. He finished last dose on 11/4. Blood owkr from 10/28 shows  Wbc 9.8. plt 536. vanco trough 9.8. He states that the antibiotics have been difficult for him causing him to feel poorly, by taking rifampin on full stomach, poor appetite, and loose stools. He also has persistent burning pain to 2-3-4th toe area of dorsum of foot that causes him difficulty staying asleep. Takes opiates to help with pain.  Current Outpatient Prescriptions on File Prior to Visit  Medication Sig Dispense Refill  . acetaminophen (TYLENOL) 500 MG tablet Take 2,000 mg by mouth every 6 (six) hours as needed for moderate pain or headache.     . Amino Acids-Protein Hydrolys (FEEDING SUPPLEMENT, PRO-STAT SUGAR FREE 64,) LIQD Take 30 mLs by mouth 2 (two) times daily with a meal. 900 mL 0  . ciprofloxacin (CILOXAN) 0.3 % ophthalmic solution  Place 2 drops into the left eye every 2 (two) hours while awake. Administer 1 drop, every 2 hours, while awake, for 2 days. Then 1 drop, every 4 hours, while awake, for the next 5 days. 5 mL 0  . diazepam (VALIUM) 5 MG tablet Take 1 tablet (5 mg total) by mouth every 6 (six) hours as needed for anxiety. 30 tablet 0  . ferrous sulfate 325 (65 FE) MG tablet Take 1 tablet (325 mg total) by mouth 3 (three) times daily with meals. 90 tablet 3  . furosemide (LASIX) 20 MG tablet Take 1 tablet (20 mg total) by mouth every other day. 20 tablet 0  . metoprolol tartrate (LOPRESSOR) 25 MG tablet Take 1 tablet (25 mg total) by mouth 2 (two) times daily. 60 tablet 0  . oxyCODONE (OXY IR/ROXICODONE) 5 MG immediate release tablet Take 1 tablet (5 mg total) by mouth every 4 (four) hours as needed for moderate pain. 60 tablet 0  . OxyCODONE (OXYCONTIN) 20 mg T12A 12 hr tablet Take 1 tablet (20 mg total) by mouth every 12 (twelve) hours. 60 tablet 0  . pantoprazole (PROTONIX) 40 MG tablet Take 1 tablet (40 mg total) by mouth 2 (two) times daily. 60 tablet 0  . PARoxetine (PAXIL) 40 MG tablet Take 1 tablet (40 mg total) by mouth every morning. 30 tablet 0  . potassium chloride (K-DUR) 10 MEQ tablet Take 1 tablet (10 mEq total) by mouth every other day. 20 tablet 0  .  pregabalin (LYRICA) 50 MG capsule Take 1 capsule (50 mg total) by mouth 2 (two) times daily. 60 capsule 0   No current facility-administered medications on file prior to visit.   Active Ambulatory Problems    Diagnosis Date Noted  . Spondylosis of lumbosacral region 04/19/2014  . Spondylosis of lumbar joint 04/19/2014  . Postoperative hematoma 05/11/2014  . Fluid collection at surgical site 05/23/2014  . Perforated gastric ulcer 06/10/2014  . Acute respiratory failure, unspecified whether with hypoxia or hypercapnia 06/10/2014  . Peritonitis (acute) generalized 06/10/2014  . Pain 06/10/2014  . AKI (acute kidney injury) 06/10/2014  . DM type 2  (diabetes mellitus, type 2) 06/10/2014   Resolved Ambulatory Problems    Diagnosis Date Noted  . No Resolved Ambulatory Problems   Past Medical History  Diagnosis Date  . Diabetes mellitus without complication   . Hypertension   . Anxiety     Review of Systems     Objective:   Physical Exam Wt 202 lb 8 oz (91.853 kg) Physical Exam  Constitutional: He is oriented to person, place, and time. He appears well-developed and well-nourished. No distress.  HENT:  Mouth/Throat: Oropharynx is clear and moist. No oropharyngeal exudate.  Cardiovascular: Normal rate, regular rhythm and normal heart sounds. Exam reveals no gallop and no friction rub.  No murmur heard.  Pulmonary/Chest: Effort normal and breath sounds normal. No respiratory distress. He has no wheezes.  Lymphadenopathy:  He has no cervical adenopathy.  Neurological: He is alert and oriented to person, place, and time.  Skin: multiple tattoos to upper arms. No rash to picc line. C/d/i Ext: no edema to lower extremities. Right dorsum of foot tender to touch at dorsum of foot near 2nd, 34d, 4th toe. TTP, light palpation. No warmth ,erythema or swelling Psychiatric: He has a normal mood and affect. His behavior is normal.   Lab Results  Component Value Date   ESRSEDRATE 119* 06/25/2014   Lab Results  Component Value Date   CRP 19.6* 06/25/2014        Assessment & Plan:  Post operative MRSA infection of hematoma from lumbar fusion- will check sed rate and crp to see if need to extend iv antibiotics by addn 2 wk.concern that his plt are in the 500s and that he was subtherapeutic with vanco trough of 9.8 last week. He may need 2-3 wk of continued vancomycin but goal of 15-20 on vanco trough, then convert to doxycycline plus rifampin - will give him zofran to help with nausea SE of rifampin. Instructed to take rif on full stomach to help with side effects - will check cmp to see if any transaminitis associated with  rifampin  - peripheral neuropathy = will do a trial of neurontin. Can stop if he notices lower extremity edema or excess sedation or that it is ineffective  rtc in 4-6 wk  -

## 2014-08-05 LAB — SEDIMENTATION RATE: Sed Rate: 61 mm/hr — ABNORMAL HIGH (ref 0–16)

## 2014-08-08 NOTE — Telephone Encounter (Signed)
Gave verbal order to patient's home health nurse and to [facility] ([facility]). Andree CossHowell, Ryli Standlee M, RN

## 2014-08-08 NOTE — Telephone Encounter (Signed)
Extend antibiotics for addn 2 wks as discussed

## 2014-08-09 ENCOUNTER — Inpatient Hospital Stay: Payer: BC Managed Care – PPO | Admitting: Internal Medicine

## 2014-08-11 ENCOUNTER — Telehealth: Payer: Self-pay | Admitting: *Deleted

## 2014-08-11 NOTE — Telephone Encounter (Signed)
Order faxed and Debbie at [facility] notified to check a new vanc trough before 4th dose after latest dosage and a BMP. Devin [name] CMA

## 2014-08-26 ENCOUNTER — Other Ambulatory Visit: Payer: Self-pay | Admitting: Internal Medicine

## 2014-09-15 ENCOUNTER — Ambulatory Visit (INDEPENDENT_AMBULATORY_CARE_PROVIDER_SITE_OTHER): Payer: Self-pay | Admitting: Infectious Diseases

## 2014-09-15 ENCOUNTER — Encounter: Payer: Self-pay | Admitting: Infectious Diseases

## 2014-09-15 VITALS — BP 152/94 | HR 98 | Temp 98.0°F | Wt 206.0 lb

## 2014-09-15 DIAGNOSIS — M4627 Osteomyelitis of vertebra, lumbosacral region: Secondary | ICD-10-CM

## 2014-09-15 DIAGNOSIS — N179 Acute kidney failure, unspecified: Secondary | ICD-10-CM

## 2014-09-15 MED ORDER — CYCLOBENZAPRINE HCL 10 MG PO TABS: 10.0000 mg | ORAL_TABLET | Freq: Three times a day (TID) | ORAL | Status: AC | PRN

## 2014-09-15 MED ORDER — SULFAMETHOXAZOLE-TRIMETHOPRIM 800-160 MG PO TABS: 1.0000 | ORAL_TABLET | Freq: Two times a day (BID) | ORAL | Status: AC

## 2014-09-15 NOTE — Assessment & Plan Note (Signed)
Last Cr's have been normal. Will watch.

## 2014-09-15 NOTE — Addendum Note (Signed)
Addended by: Huel Centola C on: 09/15/2014 04:13 PM   Modules accepted: Orders

## 2014-09-15 NOTE — Assessment & Plan Note (Signed)
Will change him to bactrim. His most recent Cr have been normal. Will give him this (due to vision changes seen with docy/rifampin) and see if he can complete 2 more months of rx. Will repeat his CRP and ESR at f/u

## 2014-09-15 NOTE — Progress Notes (Signed)
   Subjective:    Patient ID: Devin [name], male    DOB: 02/07/1971, 43 y.o.   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 He underwent T10-S1 fusion which was complicated by developing a wound hematoma 3 wks post surgery on on 8/12 in setting of worsening back pain, s/p decompression on 8/17, cx were negative. It subsequently recurred, requiring 2nd deompression. Repeat MRI: fluid collection recurred but appeared small and no signs of infection. He was d/c home off anbx. He was readmitted on 9/11 for acute bdominal pain which was due perforated gi ulcer requiring ex lap. At that time also had CT scan of the abdomen and pelvis the demonstrated a persistent fluid collection and loosening of his S1 pedicle screws. On 9/15, he underwent debridement of his wound, plus removal of the S1 pedicle screws. OR cx grew MRSA (S vanco, Doxy, bactrim ,rif). He was placed on vancomycin and rifampin for 42 days using 9/16 as day 1. He finished last dose on 11/4. He was seen in ID 11-5 and was extended to 2 more weeks of IV and then was started on doxy/rifampin. He quit taking these (12-10) due to side effects (nausea, vision changes).  Sfx have resolved since stopping. Still has some back pain.  He denies f/c. Has numbness in his R foot since his September.   Review of Systems  Constitutional: Positive for unexpected weight change. Negative for fever, chills and appetite change.  Gastrointestinal: Negative for diarrhea and constipation.  Genitourinary: Negative for difficulty urinating.       Objective:   Physical Exam  Constitutional: He appears well-developed and well-nourished.  HENT:  Mouth/Throat: No oropharyngeal exudate.  Eyes: EOM are normal. Pupils are equal, round, and reactive to light.  Neck: Neck supple.  Cardiovascular: Normal rate, regular rhythm and normal heart sounds.   Pulmonary/Chest: Effort normal  and breath sounds normal.  Abdominal: Soft. Bowel sounds are normal.  Musculoskeletal:       Arms: Lymphadenopathy:    He has no cervical adenopathy.          Assessment & Plan:

## 2014-09-16 ENCOUNTER — Encounter: Payer: Self-pay | Admitting: Infectious Diseases

## 2014-09-21 ENCOUNTER — Encounter: Payer: Self-pay | Admitting: Infectious Diseases

## 2014-09-26 ENCOUNTER — Encounter: Payer: Self-pay | Admitting: Infectious Diseases

## 2014-09-27 ENCOUNTER — Encounter: Payer: Self-pay | Admitting: Infectious Diseases

## 2014-09-28 ENCOUNTER — Encounter: Payer: Self-pay | Admitting: Infectious Diseases

## 2014-10-08 ENCOUNTER — Other Ambulatory Visit: Payer: Self-pay | Admitting: Internal Medicine

## 2014-10-08 MED ORDER — ONDANSETRON HCL 4 MG PO TABS: 4.0000 mg | ORAL_TABLET | Freq: Three times a day (TID) | ORAL | Status: AC | PRN

## 2014-11-16 ENCOUNTER — Ambulatory Visit: Payer: BC Managed Care – PPO | Admitting: Infectious Diseases

## 2014-11-24 ENCOUNTER — Other Ambulatory Visit: Payer: Self-pay | Admitting: Neurological Surgery

## 2014-11-24 DIAGNOSIS — M545 Low back pain: Secondary | ICD-10-CM

## 2014-12-01 ENCOUNTER — Ambulatory Visit
Admission: RE | Admit: 2014-12-01 | Discharge: 2014-12-01 | Disposition: A | Discharge status: Home / Self Care | Payer: BLUE CROSS/BLUE SHIELD | Source: Ambulatory Visit | Attending: Neurological Surgery | Admitting: Neurological Surgery

## 2014-12-01 DIAGNOSIS — M545 Low back pain: Secondary | ICD-10-CM

## 2015-07-19 ENCOUNTER — Other Ambulatory Visit: Payer: Self-pay | Admitting: Infectious Diseases

## 2015-09-18 ENCOUNTER — Other Ambulatory Visit: Payer: Self-pay | Admitting: Neurological Surgery

## 2015-09-18 DIAGNOSIS — S32009K Unspecified fracture of unspecified lumbar vertebra, subsequent encounter for fracture with nonunion: Secondary | ICD-10-CM

## 2015-10-04 ENCOUNTER — Inpatient Hospital Stay: Admission: RE | Admit: 2015-10-04 | Payer: BLUE CROSS/BLUE SHIELD | Source: Ambulatory Visit

## 2015-10-05 ENCOUNTER — Ambulatory Visit
Admission: RE | Admit: 2015-10-05 | Discharge: 2015-10-05 | Disposition: A | Discharge status: Home / Self Care | Payer: BLUE CROSS/BLUE SHIELD | Source: Ambulatory Visit | Attending: Neurological Surgery | Admitting: Neurological Surgery

## 2015-10-05 DIAGNOSIS — S32009K Unspecified fracture of unspecified lumbar vertebra, subsequent encounter for fracture with nonunion: Secondary | ICD-10-CM

## 2015-11-09 IMAGING — CT CT L SPINE W/O CM
4 of 10 series · 11 of 33 positions shown, 13 images · non-contrast
Comparison: Radiographs dated 08/31/2014 and CT scan dated
06/21/2014

CLINICAL DATA: Chronic low back pain. Left leg pain with weakness
and numbness. 6 prior lumbar surgeries. Fusion from T10 and S1.

EXAM:
CT LUMBAR SPINE WITHOUT CONTRAST
TECHNIQUE: Multidetector CT imaging of the lumbar spine was performed without
intravenous contrast administration. Multiplanar CT image
reconstructions were also generated.

[Series 4: l spine bone · axial · 0.27mm/px · z∈[-198,-113]mm · 2 of 103 slices shown]
[im 35/103  bone]
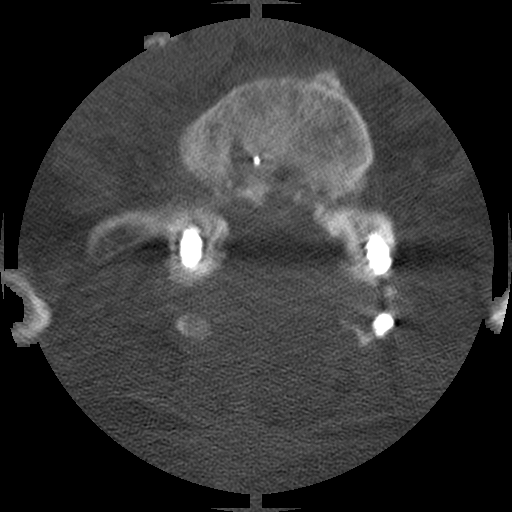
[im 69/103  bone]
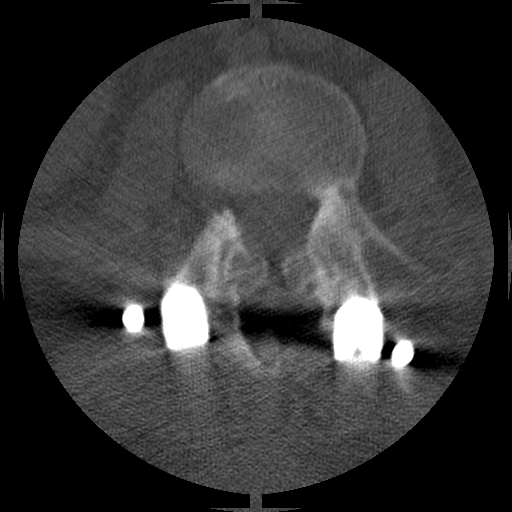

[Series 5: l spine detail · axial · 0.27mm/px · z∈[-218,-93]mm · 3 of 102 slices shown, 4 images]
[im 26/102  soft-tissue]
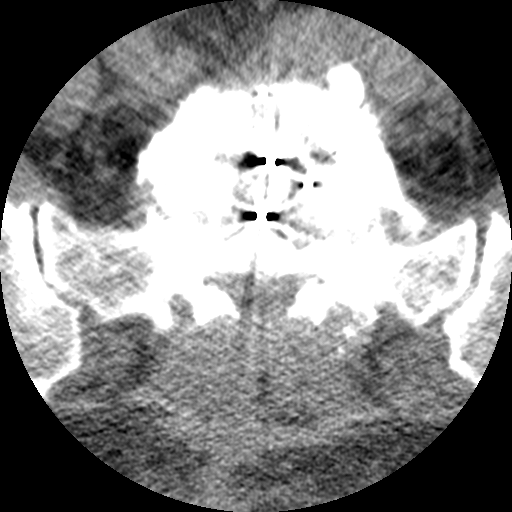
[im 26/102  bone]
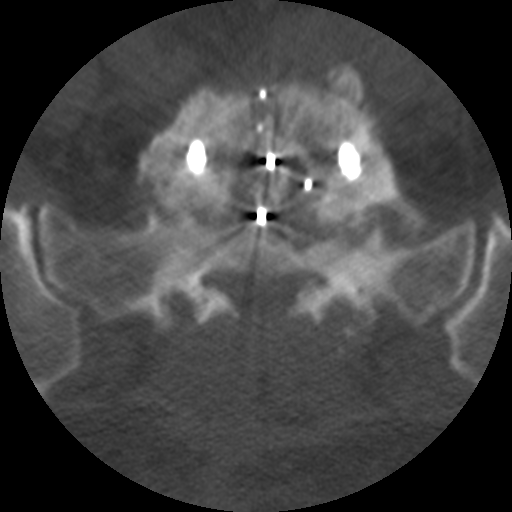
[im 51/102  bone]
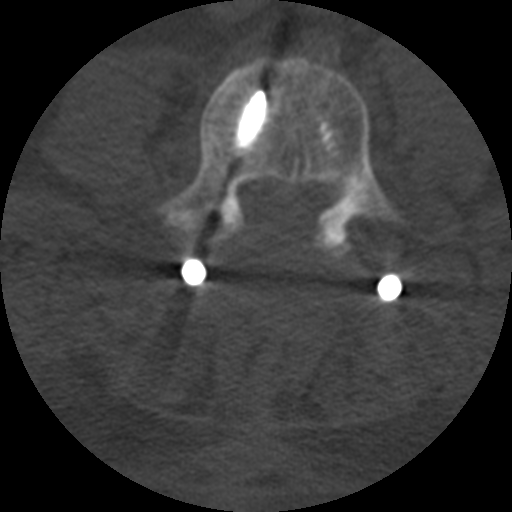
[im 76/102  bone]
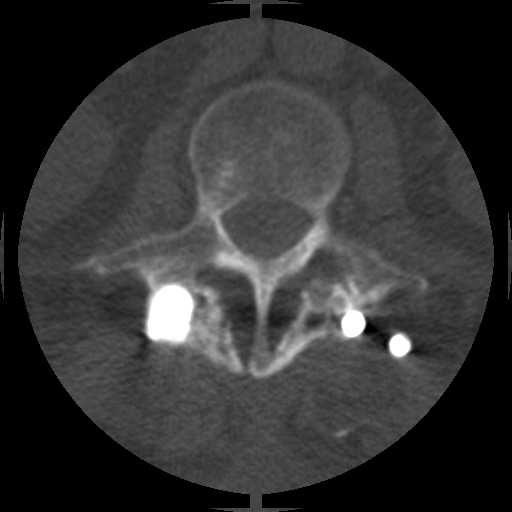

[Series 200: cor upper · coronal · 0.51mm/px · 1 of 59 slices shown]
[im 30/59  bone]
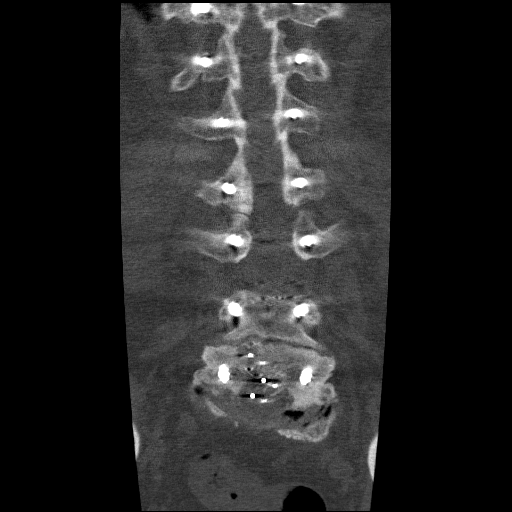

[Series 202: sag · sagittal · 0.51mm/px · 5 of 66 slices shown, 6 images]
[im 22/66  bone]
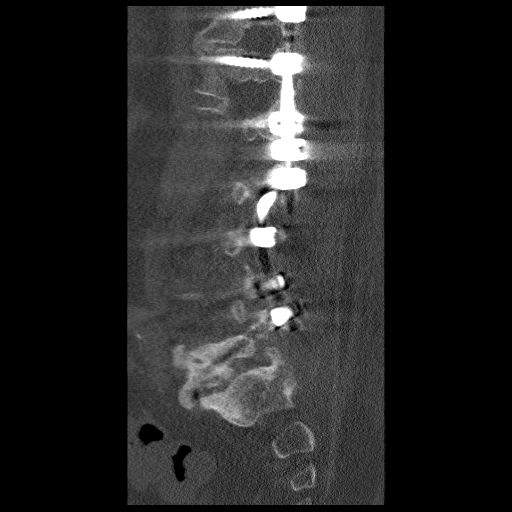
[im 28/66  bone]
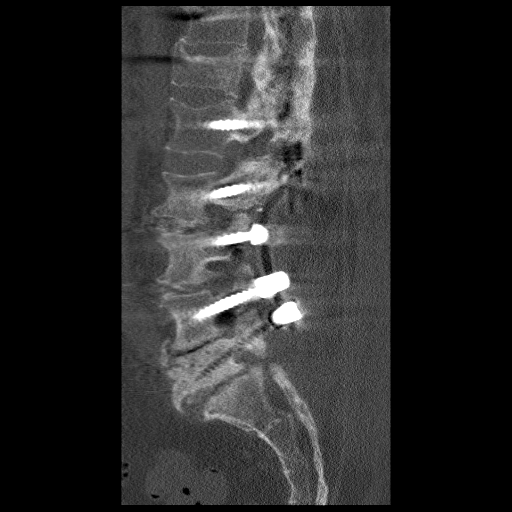
[im 33/66  soft-tissue]
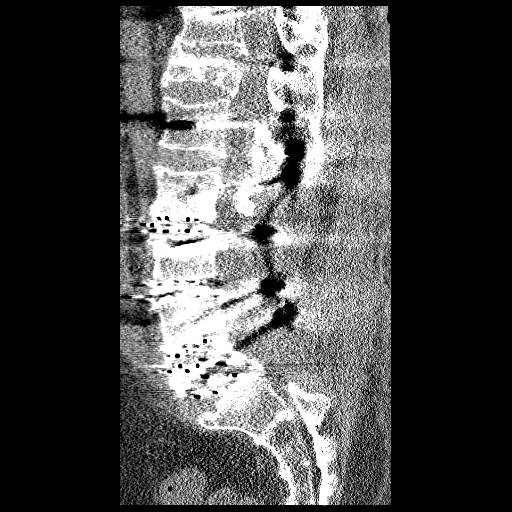
[im 33/66  bone]
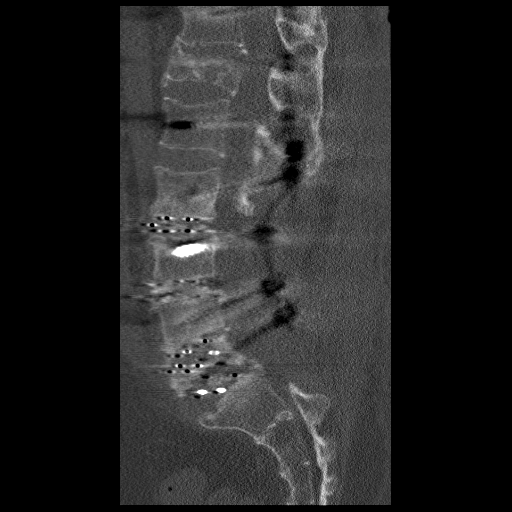
[im 38/66  bone]
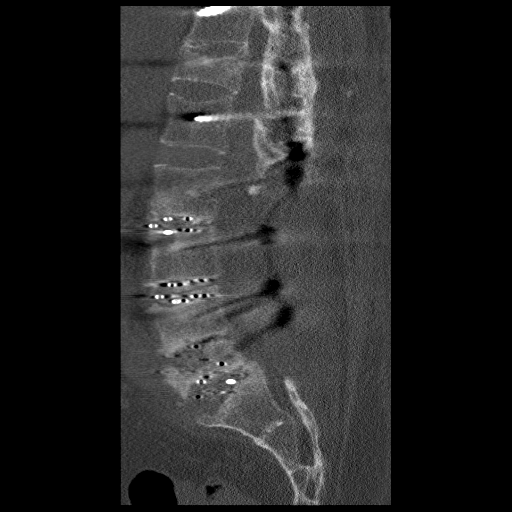
[im 44/66  bone]
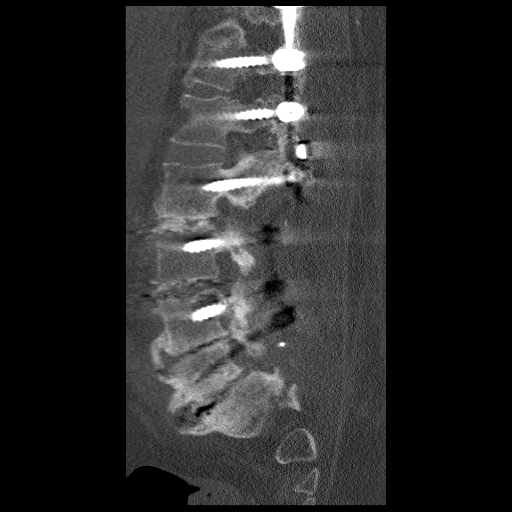

[11 of 33 positions shown; findings below may reference images not displayed]

FINDINGS: T11-12 through L1-2 are stable in appearance. Old compression
fracture of T12. Solid posterior fusion from T11 at L2. No residual
impingement.

L2-3: Interbody fusion appears solid. Excellent posterior
decompression with widely patent neural foramina. Hardware is
intact.

L3-4: There appears to be solid interbody fusion. Hardware is
intact. No residual impingement.

L4-5: Interbody fusion device has subsided further into the L5
vertebral body. There is air in the disc space with increased
sclerosis of the endplates. The appearance is consistent with non
union at this time. Hardware appears in good position. Weeks
posterior decompression. No visible neural impingement.

L5-S1: Interbody fusion device is in place, unchanged with
subsidence into the L5 vertebra, stable. There is new air in the
disc space consistent with nonunion. There is no solid bony
bridging. No focal neural impingement.
IMPRESSION: 1. Solid interbody fusion from T11-12 through L3-4 with no visible
neural impingement.
2. Nonunion of the interbody fusions at L4-5 and L5-S1 without
visible neural impingement.

## 2018-06-30 DEATH — deceased
